# Patient Record
Sex: Male | Born: 1945
Health system: Southern US, Community
[De-identification: ages and names within clinical notes are randomized; demographics above are authoritative.]

## PROBLEM LIST (undated history)

## (undated) DIAGNOSIS — I10 Essential (primary) hypertension: Secondary | ICD-10-CM

## (undated) DIAGNOSIS — Z952 Presence of prosthetic heart valve: Secondary | ICD-10-CM

## (undated) DIAGNOSIS — Z9889 Other specified postprocedural states: Secondary | ICD-10-CM

## (undated) DIAGNOSIS — K219 Gastro-esophageal reflux disease without esophagitis: Secondary | ICD-10-CM

## (undated) DIAGNOSIS — E785 Hyperlipidemia, unspecified: Secondary | ICD-10-CM

## (undated) DIAGNOSIS — N400 Enlarged prostate without lower urinary tract symptoms: Secondary | ICD-10-CM

## (undated) DIAGNOSIS — N189 Chronic kidney disease, unspecified: Secondary | ICD-10-CM

## (undated) DIAGNOSIS — R270 Ataxia, unspecified: Secondary | ICD-10-CM

## (undated) DIAGNOSIS — H8102 Meniere's disease, left ear: Secondary | ICD-10-CM

## (undated) DIAGNOSIS — F5104 Psychophysiologic insomnia: Secondary | ICD-10-CM

## (undated) DIAGNOSIS — F32A Depression, unspecified: Secondary | ICD-10-CM

## (undated) DIAGNOSIS — G459 Transient cerebral ischemic attack, unspecified: Secondary | ICD-10-CM

## (undated) DIAGNOSIS — R413 Other amnesia: Secondary | ICD-10-CM

## (undated) DIAGNOSIS — I4719 Other supraventricular tachycardia: Secondary | ICD-10-CM

## (undated) DIAGNOSIS — R42 Dizziness and giddiness: Secondary | ICD-10-CM

## (undated) DIAGNOSIS — I471 Supraventricular tachycardia: Secondary | ICD-10-CM

## (undated) DIAGNOSIS — I499 Cardiac arrhythmia, unspecified: Secondary | ICD-10-CM

## (undated) HISTORY — DX: Supraventricular tachycardia: I47.1

## (undated) HISTORY — DX: Psychophysiologic insomnia: F51.04

## (undated) HISTORY — PX: CARDIAC VALVE REPLACEMENT: SHX585

## (undated) HISTORY — DX: Other amnesia: R41.3

## (undated) HISTORY — DX: Ataxia, unspecified: R27.0

## (undated) HISTORY — PX: CHOLECYSTECTOMY: SHX55

## (undated) HISTORY — DX: Other supraventricular tachycardia: I47.19

## (undated) HISTORY — PX: ABLATION OF DYSRHYTHMIC FOCUS: SHX254

## (undated) HISTORY — DX: Meniere's disease, left ear: H81.02

## (undated) HISTORY — PX: KIDNEY STONE SURGERY: SHX686

## (undated) HISTORY — DX: Transient cerebral ischemic attack, unspecified: G45.9

## (undated) HISTORY — DX: Chronic kidney disease, unspecified: N18.9

## (undated) HISTORY — PX: KNEE ARTHROSCOPY: SUR90

## (undated) HISTORY — DX: Depression, unspecified: F32.A

---

## 2008-09-13 DIAGNOSIS — Z952 Presence of prosthetic heart valve: Secondary | ICD-10-CM

## 2008-09-13 HISTORY — DX: Presence of prosthetic heart valve: Z95.2

## 2009-07-16 ENCOUNTER — Ambulatory Visit: Payer: Self-pay | Admitting: Surgery

## 2009-07-29 ENCOUNTER — Ambulatory Visit: Payer: Self-pay | Admitting: Surgery

## 2014-09-12 DIAGNOSIS — J343 Hypertrophy of nasal turbinates: Secondary | ICD-10-CM

## 2014-09-12 DIAGNOSIS — J342 Deviated nasal septum: Secondary | ICD-10-CM | POA: Insufficient documentation

## 2014-09-12 DIAGNOSIS — J321 Chronic frontal sinusitis: Secondary | ICD-10-CM

## 2014-09-12 DIAGNOSIS — J323 Chronic sphenoidal sinusitis: Secondary | ICD-10-CM | POA: Insufficient documentation

## 2014-09-12 DIAGNOSIS — J339 Nasal polyp, unspecified: Secondary | ICD-10-CM

## 2014-09-12 HISTORY — DX: Hypertrophy of nasal turbinates: J34.3

## 2014-09-12 HISTORY — DX: Deviated nasal septum: J34.2

## 2014-09-12 HISTORY — DX: Chronic sphenoidal sinusitis: J32.3

## 2014-09-12 HISTORY — DX: Nasal polyp, unspecified: J33.9

## 2014-09-12 HISTORY — DX: Chronic frontal sinusitis: J32.1

## 2015-03-18 DIAGNOSIS — Z9889 Other specified postprocedural states: Secondary | ICD-10-CM

## 2015-03-18 DIAGNOSIS — I10 Essential (primary) hypertension: Secondary | ICD-10-CM

## 2015-03-18 DIAGNOSIS — E785 Hyperlipidemia, unspecified: Secondary | ICD-10-CM | POA: Insufficient documentation

## 2015-03-18 DIAGNOSIS — Z8679 Personal history of other diseases of the circulatory system: Secondary | ICD-10-CM

## 2015-03-18 DIAGNOSIS — I480 Paroxysmal atrial fibrillation: Secondary | ICD-10-CM | POA: Insufficient documentation

## 2015-03-18 HISTORY — DX: Other specified postprocedural states: Z98.890

## 2015-03-18 HISTORY — DX: Essential (primary) hypertension: I10

## 2015-03-18 HISTORY — DX: Personal history of other diseases of the circulatory system: Z86.79

## 2015-03-18 HISTORY — DX: Hyperlipidemia, unspecified: E78.5

## 2015-09-30 DIAGNOSIS — I480 Paroxysmal atrial fibrillation: Secondary | ICD-10-CM | POA: Diagnosis not present

## 2015-09-30 DIAGNOSIS — I10 Essential (primary) hypertension: Secondary | ICD-10-CM | POA: Diagnosis not present

## 2015-09-30 DIAGNOSIS — E785 Hyperlipidemia, unspecified: Secondary | ICD-10-CM | POA: Diagnosis not present

## 2015-09-30 DIAGNOSIS — Z952 Presence of prosthetic heart valve: Secondary | ICD-10-CM | POA: Diagnosis not present

## 2016-01-05 DIAGNOSIS — F5104 Psychophysiologic insomnia: Secondary | ICD-10-CM | POA: Diagnosis not present

## 2016-01-05 DIAGNOSIS — I10 Essential (primary) hypertension: Secondary | ICD-10-CM | POA: Diagnosis not present

## 2016-01-05 DIAGNOSIS — G2581 Restless legs syndrome: Secondary | ICD-10-CM | POA: Diagnosis not present

## 2016-01-05 DIAGNOSIS — R972 Elevated prostate specific antigen [PSA]: Secondary | ICD-10-CM | POA: Diagnosis not present

## 2016-01-05 DIAGNOSIS — I480 Paroxysmal atrial fibrillation: Secondary | ICD-10-CM | POA: Diagnosis not present

## 2016-01-05 DIAGNOSIS — E785 Hyperlipidemia, unspecified: Secondary | ICD-10-CM | POA: Diagnosis not present

## 2016-01-05 DIAGNOSIS — E663 Overweight: Secondary | ICD-10-CM | POA: Diagnosis not present

## 2016-01-05 DIAGNOSIS — Z79899 Other long term (current) drug therapy: Secondary | ICD-10-CM | POA: Diagnosis not present

## 2016-01-05 DIAGNOSIS — M170 Bilateral primary osteoarthritis of knee: Secondary | ICD-10-CM | POA: Diagnosis not present

## 2016-01-05 DIAGNOSIS — Z6829 Body mass index (BMI) 29.0-29.9, adult: Secondary | ICD-10-CM | POA: Diagnosis not present

## 2016-03-12 DIAGNOSIS — Z6829 Body mass index (BMI) 29.0-29.9, adult: Secondary | ICD-10-CM | POA: Diagnosis not present

## 2016-03-12 DIAGNOSIS — J309 Allergic rhinitis, unspecified: Secondary | ICD-10-CM | POA: Diagnosis not present

## 2016-03-12 DIAGNOSIS — I10 Essential (primary) hypertension: Secondary | ICD-10-CM | POA: Diagnosis not present

## 2016-03-29 DIAGNOSIS — I480 Paroxysmal atrial fibrillation: Secondary | ICD-10-CM | POA: Diagnosis not present

## 2016-03-29 DIAGNOSIS — I10 Essential (primary) hypertension: Secondary | ICD-10-CM | POA: Diagnosis not present

## 2016-03-29 DIAGNOSIS — Z952 Presence of prosthetic heart valve: Secondary | ICD-10-CM | POA: Diagnosis not present

## 2016-03-29 DIAGNOSIS — E785 Hyperlipidemia, unspecified: Secondary | ICD-10-CM | POA: Diagnosis not present

## 2016-05-27 DIAGNOSIS — H2513 Age-related nuclear cataract, bilateral: Secondary | ICD-10-CM | POA: Diagnosis not present

## 2016-05-27 DIAGNOSIS — H524 Presbyopia: Secondary | ICD-10-CM | POA: Diagnosis not present

## 2016-07-07 DIAGNOSIS — R972 Elevated prostate specific antigen [PSA]: Secondary | ICD-10-CM | POA: Diagnosis not present

## 2016-07-07 DIAGNOSIS — E785 Hyperlipidemia, unspecified: Secondary | ICD-10-CM | POA: Diagnosis not present

## 2016-07-07 DIAGNOSIS — Z1389 Encounter for screening for other disorder: Secondary | ICD-10-CM | POA: Diagnosis not present

## 2016-07-07 DIAGNOSIS — Z139 Encounter for screening, unspecified: Secondary | ICD-10-CM | POA: Diagnosis not present

## 2016-07-07 DIAGNOSIS — Z79899 Other long term (current) drug therapy: Secondary | ICD-10-CM | POA: Diagnosis not present

## 2016-07-07 DIAGNOSIS — Z9181 History of falling: Secondary | ICD-10-CM | POA: Diagnosis not present

## 2016-07-07 DIAGNOSIS — F5104 Psychophysiologic insomnia: Secondary | ICD-10-CM | POA: Diagnosis not present

## 2016-07-07 DIAGNOSIS — I10 Essential (primary) hypertension: Secondary | ICD-10-CM | POA: Diagnosis not present

## 2016-07-07 DIAGNOSIS — Z23 Encounter for immunization: Secondary | ICD-10-CM | POA: Diagnosis not present

## 2016-07-07 DIAGNOSIS — I480 Paroxysmal atrial fibrillation: Secondary | ICD-10-CM | POA: Diagnosis not present

## 2016-07-07 DIAGNOSIS — Z6829 Body mass index (BMI) 29.0-29.9, adult: Secondary | ICD-10-CM | POA: Diagnosis not present

## 2016-07-23 DIAGNOSIS — E785 Hyperlipidemia, unspecified: Secondary | ICD-10-CM | POA: Diagnosis not present

## 2016-07-23 DIAGNOSIS — I480 Paroxysmal atrial fibrillation: Secondary | ICD-10-CM | POA: Diagnosis not present

## 2016-07-23 DIAGNOSIS — I10 Essential (primary) hypertension: Secondary | ICD-10-CM | POA: Diagnosis not present

## 2016-07-23 DIAGNOSIS — R072 Precordial pain: Secondary | ICD-10-CM | POA: Diagnosis not present

## 2016-07-23 DIAGNOSIS — K219 Gastro-esophageal reflux disease without esophagitis: Secondary | ICD-10-CM | POA: Diagnosis not present

## 2016-07-23 DIAGNOSIS — Z952 Presence of prosthetic heart valve: Secondary | ICD-10-CM | POA: Diagnosis not present

## 2016-07-23 DIAGNOSIS — Z7902 Long term (current) use of antithrombotics/antiplatelets: Secondary | ICD-10-CM | POA: Diagnosis not present

## 2016-07-23 DIAGNOSIS — I482 Chronic atrial fibrillation: Secondary | ICD-10-CM | POA: Diagnosis not present

## 2016-07-23 DIAGNOSIS — I252 Old myocardial infarction: Secondary | ICD-10-CM | POA: Diagnosis not present

## 2016-07-23 DIAGNOSIS — R0789 Other chest pain: Secondary | ICD-10-CM | POA: Diagnosis not present

## 2016-07-23 DIAGNOSIS — Z951 Presence of aortocoronary bypass graft: Secondary | ICD-10-CM | POA: Diagnosis not present

## 2016-07-23 DIAGNOSIS — Z79899 Other long term (current) drug therapy: Secondary | ICD-10-CM | POA: Diagnosis not present

## 2016-07-23 DIAGNOSIS — R079 Chest pain, unspecified: Secondary | ICD-10-CM | POA: Diagnosis not present

## 2016-07-24 DIAGNOSIS — I10 Essential (primary) hypertension: Secondary | ICD-10-CM

## 2016-07-24 DIAGNOSIS — I480 Paroxysmal atrial fibrillation: Secondary | ICD-10-CM | POA: Diagnosis not present

## 2016-07-24 DIAGNOSIS — I482 Chronic atrial fibrillation: Secondary | ICD-10-CM | POA: Diagnosis not present

## 2016-07-24 DIAGNOSIS — R0789 Other chest pain: Secondary | ICD-10-CM | POA: Diagnosis not present

## 2016-07-24 DIAGNOSIS — E785 Hyperlipidemia, unspecified: Secondary | ICD-10-CM

## 2016-07-24 DIAGNOSIS — Z952 Presence of prosthetic heart valve: Secondary | ICD-10-CM | POA: Diagnosis not present

## 2016-07-24 DIAGNOSIS — Z951 Presence of aortocoronary bypass graft: Secondary | ICD-10-CM | POA: Diagnosis not present

## 2016-07-24 DIAGNOSIS — E784 Other hyperlipidemia: Secondary | ICD-10-CM | POA: Diagnosis not present

## 2016-07-24 DIAGNOSIS — I119 Hypertensive heart disease without heart failure: Secondary | ICD-10-CM | POA: Diagnosis not present

## 2016-07-24 DIAGNOSIS — R079 Chest pain, unspecified: Secondary | ICD-10-CM | POA: Diagnosis not present

## 2016-07-24 DIAGNOSIS — Z8249 Family history of ischemic heart disease and other diseases of the circulatory system: Secondary | ICD-10-CM | POA: Diagnosis not present

## 2016-07-25 DIAGNOSIS — Z952 Presence of prosthetic heart valve: Secondary | ICD-10-CM | POA: Diagnosis not present

## 2016-07-25 DIAGNOSIS — Z951 Presence of aortocoronary bypass graft: Secondary | ICD-10-CM | POA: Diagnosis not present

## 2016-07-25 DIAGNOSIS — R0789 Other chest pain: Secondary | ICD-10-CM | POA: Diagnosis not present

## 2016-07-25 DIAGNOSIS — I482 Chronic atrial fibrillation: Secondary | ICD-10-CM | POA: Diagnosis not present

## 2016-07-26 DIAGNOSIS — E784 Other hyperlipidemia: Secondary | ICD-10-CM | POA: Diagnosis not present

## 2016-07-26 DIAGNOSIS — Z79899 Other long term (current) drug therapy: Secondary | ICD-10-CM | POA: Diagnosis not present

## 2016-07-26 DIAGNOSIS — Z9049 Acquired absence of other specified parts of digestive tract: Secondary | ICD-10-CM | POA: Diagnosis not present

## 2016-07-26 DIAGNOSIS — I480 Paroxysmal atrial fibrillation: Secondary | ICD-10-CM | POA: Diagnosis not present

## 2016-07-26 DIAGNOSIS — I4891 Unspecified atrial fibrillation: Secondary | ICD-10-CM | POA: Diagnosis not present

## 2016-07-26 DIAGNOSIS — I119 Hypertensive heart disease without heart failure: Secondary | ICD-10-CM | POA: Diagnosis not present

## 2016-07-26 DIAGNOSIS — I481 Persistent atrial fibrillation: Secondary | ICD-10-CM | POA: Diagnosis not present

## 2016-07-26 DIAGNOSIS — Z954 Presence of other heart-valve replacement: Secondary | ICD-10-CM | POA: Diagnosis not present

## 2016-07-26 DIAGNOSIS — Z743 Need for continuous supervision: Secondary | ICD-10-CM | POA: Diagnosis not present

## 2016-07-26 DIAGNOSIS — Z7902 Long term (current) use of antithrombotics/antiplatelets: Secondary | ICD-10-CM | POA: Diagnosis not present

## 2016-07-26 DIAGNOSIS — I10 Essential (primary) hypertension: Secondary | ICD-10-CM | POA: Diagnosis not present

## 2016-07-26 DIAGNOSIS — R279 Unspecified lack of coordination: Secondary | ICD-10-CM | POA: Diagnosis not present

## 2016-07-26 DIAGNOSIS — Z952 Presence of prosthetic heart valve: Secondary | ICD-10-CM | POA: Diagnosis not present

## 2016-07-26 DIAGNOSIS — R0789 Other chest pain: Secondary | ICD-10-CM | POA: Diagnosis not present

## 2016-07-26 DIAGNOSIS — I4902 Ventricular flutter: Secondary | ICD-10-CM | POA: Diagnosis not present

## 2016-07-26 DIAGNOSIS — I484 Atypical atrial flutter: Secondary | ICD-10-CM | POA: Diagnosis not present

## 2016-07-26 DIAGNOSIS — I482 Chronic atrial fibrillation: Secondary | ICD-10-CM | POA: Diagnosis not present

## 2016-07-26 DIAGNOSIS — I471 Supraventricular tachycardia: Secondary | ICD-10-CM | POA: Diagnosis not present

## 2016-07-26 DIAGNOSIS — E785 Hyperlipidemia, unspecified: Secondary | ICD-10-CM | POA: Diagnosis not present

## 2016-07-26 DIAGNOSIS — Z951 Presence of aortocoronary bypass graft: Secondary | ICD-10-CM | POA: Diagnosis not present

## 2016-07-26 DIAGNOSIS — R079 Chest pain, unspecified: Secondary | ICD-10-CM | POA: Diagnosis not present

## 2016-07-26 DIAGNOSIS — Z9889 Other specified postprocedural states: Secondary | ICD-10-CM | POA: Diagnosis not present

## 2016-07-27 DIAGNOSIS — I471 Supraventricular tachycardia: Secondary | ICD-10-CM | POA: Diagnosis not present

## 2016-07-27 DIAGNOSIS — Z7902 Long term (current) use of antithrombotics/antiplatelets: Secondary | ICD-10-CM | POA: Diagnosis not present

## 2016-07-27 DIAGNOSIS — I484 Atypical atrial flutter: Secondary | ICD-10-CM | POA: Diagnosis not present

## 2016-07-27 DIAGNOSIS — I10 Essential (primary) hypertension: Secondary | ICD-10-CM | POA: Diagnosis not present

## 2016-07-27 DIAGNOSIS — E785 Hyperlipidemia, unspecified: Secondary | ICD-10-CM | POA: Diagnosis not present

## 2016-07-27 DIAGNOSIS — Z9889 Other specified postprocedural states: Secondary | ICD-10-CM | POA: Diagnosis not present

## 2016-07-27 DIAGNOSIS — Z952 Presence of prosthetic heart valve: Secondary | ICD-10-CM | POA: Diagnosis not present

## 2016-07-27 DIAGNOSIS — Z9049 Acquired absence of other specified parts of digestive tract: Secondary | ICD-10-CM | POA: Diagnosis not present

## 2016-07-27 DIAGNOSIS — R079 Chest pain, unspecified: Secondary | ICD-10-CM | POA: Diagnosis not present

## 2016-07-27 DIAGNOSIS — I4902 Ventricular flutter: Secondary | ICD-10-CM | POA: Diagnosis not present

## 2016-07-27 DIAGNOSIS — Z79899 Other long term (current) drug therapy: Secondary | ICD-10-CM | POA: Diagnosis not present

## 2016-07-27 DIAGNOSIS — I4891 Unspecified atrial fibrillation: Secondary | ICD-10-CM | POA: Diagnosis not present

## 2016-07-28 DIAGNOSIS — I119 Hypertensive heart disease without heart failure: Secondary | ICD-10-CM | POA: Diagnosis not present

## 2016-07-28 DIAGNOSIS — I4891 Unspecified atrial fibrillation: Secondary | ICD-10-CM | POA: Diagnosis not present

## 2016-07-28 DIAGNOSIS — I471 Supraventricular tachycardia: Secondary | ICD-10-CM | POA: Diagnosis not present

## 2016-07-28 DIAGNOSIS — I358 Other nonrheumatic aortic valve disorders: Secondary | ICD-10-CM | POA: Diagnosis not present

## 2016-07-28 DIAGNOSIS — Z954 Presence of other heart-valve replacement: Secondary | ICD-10-CM | POA: Diagnosis not present

## 2016-07-28 DIAGNOSIS — E784 Other hyperlipidemia: Secondary | ICD-10-CM | POA: Diagnosis not present

## 2016-07-28 DIAGNOSIS — R000 Tachycardia, unspecified: Secondary | ICD-10-CM | POA: Diagnosis not present

## 2016-08-01 DIAGNOSIS — R5383 Other fatigue: Secondary | ICD-10-CM | POA: Diagnosis not present

## 2016-08-01 DIAGNOSIS — I480 Paroxysmal atrial fibrillation: Secondary | ICD-10-CM | POA: Diagnosis not present

## 2016-08-01 DIAGNOSIS — R0789 Other chest pain: Secondary | ICD-10-CM | POA: Diagnosis not present

## 2016-08-01 DIAGNOSIS — R001 Bradycardia, unspecified: Secondary | ICD-10-CM | POA: Diagnosis not present

## 2016-08-01 DIAGNOSIS — R002 Palpitations: Secondary | ICD-10-CM | POA: Diagnosis not present

## 2016-08-01 DIAGNOSIS — R079 Chest pain, unspecified: Secondary | ICD-10-CM | POA: Diagnosis not present

## 2016-08-01 DIAGNOSIS — Z952 Presence of prosthetic heart valve: Secondary | ICD-10-CM | POA: Diagnosis not present

## 2016-08-01 DIAGNOSIS — Z953 Presence of xenogenic heart valve: Secondary | ICD-10-CM | POA: Diagnosis not present

## 2016-08-01 DIAGNOSIS — E785 Hyperlipidemia, unspecified: Secondary | ICD-10-CM | POA: Diagnosis not present

## 2016-08-01 DIAGNOSIS — Z7901 Long term (current) use of anticoagulants: Secondary | ICD-10-CM | POA: Diagnosis not present

## 2016-08-01 DIAGNOSIS — I10 Essential (primary) hypertension: Secondary | ICD-10-CM | POA: Diagnosis not present

## 2016-08-01 DIAGNOSIS — Z79899 Other long term (current) drug therapy: Secondary | ICD-10-CM | POA: Diagnosis not present

## 2016-08-01 DIAGNOSIS — I4891 Unspecified atrial fibrillation: Secondary | ICD-10-CM | POA: Diagnosis not present

## 2016-08-01 DIAGNOSIS — R748 Abnormal levels of other serum enzymes: Secondary | ICD-10-CM | POA: Diagnosis not present

## 2016-08-01 DIAGNOSIS — R7989 Other specified abnormal findings of blood chemistry: Secondary | ICD-10-CM | POA: Diagnosis not present

## 2016-08-01 DIAGNOSIS — I471 Supraventricular tachycardia: Secondary | ICD-10-CM | POA: Diagnosis not present

## 2016-08-02 DIAGNOSIS — I480 Paroxysmal atrial fibrillation: Secondary | ICD-10-CM | POA: Diagnosis not present

## 2016-08-02 DIAGNOSIS — R079 Chest pain, unspecified: Secondary | ICD-10-CM | POA: Diagnosis not present

## 2016-08-02 DIAGNOSIS — I4891 Unspecified atrial fibrillation: Secondary | ICD-10-CM | POA: Diagnosis not present

## 2016-08-02 DIAGNOSIS — I10 Essential (primary) hypertension: Secondary | ICD-10-CM | POA: Diagnosis not present

## 2016-08-02 DIAGNOSIS — E785 Hyperlipidemia, unspecified: Secondary | ICD-10-CM | POA: Diagnosis not present

## 2016-08-02 DIAGNOSIS — Z952 Presence of prosthetic heart valve: Secondary | ICD-10-CM | POA: Diagnosis not present

## 2016-08-03 DIAGNOSIS — I480 Paroxysmal atrial fibrillation: Secondary | ICD-10-CM | POA: Diagnosis not present

## 2016-08-03 DIAGNOSIS — R079 Chest pain, unspecified: Secondary | ICD-10-CM | POA: Diagnosis not present

## 2016-08-03 DIAGNOSIS — Z952 Presence of prosthetic heart valve: Secondary | ICD-10-CM | POA: Diagnosis not present

## 2016-08-03 DIAGNOSIS — I4891 Unspecified atrial fibrillation: Secondary | ICD-10-CM | POA: Diagnosis not present

## 2016-08-03 DIAGNOSIS — E785 Hyperlipidemia, unspecified: Secondary | ICD-10-CM | POA: Diagnosis not present

## 2016-08-03 DIAGNOSIS — I10 Essential (primary) hypertension: Secondary | ICD-10-CM | POA: Diagnosis not present

## 2016-08-06 DIAGNOSIS — Z952 Presence of prosthetic heart valve: Secondary | ICD-10-CM | POA: Diagnosis not present

## 2016-08-06 DIAGNOSIS — I4891 Unspecified atrial fibrillation: Secondary | ICD-10-CM | POA: Diagnosis not present

## 2016-08-06 DIAGNOSIS — R079 Chest pain, unspecified: Secondary | ICD-10-CM | POA: Diagnosis not present

## 2016-08-06 DIAGNOSIS — R0789 Other chest pain: Secondary | ICD-10-CM | POA: Diagnosis not present

## 2016-08-06 DIAGNOSIS — E876 Hypokalemia: Secondary | ICD-10-CM | POA: Diagnosis not present

## 2016-08-06 DIAGNOSIS — R072 Precordial pain: Secondary | ICD-10-CM | POA: Diagnosis not present

## 2016-08-06 DIAGNOSIS — I10 Essential (primary) hypertension: Secondary | ICD-10-CM | POA: Diagnosis not present

## 2016-08-06 DIAGNOSIS — Z79899 Other long term (current) drug therapy: Secondary | ICD-10-CM | POA: Diagnosis not present

## 2016-08-06 DIAGNOSIS — E875 Hyperkalemia: Secondary | ICD-10-CM

## 2016-08-06 DIAGNOSIS — I480 Paroxysmal atrial fibrillation: Secondary | ICD-10-CM | POA: Diagnosis not present

## 2016-08-06 DIAGNOSIS — E785 Hyperlipidemia, unspecified: Secondary | ICD-10-CM | POA: Diagnosis not present

## 2016-08-06 DIAGNOSIS — I252 Old myocardial infarction: Secondary | ICD-10-CM | POA: Diagnosis not present

## 2016-08-06 DIAGNOSIS — I358 Other nonrheumatic aortic valve disorders: Secondary | ICD-10-CM | POA: Diagnosis not present

## 2016-08-06 DIAGNOSIS — M1990 Unspecified osteoarthritis, unspecified site: Secondary | ICD-10-CM | POA: Diagnosis not present

## 2016-08-07 DIAGNOSIS — R0789 Other chest pain: Secondary | ICD-10-CM | POA: Diagnosis not present

## 2016-08-07 DIAGNOSIS — I480 Paroxysmal atrial fibrillation: Secondary | ICD-10-CM | POA: Diagnosis not present

## 2016-08-07 DIAGNOSIS — Z952 Presence of prosthetic heart valve: Secondary | ICD-10-CM | POA: Diagnosis not present

## 2016-08-07 DIAGNOSIS — R079 Chest pain, unspecified: Secondary | ICD-10-CM | POA: Diagnosis not present

## 2016-08-13 DIAGNOSIS — Z9889 Other specified postprocedural states: Secondary | ICD-10-CM

## 2016-08-13 HISTORY — DX: Other specified postprocedural states: Z98.890

## 2016-08-16 DIAGNOSIS — I10 Essential (primary) hypertension: Secondary | ICD-10-CM | POA: Diagnosis not present

## 2016-08-16 DIAGNOSIS — R001 Bradycardia, unspecified: Secondary | ICD-10-CM | POA: Diagnosis not present

## 2016-08-16 DIAGNOSIS — Z952 Presence of prosthetic heart valve: Secondary | ICD-10-CM | POA: Diagnosis not present

## 2016-08-16 DIAGNOSIS — I480 Paroxysmal atrial fibrillation: Secondary | ICD-10-CM | POA: Diagnosis not present

## 2016-08-16 DIAGNOSIS — E785 Hyperlipidemia, unspecified: Secondary | ICD-10-CM | POA: Diagnosis not present

## 2016-08-16 DIAGNOSIS — I471 Supraventricular tachycardia: Secondary | ICD-10-CM | POA: Diagnosis not present

## 2016-08-30 DIAGNOSIS — Q211 Atrial septal defect: Secondary | ICD-10-CM | POA: Diagnosis not present

## 2016-08-30 DIAGNOSIS — I358 Other nonrheumatic aortic valve disorders: Secondary | ICD-10-CM | POA: Diagnosis not present

## 2016-08-30 DIAGNOSIS — I480 Paroxysmal atrial fibrillation: Secondary | ICD-10-CM | POA: Diagnosis not present

## 2016-08-30 DIAGNOSIS — I471 Supraventricular tachycardia: Secondary | ICD-10-CM | POA: Diagnosis not present

## 2016-08-30 DIAGNOSIS — Z01818 Encounter for other preprocedural examination: Secondary | ICD-10-CM | POA: Diagnosis not present

## 2016-08-31 DIAGNOSIS — I471 Supraventricular tachycardia: Secondary | ICD-10-CM | POA: Diagnosis not present

## 2016-08-31 DIAGNOSIS — Z79899 Other long term (current) drug therapy: Secondary | ICD-10-CM | POA: Diagnosis not present

## 2016-08-31 DIAGNOSIS — I480 Paroxysmal atrial fibrillation: Secondary | ICD-10-CM | POA: Diagnosis not present

## 2016-08-31 DIAGNOSIS — E785 Hyperlipidemia, unspecified: Secondary | ICD-10-CM | POA: Diagnosis not present

## 2016-08-31 DIAGNOSIS — Z7901 Long term (current) use of anticoagulants: Secondary | ICD-10-CM | POA: Diagnosis not present

## 2016-08-31 DIAGNOSIS — I10 Essential (primary) hypertension: Secondary | ICD-10-CM | POA: Diagnosis not present

## 2016-08-31 DIAGNOSIS — R001 Bradycardia, unspecified: Secondary | ICD-10-CM | POA: Diagnosis not present

## 2016-09-01 DIAGNOSIS — I471 Supraventricular tachycardia: Secondary | ICD-10-CM | POA: Diagnosis not present

## 2016-10-07 DIAGNOSIS — I10 Essential (primary) hypertension: Secondary | ICD-10-CM | POA: Diagnosis not present

## 2016-10-07 DIAGNOSIS — Z8679 Personal history of other diseases of the circulatory system: Secondary | ICD-10-CM | POA: Diagnosis not present

## 2016-10-07 DIAGNOSIS — Z952 Presence of prosthetic heart valve: Secondary | ICD-10-CM | POA: Diagnosis not present

## 2016-10-07 DIAGNOSIS — I471 Supraventricular tachycardia: Secondary | ICD-10-CM | POA: Diagnosis not present

## 2016-10-07 DIAGNOSIS — E785 Hyperlipidemia, unspecified: Secondary | ICD-10-CM | POA: Diagnosis not present

## 2016-10-07 DIAGNOSIS — Z9889 Other specified postprocedural states: Secondary | ICD-10-CM | POA: Diagnosis not present

## 2016-11-20 DIAGNOSIS — B359 Dermatophytosis, unspecified: Secondary | ICD-10-CM | POA: Diagnosis not present

## 2016-11-20 DIAGNOSIS — J019 Acute sinusitis, unspecified: Secondary | ICD-10-CM | POA: Diagnosis not present

## 2017-01-08 DIAGNOSIS — J019 Acute sinusitis, unspecified: Secondary | ICD-10-CM | POA: Diagnosis not present

## 2017-01-08 DIAGNOSIS — Z6829 Body mass index (BMI) 29.0-29.9, adult: Secondary | ICD-10-CM | POA: Diagnosis not present

## 2017-01-08 DIAGNOSIS — F5104 Psychophysiologic insomnia: Secondary | ICD-10-CM | POA: Diagnosis not present

## 2017-01-08 DIAGNOSIS — R972 Elevated prostate specific antigen [PSA]: Secondary | ICD-10-CM | POA: Diagnosis not present

## 2017-01-08 DIAGNOSIS — E785 Hyperlipidemia, unspecified: Secondary | ICD-10-CM | POA: Diagnosis not present

## 2017-01-08 DIAGNOSIS — I480 Paroxysmal atrial fibrillation: Secondary | ICD-10-CM | POA: Diagnosis not present

## 2017-01-08 DIAGNOSIS — Z79899 Other long term (current) drug therapy: Secondary | ICD-10-CM | POA: Diagnosis not present

## 2017-01-08 DIAGNOSIS — I10 Essential (primary) hypertension: Secondary | ICD-10-CM | POA: Diagnosis not present

## 2017-01-10 DIAGNOSIS — E785 Hyperlipidemia, unspecified: Secondary | ICD-10-CM | POA: Diagnosis not present

## 2017-01-10 DIAGNOSIS — Z79899 Other long term (current) drug therapy: Secondary | ICD-10-CM | POA: Diagnosis not present

## 2017-01-10 DIAGNOSIS — R972 Elevated prostate specific antigen [PSA]: Secondary | ICD-10-CM | POA: Diagnosis not present

## 2017-01-11 DIAGNOSIS — I480 Paroxysmal atrial fibrillation: Secondary | ICD-10-CM | POA: Diagnosis not present

## 2017-01-11 DIAGNOSIS — Z8679 Personal history of other diseases of the circulatory system: Secondary | ICD-10-CM | POA: Diagnosis not present

## 2017-01-11 DIAGNOSIS — I471 Supraventricular tachycardia: Secondary | ICD-10-CM | POA: Diagnosis not present

## 2017-01-11 DIAGNOSIS — Z9889 Other specified postprocedural states: Secondary | ICD-10-CM | POA: Diagnosis not present

## 2017-01-11 DIAGNOSIS — I10 Essential (primary) hypertension: Secondary | ICD-10-CM | POA: Diagnosis not present

## 2017-01-11 DIAGNOSIS — E785 Hyperlipidemia, unspecified: Secondary | ICD-10-CM | POA: Diagnosis not present

## 2017-03-22 DIAGNOSIS — Z0000 Encounter for general adult medical examination without abnormal findings: Secondary | ICD-10-CM | POA: Diagnosis not present

## 2017-03-22 DIAGNOSIS — Z136 Encounter for screening for cardiovascular disorders: Secondary | ICD-10-CM | POA: Diagnosis not present

## 2017-03-22 DIAGNOSIS — Z9181 History of falling: Secondary | ICD-10-CM | POA: Diagnosis not present

## 2017-03-22 DIAGNOSIS — Z1389 Encounter for screening for other disorder: Secondary | ICD-10-CM | POA: Diagnosis not present

## 2017-03-22 DIAGNOSIS — Z125 Encounter for screening for malignant neoplasm of prostate: Secondary | ICD-10-CM | POA: Diagnosis not present

## 2017-03-22 DIAGNOSIS — E785 Hyperlipidemia, unspecified: Secondary | ICD-10-CM | POA: Diagnosis not present

## 2017-05-05 DIAGNOSIS — Z6829 Body mass index (BMI) 29.0-29.9, adult: Secondary | ICD-10-CM | POA: Diagnosis not present

## 2017-05-05 DIAGNOSIS — R05 Cough: Secondary | ICD-10-CM | POA: Diagnosis not present

## 2017-05-05 DIAGNOSIS — R0602 Shortness of breath: Secondary | ICD-10-CM | POA: Diagnosis not present

## 2017-05-05 DIAGNOSIS — J208 Acute bronchitis due to other specified organisms: Secondary | ICD-10-CM | POA: Diagnosis not present

## 2017-05-05 DIAGNOSIS — M159 Polyosteoarthritis, unspecified: Secondary | ICD-10-CM | POA: Diagnosis not present

## 2017-05-23 DIAGNOSIS — I10 Essential (primary) hypertension: Secondary | ICD-10-CM | POA: Diagnosis not present

## 2017-05-23 DIAGNOSIS — R413 Other amnesia: Secondary | ICD-10-CM | POA: Diagnosis not present

## 2017-05-23 DIAGNOSIS — F40240 Claustrophobia: Secondary | ICD-10-CM | POA: Diagnosis not present

## 2017-05-23 DIAGNOSIS — G459 Transient cerebral ischemic attack, unspecified: Secondary | ICD-10-CM | POA: Diagnosis not present

## 2017-05-23 DIAGNOSIS — Z6828 Body mass index (BMI) 28.0-28.9, adult: Secondary | ICD-10-CM | POA: Diagnosis not present

## 2017-05-23 DIAGNOSIS — I480 Paroxysmal atrial fibrillation: Secondary | ICD-10-CM | POA: Diagnosis not present

## 2017-05-31 DIAGNOSIS — I639 Cerebral infarction, unspecified: Secondary | ICD-10-CM | POA: Diagnosis not present

## 2017-05-31 DIAGNOSIS — G459 Transient cerebral ischemic attack, unspecified: Secondary | ICD-10-CM | POA: Diagnosis not present

## 2017-06-02 DIAGNOSIS — G459 Transient cerebral ischemic attack, unspecified: Secondary | ICD-10-CM | POA: Diagnosis not present

## 2017-06-02 DIAGNOSIS — R413 Other amnesia: Secondary | ICD-10-CM | POA: Diagnosis not present

## 2017-06-03 DIAGNOSIS — Z952 Presence of prosthetic heart valve: Secondary | ICD-10-CM | POA: Diagnosis not present

## 2017-06-03 DIAGNOSIS — R9431 Abnormal electrocardiogram [ECG] [EKG]: Secondary | ICD-10-CM | POA: Diagnosis not present

## 2017-06-03 DIAGNOSIS — E7800 Pure hypercholesterolemia, unspecified: Secondary | ICD-10-CM | POA: Insufficient documentation

## 2017-06-03 DIAGNOSIS — I4891 Unspecified atrial fibrillation: Secondary | ICD-10-CM | POA: Diagnosis not present

## 2017-06-03 DIAGNOSIS — I10 Essential (primary) hypertension: Secondary | ICD-10-CM | POA: Diagnosis not present

## 2017-06-03 DIAGNOSIS — Z9889 Other specified postprocedural states: Secondary | ICD-10-CM | POA: Diagnosis not present

## 2017-06-06 DIAGNOSIS — R0981 Nasal congestion: Secondary | ICD-10-CM | POA: Diagnosis not present

## 2017-06-06 DIAGNOSIS — E538 Deficiency of other specified B group vitamins: Secondary | ICD-10-CM | POA: Diagnosis not present

## 2017-06-06 DIAGNOSIS — R413 Other amnesia: Secondary | ICD-10-CM | POA: Diagnosis not present

## 2017-06-06 DIAGNOSIS — R5381 Other malaise: Secondary | ICD-10-CM | POA: Diagnosis not present

## 2017-06-06 DIAGNOSIS — Z6828 Body mass index (BMI) 28.0-28.9, adult: Secondary | ICD-10-CM | POA: Diagnosis not present

## 2017-06-07 DIAGNOSIS — R0982 Postnasal drip: Secondary | ICD-10-CM | POA: Diagnosis not present

## 2017-06-07 DIAGNOSIS — R42 Dizziness and giddiness: Secondary | ICD-10-CM | POA: Diagnosis not present

## 2017-06-07 DIAGNOSIS — H903 Sensorineural hearing loss, bilateral: Secondary | ICD-10-CM | POA: Diagnosis not present

## 2017-06-24 DIAGNOSIS — I517 Cardiomegaly: Secondary | ICD-10-CM | POA: Diagnosis not present

## 2017-06-24 DIAGNOSIS — I351 Nonrheumatic aortic (valve) insufficiency: Secondary | ICD-10-CM | POA: Diagnosis not present

## 2017-07-11 DIAGNOSIS — R42 Dizziness and giddiness: Secondary | ICD-10-CM | POA: Diagnosis not present

## 2017-07-14 DIAGNOSIS — E785 Hyperlipidemia, unspecified: Secondary | ICD-10-CM | POA: Diagnosis not present

## 2017-07-14 DIAGNOSIS — I480 Paroxysmal atrial fibrillation: Secondary | ICD-10-CM | POA: Diagnosis not present

## 2017-07-14 DIAGNOSIS — Z139 Encounter for screening, unspecified: Secondary | ICD-10-CM | POA: Diagnosis not present

## 2017-07-14 DIAGNOSIS — Z6829 Body mass index (BMI) 29.0-29.9, adult: Secondary | ICD-10-CM | POA: Diagnosis not present

## 2017-07-14 DIAGNOSIS — I10 Essential (primary) hypertension: Secondary | ICD-10-CM | POA: Diagnosis not present

## 2017-07-14 DIAGNOSIS — R972 Elevated prostate specific antigen [PSA]: Secondary | ICD-10-CM | POA: Diagnosis not present

## 2017-07-14 DIAGNOSIS — F5104 Psychophysiologic insomnia: Secondary | ICD-10-CM | POA: Diagnosis not present

## 2017-07-14 DIAGNOSIS — Z79899 Other long term (current) drug therapy: Secondary | ICD-10-CM | POA: Diagnosis not present

## 2017-07-14 DIAGNOSIS — Z23 Encounter for immunization: Secondary | ICD-10-CM | POA: Diagnosis not present

## 2017-07-15 ENCOUNTER — Telehealth: Payer: Self-pay

## 2017-07-15 NOTE — Telephone Encounter (Signed)
Lab results came in from pcp. Attempted to get pt in for routine follow up appt pt hasn't seen Dr. Geraldo Pitter in new office.

## 2017-07-19 DIAGNOSIS — K219 Gastro-esophageal reflux disease without esophagitis: Secondary | ICD-10-CM | POA: Diagnosis not present

## 2017-07-19 DIAGNOSIS — H903 Sensorineural hearing loss, bilateral: Secondary | ICD-10-CM | POA: Diagnosis not present

## 2017-07-19 DIAGNOSIS — R0982 Postnasal drip: Secondary | ICD-10-CM | POA: Diagnosis not present

## 2017-07-19 DIAGNOSIS — R42 Dizziness and giddiness: Secondary | ICD-10-CM | POA: Diagnosis not present

## 2017-07-22 DIAGNOSIS — N401 Enlarged prostate with lower urinary tract symptoms: Secondary | ICD-10-CM | POA: Diagnosis not present

## 2017-07-22 DIAGNOSIS — R972 Elevated prostate specific antigen [PSA]: Secondary | ICD-10-CM | POA: Diagnosis not present

## 2017-07-22 DIAGNOSIS — R21 Rash and other nonspecific skin eruption: Secondary | ICD-10-CM | POA: Diagnosis not present

## 2017-07-28 DIAGNOSIS — D2361 Other benign neoplasm of skin of right upper limb, including shoulder: Secondary | ICD-10-CM | POA: Diagnosis not present

## 2017-07-28 DIAGNOSIS — L300 Nummular dermatitis: Secondary | ICD-10-CM | POA: Diagnosis not present

## 2017-07-28 DIAGNOSIS — L570 Actinic keratosis: Secondary | ICD-10-CM | POA: Diagnosis not present

## 2017-07-29 DIAGNOSIS — Z6829 Body mass index (BMI) 29.0-29.9, adult: Secondary | ICD-10-CM | POA: Diagnosis not present

## 2017-07-29 DIAGNOSIS — I10 Essential (primary) hypertension: Secondary | ICD-10-CM | POA: Diagnosis not present

## 2017-08-05 DIAGNOSIS — Z139 Encounter for screening, unspecified: Secondary | ICD-10-CM | POA: Diagnosis not present

## 2017-08-05 DIAGNOSIS — I10 Essential (primary) hypertension: Secondary | ICD-10-CM | POA: Diagnosis not present

## 2017-08-05 DIAGNOSIS — Z6829 Body mass index (BMI) 29.0-29.9, adult: Secondary | ICD-10-CM | POA: Diagnosis not present

## 2017-08-24 ENCOUNTER — Other Ambulatory Visit: Payer: Self-pay | Admitting: *Deleted

## 2017-08-24 ENCOUNTER — Other Ambulatory Visit: Payer: Self-pay

## 2017-08-26 ENCOUNTER — Encounter: Payer: Self-pay | Admitting: Cardiology

## 2017-08-26 ENCOUNTER — Ambulatory Visit: Payer: PPO | Admitting: Cardiology

## 2017-08-26 VITALS — BP 102/60 | HR 75 | Ht 68.5 in | Wt 190.0 lb

## 2017-08-26 DIAGNOSIS — Z9889 Other specified postprocedural states: Secondary | ICD-10-CM | POA: Diagnosis not present

## 2017-08-26 DIAGNOSIS — Z952 Presence of prosthetic heart valve: Secondary | ICD-10-CM | POA: Diagnosis not present

## 2017-08-26 DIAGNOSIS — I480 Paroxysmal atrial fibrillation: Secondary | ICD-10-CM

## 2017-08-26 DIAGNOSIS — E7800 Pure hypercholesterolemia, unspecified: Secondary | ICD-10-CM

## 2017-08-26 DIAGNOSIS — I10 Essential (primary) hypertension: Secondary | ICD-10-CM

## 2017-08-26 DIAGNOSIS — Z8679 Personal history of other diseases of the circulatory system: Secondary | ICD-10-CM

## 2017-08-26 DIAGNOSIS — E785 Hyperlipidemia, unspecified: Secondary | ICD-10-CM | POA: Diagnosis not present

## 2017-08-26 NOTE — Patient Instructions (Addendum)
Medication Instructions:  Your physician has recommended you make the following change in your medication: STOP Lisinopril  Labwork: None  Testing/Procedures: You had an EKG  Follow-Up: Your physician recommends that you schedule a follow-up appointment in: 6 months  Any Other Special Instructions Will Be Listed Below (If Applicable).     If you need a refill on your cardiac medications before your next appointment, please call your pharmacy.   Frederic, RN, BSN

## 2017-08-26 NOTE — Progress Notes (Signed)
Cardiology Office Note:    Date:  08/26/2017   ID:  Randall Edwards, DOB Nov 09, 1945, MRN [id number]  PCP:  Randall Dress, MD  Cardiologist:  Randall Lindau, MD   Referring MD: Randall Dress, MD    ASSESSMENT:    1. S/P AVR (aortic valve replacement)   2. Status post radiofrequency ablation for arrhythmia   3. Hypercholesteremia   4. Dyslipidemia   5. Paroxysmal atrial fibrillation (HCC)   6. Essential hypertension    PLAN:    In order of problems listed above:  1. Secondary prevention stressed with the patient.  Importance of compliance with diet and medications stressed and he vocalized understanding.  He tells me that his primary care physician has done his blood work including lipids and they are followed by primary care. 2. Blood pressure is very borderline and on another was 90/70.  He is asymptomatic but will view of the above I will stop his lisinopril and told him to monitor his blood pressure on a regular basis.  I would presume that his ambulatory and resting blood pressure would be fine at home.  I am little cautious about keeping his blood pressure so low for fear of adverse events.  Next 3. His aortic valve clinically appears to be fine I would like to obtain the echocardiogram report and carotid Doppler report from [facility]. 4. Patient will be seen in follow-up appointment in 6 months or earlier if the patient has any concerns. I discussed my findings at length with the patient's electrophysiologist Dr. Deatra Edwards.  He feels that the patient has only atrial tachycardia and not paroxysmal atrial fibrillation.  Patient has had one sporadic episode of palpitations which have resolved with an extra dose of sotalol.  Therefore patient will continue current medications and we will send a copy of this note to his electrophysiologist for review.  Total time for this evaluation was 60 minutes.   Medication Adjustments/Labs and Tests Ordered: Current medicines are  reviewed at length with the patient today.  Concerns regarding medicines are outlined above.  No orders of the defined types were placed in this encounter.  No orders of the defined types were placed in this encounter.    Chief Complaint  Patient presents with  . Follow-up     History of Present Illness:    Randall Edwards is a 71 y.o. male.  The patient has past medical history of essential hypertension, atrial tachycardia post ablation, aortic valve replacement and dyslipidemia.  He is seen here for appointment for these issues.  This patient has been under my care in my previous practice.  He is here now to transfer his care and be established with my current practice.  He denies any palpitations.  No chest pain orthopnea or PND.  No history of syncope.  At the time of my evaluation he is alert awake oriented and in no distress.  He walks on a regular basis about 30 minutes a day.  He denies any shortness of breath.  History reviewed. No pertinent past medical history.  History reviewed. No pertinent surgical history.  Current Medications: Current Meds  Medication Sig  . acetaminophen (TYLENOL) 650 MG CR tablet Take 650 mg by mouth.  Marland Kitchen aspirin EC 81 MG tablet Take 81 mg by mouth.  Marland Kitchen lisinopril (PRINIVIL,ZESTRIL) 20 MG tablet Take 20 mg by mouth at bedtime.  Marland Kitchen omeprazole (PRILOSEC) 20 MG capsule Take 20 mg by mouth.  . rosuvastatin (CRESTOR) 10  MG tablet Take 10 mg by mouth.  . sotalol (BETAPACE) 80 MG tablet TAKE ONE-HALF TABLET BY MOUTH EVERY MORNING and nightly  . tamsulosin (FLOMAX) 0.4 MG CAPS capsule TAKE ONE CAPSULE BY MOUTH EVERY DAY ONE-HALF hour after SUPPER  . temazepam (RESTORIL) 15 MG capsule TAKE ONE CAPSULE BY MOUTH AT BEDTIME AS NEEDED  . triamterene-hydrochlorothiazide (MAXZIDE-25) 37.5-25 MG tablet Take by mouth.     Allergies:   Patient has no known allergies.   Social History   Socioeconomic History  . Marital status: Married    Spouse name: None  .  Number of children: None  . Years of education: None  . Highest education level: None  Social Needs  . Financial resource strain: None  . Food insecurity - worry: None  . Food insecurity - inability: None  . Transportation needs - medical: None  . Transportation needs - non-medical: None  Occupational History  . None  Tobacco Use  . Smoking status: Never Smoker  . Smokeless tobacco: Never Used  Substance and Sexual Activity  . Alcohol use: No    Frequency: Never  . Drug use: No  . Sexual activity: None  Other Topics Concern  . None  Social History Narrative  . None     Family History: The patient's family history is not on file.  ROS:   Please see the history of present illness.    All other systems reviewed and are negative.  EKGs/Labs/Other Studies Reviewed:    The following studies were reviewed today: The patient had EKG today which reveals sinus rhythm and nonspecific ST-T changes I requested echocardiogram and carotid Doppler reports from [facility] where he had these tests done recently.   Recent Labs: No results found for requested labs within last 8760 hours.  Recent Lipid Panel No results found for: CHOL, TRIG, HDL, CHOLHDL, VLDL, LDLCALC, LDLDIRECT  Physical Exam:    VS:  BP 102/60 (BP Location: Right Arm, Patient Position: Sitting, Cuff Size: Normal)   Pulse 75   Ht 5' 8.5" (1.74 m)   Wt 190 lb (86.2 kg)   SpO2 96%   BMI 28.47 kg/m     Wt Readings from Last 3 Encounters:  08/26/17 190 lb (86.2 kg)     GEN: Patient is in no acute distress HEENT: Normal NECK: No JVD; No carotid bruits LYMPHATICS: No lymphadenopathy CARDIAC: Hear sounds regular, 2/6 systolic murmur at the apex. RESPIRATORY:  Clear to auscultation without rales, wheezing or rhonchi  ABDOMEN: Soft, non-tender, non-distended MUSCULOSKELETAL:  No edema; No deformity  SKIN: Warm and dry NEUROLOGIC:  Alert and oriented x 3 PSYCHIATRIC:  Normal affect   Signed, Randall Lindau, MD  08/26/2017 9:13 AM    [place]

## 2017-08-26 NOTE — Addendum Note (Signed)
Addended by: Mattie Marlin on: 08/26/2017 09:37 AM   Modules accepted: Orders

## 2017-09-02 DIAGNOSIS — I10 Essential (primary) hypertension: Secondary | ICD-10-CM | POA: Diagnosis not present

## 2017-09-02 DIAGNOSIS — R972 Elevated prostate specific antigen [PSA]: Secondary | ICD-10-CM | POA: Diagnosis not present

## 2017-09-02 DIAGNOSIS — Z139 Encounter for screening, unspecified: Secondary | ICD-10-CM | POA: Diagnosis not present

## 2017-09-12 ENCOUNTER — Other Ambulatory Visit: Payer: Self-pay

## 2017-09-16 DIAGNOSIS — R972 Elevated prostate specific antigen [PSA]: Secondary | ICD-10-CM | POA: Diagnosis not present

## 2017-09-16 DIAGNOSIS — N3020 Other chronic cystitis without hematuria: Secondary | ICD-10-CM | POA: Diagnosis not present

## 2017-09-19 ENCOUNTER — Telehealth: Payer: Self-pay | Admitting: Cardiology

## 2017-09-19 ENCOUNTER — Other Ambulatory Visit: Payer: Self-pay

## 2017-09-19 MED ORDER — SOTALOL HCL 80 MG PO TABS: 80.0000 mg | ORAL_TABLET | Freq: Every day | ORAL | 1 refills | Status: DC

## 2017-09-19 NOTE — Telephone Encounter (Signed)
°*  STAT* If patient is at the pharmacy, call can be transferred to refill team.   1. Which medications need to be refilled? (please list name of each medication and dose if known) Sotalol 80mg  takes 1/2 am and pm  2. Which pharmacy/location (including street and city if local pharmacy) is medication to be sent to? [facility] in [place]  3. Do they need a 30 day or 90 day supply? 46  Randall Edwards prescribed after ablation several year s ago... He needs refills and is almost out!

## 2017-09-19 NOTE — Telephone Encounter (Signed)
Med refill sent

## 2017-10-11 ENCOUNTER — Telehealth: Payer: Self-pay | Admitting: Cardiology

## 2017-10-11 NOTE — Telephone Encounter (Signed)
Patient is having a tooth pulled and wants to know if he can take Ibuprofen with the medicines  that he is taking. Please call.Marland Kitchen

## 2017-10-11 NOTE — Telephone Encounter (Signed)
Informed patient that he should be able to take the medication in the short term; requested that he double check with his dentist.

## 2017-10-12 DIAGNOSIS — H524 Presbyopia: Secondary | ICD-10-CM | POA: Diagnosis not present

## 2017-10-12 DIAGNOSIS — H2513 Age-related nuclear cataract, bilateral: Secondary | ICD-10-CM | POA: Diagnosis not present

## 2017-12-07 DIAGNOSIS — M6740 Ganglion, unspecified site: Secondary | ICD-10-CM | POA: Diagnosis not present

## 2017-12-07 DIAGNOSIS — M79641 Pain in right hand: Secondary | ICD-10-CM | POA: Diagnosis not present

## 2017-12-07 DIAGNOSIS — M19041 Primary osteoarthritis, right hand: Secondary | ICD-10-CM | POA: Diagnosis not present

## 2017-12-08 ENCOUNTER — Other Ambulatory Visit: Payer: Self-pay | Admitting: Orthopedic Surgery

## 2017-12-09 DIAGNOSIS — J208 Acute bronchitis due to other specified organisms: Secondary | ICD-10-CM | POA: Diagnosis not present

## 2017-12-09 DIAGNOSIS — Z6829 Body mass index (BMI) 29.0-29.9, adult: Secondary | ICD-10-CM | POA: Diagnosis not present

## 2017-12-15 ENCOUNTER — Other Ambulatory Visit: Payer: Self-pay | Admitting: *Deleted

## 2017-12-15 MED ORDER — ROSUVASTATIN CALCIUM 10 MG PO TABS: 10.0000 mg | ORAL_TABLET | Freq: Every evening | ORAL | 3 refills | Status: DC

## 2017-12-16 ENCOUNTER — Encounter (HOSPITAL_BASED_OUTPATIENT_CLINIC_OR_DEPARTMENT_OTHER): Payer: Self-pay | Admitting: *Deleted

## 2017-12-16 ENCOUNTER — Other Ambulatory Visit: Payer: Self-pay

## 2017-12-26 ENCOUNTER — Other Ambulatory Visit: Payer: Self-pay

## 2017-12-26 ENCOUNTER — Ambulatory Visit (HOSPITAL_BASED_OUTPATIENT_CLINIC_OR_DEPARTMENT_OTHER)
Admission: RE | Admit: 2017-12-26 | Discharge: 2017-12-26 | Disposition: A | Discharge status: Home / Self Care | Payer: PPO | Source: Ambulatory Visit | Attending: Orthopedic Surgery | Admitting: Orthopedic Surgery

## 2017-12-26 ENCOUNTER — Ambulatory Visit (HOSPITAL_BASED_OUTPATIENT_CLINIC_OR_DEPARTMENT_OTHER): Payer: PPO | Admitting: Certified Registered"

## 2017-12-26 ENCOUNTER — Encounter (HOSPITAL_BASED_OUTPATIENT_CLINIC_OR_DEPARTMENT_OTHER)
Admission: RE | Disposition: A | Discharge status: Home / Self Care | Payer: Self-pay | Source: Ambulatory Visit | Attending: Orthopedic Surgery

## 2017-12-26 ENCOUNTER — Encounter (HOSPITAL_BASED_OUTPATIENT_CLINIC_OR_DEPARTMENT_OTHER): Payer: Self-pay | Admitting: Emergency Medicine

## 2017-12-26 DIAGNOSIS — E785 Hyperlipidemia, unspecified: Secondary | ICD-10-CM | POA: Diagnosis not present

## 2017-12-26 DIAGNOSIS — M19041 Primary osteoarthritis, right hand: Secondary | ICD-10-CM | POA: Diagnosis not present

## 2017-12-26 DIAGNOSIS — M67441 Ganglion, right hand: Secondary | ICD-10-CM | POA: Diagnosis not present

## 2017-12-26 DIAGNOSIS — M85641 Other cyst of bone, right hand: Secondary | ICD-10-CM | POA: Diagnosis not present

## 2017-12-26 DIAGNOSIS — I480 Paroxysmal atrial fibrillation: Secondary | ICD-10-CM | POA: Diagnosis not present

## 2017-12-26 DIAGNOSIS — I10 Essential (primary) hypertension: Secondary | ICD-10-CM | POA: Diagnosis not present

## 2017-12-26 DIAGNOSIS — Z7982 Long term (current) use of aspirin: Secondary | ICD-10-CM | POA: Insufficient documentation

## 2017-12-26 DIAGNOSIS — M25741 Osteophyte, right hand: Secondary | ICD-10-CM | POA: Diagnosis not present

## 2017-12-26 DIAGNOSIS — Z79899 Other long term (current) drug therapy: Secondary | ICD-10-CM | POA: Insufficient documentation

## 2017-12-26 DIAGNOSIS — M71349 Other bursal cyst, unspecified hand: Secondary | ICD-10-CM | POA: Diagnosis present

## 2017-12-26 DIAGNOSIS — K219 Gastro-esophageal reflux disease without esophagitis: Secondary | ICD-10-CM | POA: Diagnosis not present

## 2017-12-26 HISTORY — DX: Presence of prosthetic heart valve: Z95.2

## 2017-12-26 HISTORY — PX: MASS EXCISION: SHX2000

## 2017-12-26 HISTORY — DX: Gastro-esophageal reflux disease without esophagitis: K21.9

## 2017-12-26 HISTORY — DX: Essential (primary) hypertension: I10

## 2017-12-26 HISTORY — DX: Cardiac arrhythmia, unspecified: I49.9

## 2017-12-26 HISTORY — DX: Hyperlipidemia, unspecified: E78.5

## 2017-12-26 HISTORY — DX: Other specified postprocedural states: Z98.890

## 2017-12-26 LAB — POCT I-STAT, CHEM 8
BUN: 20 mg/dL (ref 6–20)
CALCIUM ION: 0.97 mmol/L — AB (ref 1.15–1.40)
CHLORIDE: 110 mmol/L (ref 101–111)
CREATININE: 0.8 mg/dL (ref 0.61–1.24)
GLUCOSE: 89 mg/dL (ref 65–99)
HCT: 49 % (ref 39.0–52.0)
Hemoglobin: 16.7 g/dL (ref 13.0–17.0)
POTASSIUM: 3.9 mmol/L (ref 3.5–5.1)
Sodium: 140 mmol/L (ref 135–145)
TCO2: 26 mmol/L (ref 22–32)

## 2017-12-26 SURGERY — EXCISION MASS
Anesthesia: Regional | Site: Finger | Laterality: Right

## 2017-12-26 MED ORDER — ONDANSETRON HCL 4 MG/2ML IJ SOLN
INTRAMUSCULAR | Status: DC | PRN
  Administered 2017-12-26: 4 mg via INTRAVENOUS

## 2017-12-26 MED ORDER — BUPIVACAINE HCL (PF) 0.25 % IJ SOLN
INTRAMUSCULAR | Status: DC | PRN
  Administered 2017-12-26: 9 mL

## 2017-12-26 MED ORDER — DEXAMETHASONE SODIUM PHOSPHATE 10 MG/ML IJ SOLN
INTRAMUSCULAR | Status: AC
  Filled 2017-12-26: qty 1

## 2017-12-26 MED ORDER — MIDAZOLAM HCL 2 MG/2ML IJ SOLN
INTRAMUSCULAR | Status: AC
  Filled 2017-12-26: qty 2

## 2017-12-26 MED ORDER — LIDOCAINE HCL (PF) 0.5 % IJ SOLN
INTRAMUSCULAR | Status: DC | PRN
  Administered 2017-12-26: 30 mL via INTRAVENOUS

## 2017-12-26 MED ORDER — CHLORHEXIDINE GLUCONATE 4 % EX LIQD: 60.0000 mL | Freq: Once | CUTANEOUS | Status: DC

## 2017-12-26 MED ORDER — PROMETHAZINE HCL 25 MG/ML IJ SOLN: 6.2500 mg | INTRAMUSCULAR | Status: DC | PRN

## 2017-12-26 MED ORDER — FENTANYL CITRATE (PF) 100 MCG/2ML IJ SOLN
50.0000 ug | INTRAMUSCULAR | Status: DC | PRN
  Administered 2017-12-26 (×2): 50 ug via INTRAVENOUS

## 2017-12-26 MED ORDER — CEFAZOLIN SODIUM-DEXTROSE 2-4 GM/100ML-% IV SOLN: 2.0000 g | INTRAVENOUS | Status: DC

## 2017-12-26 MED ORDER — PROPOFOL 500 MG/50ML IV EMUL
INTRAVENOUS | Status: DC | PRN
  Administered 2017-12-26: 50 ug/kg/min via INTRAVENOUS

## 2017-12-26 MED ORDER — SCOPOLAMINE 1 MG/3DAYS TD PT72: 1.0000 | MEDICATED_PATCH | Freq: Once | TRANSDERMAL | Status: DC | PRN

## 2017-12-26 MED ORDER — HYDROMORPHONE HCL 1 MG/ML IJ SOLN: 0.2500 mg | INTRAMUSCULAR | Status: DC | PRN

## 2017-12-26 MED ORDER — LACTATED RINGERS IV SOLN
INTRAVENOUS | Status: DC
  Administered 2017-12-26: 14:00:00 via INTRAVENOUS

## 2017-12-26 MED ORDER — OXYCODONE HCL 5 MG PO TABS: 5.0000 mg | ORAL_TABLET | Freq: Once | ORAL | Status: DC | PRN

## 2017-12-26 MED ORDER — ONDANSETRON HCL 4 MG/2ML IJ SOLN
INTRAMUSCULAR | Status: AC
  Filled 2017-12-26: qty 2

## 2017-12-26 MED ORDER — FENTANYL CITRATE (PF) 100 MCG/2ML IJ SOLN
INTRAMUSCULAR | Status: AC
  Filled 2017-12-26: qty 2

## 2017-12-26 MED ORDER — OXYCODONE HCL 5 MG/5ML PO SOLN: 5.0000 mg | Freq: Once | ORAL | Status: DC | PRN

## 2017-12-26 MED ORDER — CEFAZOLIN SODIUM-DEXTROSE 2-4 GM/100ML-% IV SOLN
INTRAVENOUS | Status: AC
  Filled 2017-12-26: qty 100

## 2017-12-26 MED ORDER — HYDROCODONE-ACETAMINOPHEN 5-325 MG PO TABS: ORAL_TABLET | ORAL | 0 refills | Status: DC

## 2017-12-26 MED ORDER — MIDAZOLAM HCL 2 MG/2ML IJ SOLN
1.0000 mg | INTRAMUSCULAR | Status: DC | PRN
  Administered 2017-12-26 (×2): 1 mg via INTRAVENOUS

## 2017-12-26 SURGICAL SUPPLY — 53 items
BANDAGE ACE 3X5.8 VEL STRL LF (GAUZE/BANDAGES/DRESSINGS) IMPLANT
BANDAGE COBAN STERILE 2 (GAUZE/BANDAGES/DRESSINGS) IMPLANT
BENZOIN TINCTURE PRP APPL 2/3 (GAUZE/BANDAGES/DRESSINGS) IMPLANT
BLADE MINI RND TIP GREEN BEAV (BLADE) IMPLANT
BLADE SURG 15 STRL LF DISP TIS (BLADE) ×2 IMPLANT
BLADE SURG 15 STRL SS (BLADE) ×4
BNDG COHESIVE 1X5 TAN STRL LF (GAUZE/BANDAGES/DRESSINGS) ×3 IMPLANT
BNDG CONFORM 2 STRL LF (GAUZE/BANDAGES/DRESSINGS) IMPLANT
BNDG ELASTIC 2X5.8 VLCR STR LF (GAUZE/BANDAGES/DRESSINGS) IMPLANT
BNDG ESMARK 4X9 LF (GAUZE/BANDAGES/DRESSINGS) ×3 IMPLANT
BNDG GAUZE 1X2.1 STRL (MISCELLANEOUS) IMPLANT
BNDG GAUZE ELAST 4 BULKY (GAUZE/BANDAGES/DRESSINGS) IMPLANT
BNDG PLASTER X FAST 3X3 WHT LF (CAST SUPPLIES) IMPLANT
CHLORAPREP W/TINT 26ML (MISCELLANEOUS) ×3 IMPLANT
CLOSURE WOUND 1/2 X4 (GAUZE/BANDAGES/DRESSINGS)
CORD BIPOLAR FORCEPS 12FT (ELECTRODE) ×3 IMPLANT
COVER BACK TABLE 60X90IN (DRAPES) ×3 IMPLANT
COVER MAYO STAND STRL (DRAPES) ×3 IMPLANT
CUFF TOURNIQUET SINGLE 18IN (TOURNIQUET CUFF) ×3 IMPLANT
DRAPE EXTREMITY T 121X128X90 (DRAPE) ×3 IMPLANT
DRAPE SURG 17X23 STRL (DRAPES) ×3 IMPLANT
GAUZE SPONGE 4X4 12PLY STRL (GAUZE/BANDAGES/DRESSINGS) ×3 IMPLANT
GAUZE XEROFORM 1X8 LF (GAUZE/BANDAGES/DRESSINGS) ×3 IMPLANT
GLOVE BIO SURGEON STRL SZ7.5 (GLOVE) ×3 IMPLANT
GLOVE BIOGEL PI IND STRL 7.0 (GLOVE) ×1 IMPLANT
GLOVE BIOGEL PI IND STRL 8 (GLOVE) ×1 IMPLANT
GLOVE BIOGEL PI INDICATOR 7.0 (GLOVE) ×2
GLOVE BIOGEL PI INDICATOR 8 (GLOVE) ×2
GLOVE ECLIPSE 6.5 STRL STRAW (GLOVE) ×3 IMPLANT
GOWN STRL REUS W/ TWL LRG LVL3 (GOWN DISPOSABLE) ×1 IMPLANT
GOWN STRL REUS W/TWL LRG LVL3 (GOWN DISPOSABLE) ×2
GOWN STRL REUS W/TWL XL LVL3 (GOWN DISPOSABLE) ×3 IMPLANT
NEEDLE HYPO 25X1 1.5 SAFETY (NEEDLE) ×3 IMPLANT
NS IRRIG 1000ML POUR BTL (IV SOLUTION) ×3 IMPLANT
PACK BASIN DAY SURGERY FS (CUSTOM PROCEDURE TRAY) ×3 IMPLANT
PAD CAST 3X4 CTTN HI CHSV (CAST SUPPLIES) IMPLANT
PAD CAST 4YDX4 CTTN HI CHSV (CAST SUPPLIES) IMPLANT
PADDING CAST ABS 4INX4YD NS (CAST SUPPLIES)
PADDING CAST ABS COTTON 4X4 ST (CAST SUPPLIES) IMPLANT
PADDING CAST COTTON 3X4 STRL (CAST SUPPLIES)
PADDING CAST COTTON 4X4 STRL (CAST SUPPLIES)
SPLINT FINGER 3.25 911903 (SOFTGOODS) ×3 IMPLANT
STOCKINETTE 4X48 STRL (DRAPES) ×3 IMPLANT
STRIP CLOSURE SKIN 1/2X4 (GAUZE/BANDAGES/DRESSINGS) IMPLANT
SUT ETHILON 3 0 PS 1 (SUTURE) IMPLANT
SUT ETHILON 4 0 PS 2 18 (SUTURE) ×3 IMPLANT
SUT ETHILON 5 0 P 3 18 (SUTURE)
SUT NYLON ETHILON 5-0 P-3 1X18 (SUTURE) IMPLANT
SUT VIC AB 4-0 P2 18 (SUTURE) IMPLANT
SYR BULB 3OZ (MISCELLANEOUS) ×3 IMPLANT
SYR CONTROL 10ML LL (SYRINGE) ×3 IMPLANT
TOWEL OR 17X24 6PK STRL BLUE (TOWEL DISPOSABLE) ×6 IMPLANT
UNDERPAD 30X30 (UNDERPADS AND DIAPERS) ×3 IMPLANT

## 2017-12-26 NOTE — Op Note (Signed)
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  PROCEDURES:  Right index finger excision of mucoid cyst and debridement of distal interphalangeal joint including osteophytes from dorsal aspect of middle phalanx.  SURGEON:  Randall Cover, Randall Edwards.  ASSISTANT:  None.  ANESTHESIA:  Bier block with sedation.  IV FLUIDS:  Per anesthesia flow sheet.  ESTIMATED BLOOD LOSS:  Minimal.  COMPLICATIONS:  None.  SPECIMENS:  Cyst to Pathology.  TOURNIQUET TIME:  23 minutes.  DISPOSITION:  Stable to PACU.  INDICATIONS:  Randall Edwards is a 72 year old male who has noted a mass on the right index finger.  This is bothersome to him.  He wished to have it removed.  Risks, benefits, and alternatives of surgery were discussed including the risks of blood loss, infection, damage to nerves, vessels, tendons, ligaments, and bone, failure of surgery, need for additional surgery, complications with wound healing, continued pain, and recurrence of cyst.  He voiced understanding these risks and elected to proceed.  OPERATIVE COURSE:  After being identified preoperatively by myself, the patient and I agreed upon the procedure and site of procedure.  Surgical site was marked.  The risks, benefits, and alternatives of surgery were reviewed and he wished to proceed.  Surgical consent had been signed. He was given IV Ancef as preoperative antibiotic prophylaxis.  He was transferred to the operating room and placed on the operating room table in supine  position with the right upper extremity on an arm board.  Bier block anesthesia was induced by anesthesiologist.  Right upper extremity was prepped and draped in normal sterile orthopedic fashion.  Surgical pause was performed between surgeons, Anesthesia, and operating room staff; and all were in agreement as to the patient, procedure, and site of procedure.  Tourniquet at the proximal aspect of the forearm had been inflated for the Bier block.  Incision was made in a hockey-stick shape at the index finger DIP joint.  This was carried into subcutaneous tissues by spreading technique.  The cyst was identified.  It was carefully freed up of other soft tissue attachments.  It was removed with the synovectomy rongeurs.  It was sent to Pathology for examination.  The DIP joint was entered underneath the ulnar side of the extensor tendon.  There was prominent osteophyte at the dorsal aspect of the middle phalanx.  This was taken down with the rongeurs and excised. The wound and joint were copiously irrigated with sterile saline.  The wound was then closed with 4-0 nylon in a horizontal mattress fashion. A digital block was performed with 0.25% plain Marcaine to aid in postoperative analgesia.  The wound was dressed with sterile  Xeroform, 4 x 4 and wrapped with a Coban dressing lightly.  An Alumafoam splint was placed and wrapped lightly with Coban dressing.  Tourniquet was deflated at 23 minutes.  Fingertips were pink with brisk capillary refill after deflation of tourniquet.  The operative drapes were broken down and the patient was awoken from anesthesia safely.  He was transferred back to stretcher and taken to PACU in stable condition.  I will see him back in the office in 1 week for postoperative followup.  I will give him Norco 5/325 one to two p.o. q.6 hours p.r.n. pain, dispensed #20.     Randall Cover, Randall Edwards     KK/MEDQ  D:  12/26/2017  T:  12/26/2017  Job:  [id number]

## 2017-12-26 NOTE — Discharge Instructions (Signed)

## 2017-12-26 NOTE — Brief Op Note (Signed)
12/26/2017  2:46 PM  PATIENT:  Randall Edwards  72 y.o. male  PRE-OPERATIVE DIAGNOSIS:  RIGHT INDEX MUCOID CYST AND DISTAL INTERPHALANGEAL JOINT DEBRIDEMENT  POST-OPERATIVE DIAGNOSIS:  RIGHT INDEX MUCOID CYST AND DISTAL INTERPHALANGEAL JOINT DEBRIDEMENT  PROCEDURE:  Procedure(s) with comments: RIGHT INDEX EXCISION CYST AND DEBRIDEMENT OF DISTAL INTERPHALANGEAL JOINT (Right) - Bier block  SURGEON:  Surgeon(s) and Role:    * Leanora Cover, MD - Primary  PHYSICIAN ASSISTANT:   ASSISTANTS: none   ANESTHESIA:   Bier block with sedation  EBL:  Minimal   BLOOD ADMINISTERED:none  DRAINS: none   LOCAL MEDICATIONS USED:  MARCAINE     SPECIMEN:  Source of Specimen:  right index finger  DISPOSITION OF SPECIMEN:  PATHOLOGY  COUNTS:  YES  TOURNIQUET:   Total Tourniquet Time Documented: Forearm (Right) - 23 minutes Total: Forearm (Right) - 23 minutes   DICTATION: .Other Dictation: Dictation Number [id number]  PLAN OF CARE: Discharge to home after PACU  PATIENT DISPOSITION:  PACU - hemodynamically stable.

## 2017-12-26 NOTE — Op Note (Signed)
[id number] 

## 2017-12-26 NOTE — Anesthesia Procedure Notes (Signed)
Anesthesia Regional Block: Bier block (IV Regional)   Pre-Anesthetic Checklist: ,, timeout performed, Correct Patient, Correct Site, Correct Laterality, Correct Procedure,, site marked, surgical consent,, at surgeon's request Needles:  Injection technique: Single-shot  Needle Type: Other      Needle Gauge: 22     Additional Needles:   Procedures:,,,,, intact distal pulses, Esmarch exsanguination, single tourniquet utilized,  Narrative:  Start time: 12/26/2017 2:20 PM End time: 12/26/2017 2:20 PM  Performed by: Personally

## 2017-12-26 NOTE — Transfer of Care (Signed)
Immediate Anesthesia Transfer of Care Note  Patient: Randall Edwards  Procedure(s) Performed: RIGHT INDEX EXCISION CYST AND DEBRIDEMENT OF DISTAL INTERPHALANGEAL JOINT (Right Finger)  Patient Location: PACU  Anesthesia Type:MAC and Bier block  Level of Consciousness: awake, alert  and oriented  Airway & Oxygen Therapy: Patient Spontanous Breathing and Patient connected to face mask oxygen  Post-op Assessment: Report given to RN and Post -op Vital signs reviewed and stable  Post vital signs: Reviewed and stable  Last Vitals:  Vitals Value Taken Time  BP    Temp    Pulse 56 12/26/2017  2:51 PM  Resp 13 12/26/2017  2:51 PM  SpO2 99 % 12/26/2017  2:51 PM  Vitals shown include unvalidated device data.  Last Pain:  Vitals:   12/26/17 1311  TempSrc: Oral         Complications: No apparent anesthesia complications

## 2017-12-26 NOTE — H&P (Signed)
  Randall Edwards is an 72 y.o. male.   Chief Complaint: right index finger cyst HPI: 72 yo male with right index finger mass.  It is bothersome to him and he wishes to have it removed.  Allergies: No Known Allergies  Past Medical History:  Diagnosis Date  . Dysrhythmia    atrial tach, a-fib  . GERD (gastroesophageal reflux disease)   . H/O cardiac radiofrequency ablation 08/2016  . Hyperlipidemia   . Hypertension   . S/P AVR (aortic valve replacement) 2010    Past Surgical History:  Procedure Laterality Date  . ABLATION OF DYSRHYTHMIC FOCUS    . CARDIAC VALVE REPLACEMENT    . CHOLECYSTECTOMY    . KIDNEY STONE SURGERY    . KNEE ARTHROSCOPY Bilateral     Family History: History reviewed. No pertinent family history.  Social History:   reports that he has never smoked. He has never used smokeless tobacco. He reports that he does not drink alcohol or use drugs.  Medications: Medications Prior to Admission  Medication Sig Dispense Refill  . acetaminophen (TYLENOL) 650 MG CR tablet Take 650 mg by mouth.    Marland Kitchen aspirin EC 81 MG tablet Take 81 mg by mouth.    Marland Kitchen omeprazole (PRILOSEC) 20 MG capsule Take 20 mg by mouth.    . rosuvastatin (CRESTOR) 10 MG tablet Take 1 tablet (10 mg total) by mouth every evening. 90 tablet 3  . sotalol (BETAPACE) 80 MG tablet Take 1 tablet (80 mg total) by mouth daily. 90 tablet 1  . tamsulosin (FLOMAX) 0.4 MG CAPS capsule Take 0.4 mg by mouth.    . temazepam (RESTORIL) 15 MG capsule TAKE ONE CAPSULE BY MOUTH AT BEDTIME AS NEEDED    . triamterene-hydrochlorothiazide (MAXZIDE-25) 37.5-25 MG tablet Take by mouth.       No results found for this or any previous visit (from the past 48 hour(s)).  No results found.   A comprehensive review of systems was negative.  Height 5' 8.5" (1.74 m), weight 89.4 kg (197 lb).  General appearance: alert, cooperative and appears stated age Head: Normocephalic, without obvious abnormality, atraumatic Neck:  supple, symmetrical, trachea midline Cardio: regular rate and rhythm Resp: clear to auscultation bilaterally Extremities: Intact sensation and capillary refill all digits.  +epl/fpl/io.  No wounds.  Pulses: 2+ and symmetric Skin: Skin color, texture, turgor normal. No rashes or lesions Neurologic: Grossly normal Incision/Wound: none  Assessment/Plan Right index finger mucoid cyst and dip joint arthritis.  Non operative and operative treatment options were discussed with the patient and patient wishes to proceed with operative treatment. Risks, benefits, and alternatives of surgery were discussed and the patient agrees with the plan of care.   Randall Edwards R 12/26/2017, 1:04 PM

## 2017-12-26 NOTE — Anesthesia Preprocedure Evaluation (Signed)
Anesthesia Evaluation  Patient identified by MRN, date of birth, ID band Patient awake    Reviewed: Allergy & Precautions, NPO status , Patient's Chart, lab work & pertinent test results  Airway Mallampati: II  TM Distance: >3 FB Neck ROM: Full    Dental no notable dental hx.    Pulmonary neg pulmonary ROS,    Pulmonary exam normal breath sounds clear to auscultation       Cardiovascular hypertension, negative cardio ROS Normal cardiovascular exam Rhythm:Regular Rate:Normal     Neuro/Psych negative neurological ROS  negative psych ROS   GI/Hepatic negative GI ROS, Neg liver ROS, GERD  ,  Endo/Other  negative endocrine ROS  Renal/GU negative Renal ROS  negative genitourinary   Musculoskeletal negative musculoskeletal ROS (+)   Abdominal   Peds negative pediatric ROS (+)  Hematology negative hematology ROS (+)   Anesthesia Other Findings   Reproductive/Obstetrics negative OB ROS                             Anesthesia Physical Anesthesia Plan  ASA: II  Anesthesia Plan: Bier Block and Bier Block-LIDOCAINE ONLY   Post-op Pain Management:    Induction: Intravenous  PONV Risk Score and Plan: 1 and Ondansetron  Airway Management Planned: Simple Face Mask  Additional Equipment:   Intra-op Plan:   Post-operative Plan:   Informed Consent: I have reviewed the patients History and Physical, chart, labs and discussed the procedure including the risks, benefits and alternatives for the proposed anesthesia with the patient or authorized representative who has indicated his/her understanding and acceptance.   Dental advisory given  Plan Discussed with: CRNA  Anesthesia Plan Comments:         Anesthesia Quick Evaluation

## 2017-12-26 NOTE — Anesthesia Postprocedure Evaluation (Signed)
Anesthesia Post Note  Patient: Randall Edwards  Procedure(s) Performed: RIGHT INDEX EXCISION CYST AND DEBRIDEMENT OF DISTAL INTERPHALANGEAL JOINT (Right Finger)     Patient location during evaluation: PACU Anesthesia Type: Bier Block Level of consciousness: awake and alert Pain management: pain level controlled Vital Signs Assessment: post-procedure vital signs reviewed and stable Respiratory status: spontaneous breathing, nonlabored ventilation and respiratory function stable Cardiovascular status: blood pressure returned to baseline and stable Postop Assessment: no apparent nausea or vomiting Anesthetic complications: no    Last Vitals:  Vitals:   12/26/17 1509 12/26/17 1515  BP:  (!) 144/82  Pulse: (!) 50 (!) 58  Resp: 19 16  Temp:  36.6 C  SpO2: 98% 97%    Last Pain:  Vitals:   12/26/17 1515  TempSrc:   PainSc: 0-No pain                 Lynda Rainwater

## 2017-12-27 ENCOUNTER — Encounter (HOSPITAL_BASED_OUTPATIENT_CLINIC_OR_DEPARTMENT_OTHER): Payer: Self-pay | Admitting: Orthopedic Surgery

## 2018-01-09 DIAGNOSIS — M19041 Primary osteoarthritis, right hand: Secondary | ICD-10-CM | POA: Insufficient documentation

## 2018-01-17 DIAGNOSIS — F5104 Psychophysiologic insomnia: Secondary | ICD-10-CM | POA: Diagnosis not present

## 2018-01-17 DIAGNOSIS — R972 Elevated prostate specific antigen [PSA]: Secondary | ICD-10-CM | POA: Diagnosis not present

## 2018-01-17 DIAGNOSIS — I10 Essential (primary) hypertension: Secondary | ICD-10-CM | POA: Diagnosis not present

## 2018-01-17 DIAGNOSIS — Z79899 Other long term (current) drug therapy: Secondary | ICD-10-CM | POA: Diagnosis not present

## 2018-01-17 DIAGNOSIS — Z6828 Body mass index (BMI) 28.0-28.9, adult: Secondary | ICD-10-CM | POA: Diagnosis not present

## 2018-01-17 DIAGNOSIS — I480 Paroxysmal atrial fibrillation: Secondary | ICD-10-CM | POA: Diagnosis not present

## 2018-01-17 DIAGNOSIS — E785 Hyperlipidemia, unspecified: Secondary | ICD-10-CM | POA: Diagnosis not present

## 2018-03-02 ENCOUNTER — Other Ambulatory Visit: Payer: Self-pay | Admitting: Cardiology

## 2018-03-13 ENCOUNTER — Other Ambulatory Visit: Payer: Self-pay

## 2018-03-13 DIAGNOSIS — K648 Other hemorrhoids: Secondary | ICD-10-CM | POA: Diagnosis not present

## 2018-03-13 DIAGNOSIS — K5900 Constipation, unspecified: Secondary | ICD-10-CM | POA: Diagnosis not present

## 2018-03-13 DIAGNOSIS — K644 Residual hemorrhoidal skin tags: Secondary | ICD-10-CM | POA: Diagnosis not present

## 2018-03-13 DIAGNOSIS — J208 Acute bronchitis due to other specified organisms: Secondary | ICD-10-CM | POA: Diagnosis not present

## 2018-03-13 DIAGNOSIS — Z6828 Body mass index (BMI) 28.0-28.9, adult: Secondary | ICD-10-CM | POA: Diagnosis not present

## 2018-03-13 MED ORDER — SOTALOL HCL 80 MG PO TABS: 80.0000 mg | ORAL_TABLET | Freq: Every day | ORAL | 1 refills | Status: DC

## 2018-03-21 DIAGNOSIS — N3020 Other chronic cystitis without hematuria: Secondary | ICD-10-CM | POA: Diagnosis not present

## 2018-03-21 DIAGNOSIS — R972 Elevated prostate specific antigen [PSA]: Secondary | ICD-10-CM | POA: Diagnosis not present

## 2018-03-21 DIAGNOSIS — N401 Enlarged prostate with lower urinary tract symptoms: Secondary | ICD-10-CM | POA: Diagnosis not present

## 2018-03-21 DIAGNOSIS — N538 Other male sexual dysfunction: Secondary | ICD-10-CM | POA: Diagnosis not present

## 2018-03-28 DIAGNOSIS — Z125 Encounter for screening for malignant neoplasm of prostate: Secondary | ICD-10-CM | POA: Diagnosis not present

## 2018-03-28 DIAGNOSIS — Z1331 Encounter for screening for depression: Secondary | ICD-10-CM | POA: Diagnosis not present

## 2018-03-28 DIAGNOSIS — Z1339 Encounter for screening examination for other mental health and behavioral disorders: Secondary | ICD-10-CM | POA: Diagnosis not present

## 2018-03-28 DIAGNOSIS — E785 Hyperlipidemia, unspecified: Secondary | ICD-10-CM | POA: Diagnosis not present

## 2018-03-28 DIAGNOSIS — Z136 Encounter for screening for cardiovascular disorders: Secondary | ICD-10-CM | POA: Diagnosis not present

## 2018-03-28 DIAGNOSIS — Z0000 Encounter for general adult medical examination without abnormal findings: Secondary | ICD-10-CM | POA: Diagnosis not present

## 2018-03-28 DIAGNOSIS — Z9181 History of falling: Secondary | ICD-10-CM | POA: Diagnosis not present

## 2018-05-05 DIAGNOSIS — B079 Viral wart, unspecified: Secondary | ICD-10-CM | POA: Diagnosis not present

## 2018-05-05 DIAGNOSIS — L570 Actinic keratosis: Secondary | ICD-10-CM | POA: Diagnosis not present

## 2018-05-05 DIAGNOSIS — L821 Other seborrheic keratosis: Secondary | ICD-10-CM | POA: Diagnosis not present

## 2018-05-05 DIAGNOSIS — L578 Other skin changes due to chronic exposure to nonionizing radiation: Secondary | ICD-10-CM | POA: Diagnosis not present

## 2018-05-05 DIAGNOSIS — R233 Spontaneous ecchymoses: Secondary | ICD-10-CM | POA: Diagnosis not present

## 2018-06-02 DIAGNOSIS — J019 Acute sinusitis, unspecified: Secondary | ICD-10-CM | POA: Diagnosis not present

## 2018-06-05 ENCOUNTER — Encounter: Payer: Self-pay | Admitting: Cardiology

## 2018-06-05 ENCOUNTER — Ambulatory Visit: Payer: PPO | Admitting: Cardiology

## 2018-06-05 VITALS — BP 142/76 | HR 53 | Ht 68.5 in | Wt 193.0 lb

## 2018-06-05 DIAGNOSIS — E785 Hyperlipidemia, unspecified: Secondary | ICD-10-CM

## 2018-06-05 DIAGNOSIS — Z8679 Personal history of other diseases of the circulatory system: Secondary | ICD-10-CM | POA: Diagnosis not present

## 2018-06-05 DIAGNOSIS — Z952 Presence of prosthetic heart valve: Secondary | ICD-10-CM | POA: Diagnosis not present

## 2018-06-05 DIAGNOSIS — I10 Essential (primary) hypertension: Secondary | ICD-10-CM

## 2018-06-05 DIAGNOSIS — I480 Paroxysmal atrial fibrillation: Secondary | ICD-10-CM

## 2018-06-05 DIAGNOSIS — Z9889 Other specified postprocedural states: Secondary | ICD-10-CM

## 2018-06-05 NOTE — Addendum Note (Signed)
Addended by: Tarri Glenn on: 06/05/2018 11:31 AM   Modules accepted: Orders

## 2018-06-05 NOTE — Patient Instructions (Signed)
Medication Instructions:  Your physician recommends that you continue on your current medications as directed. Please refer to the Current Medication list given to you today.  Labwork: None  Testing/Procedures: Your physician has requested that you have an echocardiogram. Echocardiography is a painless test that uses sound waves to create images of your heart. It provides your doctor with information about the size and shape of your heart and how well your heart's chambers and valves are working. This procedure takes approximately one hour. There are no restrictions for this procedure.  Follow-Up: Your physician recommends that you schedule a follow-up appointment in: 6 months  Any Other Special Instructions Will Be Listed Below (If Applicable).     If you need a refill on your cardiac medications before your next appointment, please call your pharmacy.   [facility]  Ashley A, RN, BSN  

## 2018-06-05 NOTE — Progress Notes (Signed)
Cardiology Office Note:    Date:  06/05/2018   ID:  Randall Edwards, DOB 06-Aug-1946, MRN [id number]  PCP:  Nicoletta Dress, MD  Cardiologist:  Jenean Lindau, MD   Referring MD: Nicoletta Dress, MD    ASSESSMENT:    1. Essential hypertension   2. Paroxysmal atrial fibrillation (HCC)   3. Dyslipidemia   4. S/P AVR (aortic valve replacement)   5. Status post radiofrequency ablation for arrhythmia    PLAN:    In order of problems listed above:  1. I discussed my findings with the patient at extensive length.  Primary prevention stressed.  Importance of compliance with diet and medication stressed and he vocalized understanding.  He has not had any palpitations or any such issues. 2. Patient will be seen in follow-up appointment in 6 months or earlier if the patient has any concerns 3. He will have an echocardiogram in the next few days to assess his prosthetic aortic valve.   Medication Adjustments/Labs and Tests Ordered: Current medicines are reviewed at length with the patient today.  Concerns regarding medicines are outlined above.  No orders of the defined types were placed in this encounter.  No orders of the defined types were placed in this encounter.    No chief complaint on file.    History of Present Illness:    Randall Edwards is a 72 y.o. male.  The patient has history of aortic valve replacement.  He denies any problems at this time and takes care of activities of daily living.  No chest pain orthopnea or PND.  He walks on a regular basis and he is asymptomatic.  Past Medical History:  Diagnosis Date  . Dysrhythmia    atrial tach, a-fib  . GERD (gastroesophageal reflux disease)   . H/O cardiac radiofrequency ablation 08/2016  . Hyperlipidemia   . Hypertension   . S/P AVR (aortic valve replacement) 2010    Past Surgical History:  Procedure Laterality Date  . ABLATION OF DYSRHYTHMIC FOCUS    . CARDIAC VALVE REPLACEMENT    . CHOLECYSTECTOMY    .  KIDNEY STONE SURGERY    . KNEE ARTHROSCOPY Bilateral   . MASS EXCISION Right 12/26/2017   Procedure: RIGHT INDEX EXCISION CYST AND DEBRIDEMENT OF DISTAL INTERPHALANGEAL JOINT;  Surgeon: Leanora Cover, MD;  Location: [facility];  Service: Orthopedics;  Laterality: Right;  Bier block    Current Medications: Current Meds  Medication Sig  . aspirin EC 81 MG tablet Take 81 mg by mouth.  . cefUROXime (CEFTIN) 250 MG tablet Take 250 mg by mouth 2 (two) times daily with a meal.  . LINZESS 145 MCG CAPS capsule Take 145 mcg by mouth daily.  . meloxicam (MOBIC) 15 MG tablet Take by mouth.  Marland Kitchen omeprazole (PRILOSEC) 20 MG capsule Take 20 mg by mouth.  . predniSONE (DELTASONE) 10 MG tablet TAKE 4 TABLETS BY MOUTH FOR DAYS, TAKE 3 TABLETS BY MOUTH FOR 2 DAYS, TAKE 2 TABLETS BY MOUTH FOR 2 DAYS, TAKE ONE TABLET BY MOUTH FOR 2 DAY  . rosuvastatin (CRESTOR) 10 MG tablet Take 1 tablet (10 mg total) by mouth every evening.  . sotalol (BETAPACE) 80 MG tablet Take 1 tablet (80 mg total) by mouth daily.  . tamsulosin (FLOMAX) 0.4 MG CAPS capsule Take 0.4 mg by mouth.  . temazepam (RESTORIL) 15 MG capsule TAKE ONE CAPSULE BY MOUTH AT BEDTIME AS NEEDED     Allergies:   Patient has  no known allergies.   Social History   Socioeconomic History  . Marital status: Married    Spouse name: Not on file  . Number of children: Not on file  . Years of education: Not on file  . Highest education level: Not on file  Occupational History  . Not on file  Social Needs  . Financial resource strain: Not on file  . Food insecurity:    Worry: Not on file    Inability: Not on file  . Transportation needs:    Medical: Not on file    Non-medical: Not on file  Tobacco Use  . Smoking status: Never Smoker  . Smokeless tobacco: Never Used  Substance and Sexual Activity  . Alcohol use: No    Frequency: Never  . Drug use: No  . Sexual activity: Not on file  Lifestyle  . Physical activity:    Days per week:  Not on file    Minutes per session: Not on file  . Stress: Not on file  Relationships  . Social connections:    Talks on phone: Not on file    Gets together: Not on file    Attends religious service: Not on file    Active member of club or organization: Not on file    Attends meetings of clubs or organizations: Not on file    Relationship status: Not on file  Other Topics Concern  . Not on file  Social History Narrative  . Not on file     Family History: The patient's family history is not on file.  ROS:   Please see the history of present illness.    All other systems reviewed and are negative.  EKGs/Labs/Other Studies Reviewed:    The following studies were reviewed today: EKG reveals sinus rhythm and nonspecific ST-T changes.   Recent Labs: 12/26/2017: BUN 20; Creatinine, Ser 0.80; Hemoglobin 16.7; Potassium 3.9; Sodium 140  Recent Lipid Panel No results found for: CHOL, TRIG, HDL, CHOLHDL, VLDL, LDLCALC, LDLDIRECT  Physical Exam:    VS:  BP (!) 142/76 (BP Location: Right Arm, Patient Position: Sitting, Cuff Size: Normal)   Pulse (!) 53   Ht 5' 8.5" (1.74 m)   Wt 193 lb (87.5 kg)   SpO2 98%   BMI 28.92 kg/m     Wt Readings from Last 3 Encounters:  06/05/18 193 lb (87.5 kg)  12/26/17 188 lb (85.3 kg)  08/26/17 190 lb (86.2 kg)     GEN: Patient is in no acute distress HEENT: Normal NECK: No JVD; No carotid bruits LYMPHATICS: No lymphadenopathy CARDIAC: Hear sounds regular, 2/6 systolic murmur at the apex. RESPIRATORY:  Clear to auscultation without rales, wheezing or rhonchi  ABDOMEN: Soft, non-tender, non-distended MUSCULOSKELETAL:  No edema; No deformity  SKIN: Warm and dry NEUROLOGIC:  Alert and oriented x 3 PSYCHIATRIC:  Normal affect   Signed, Jenean Lindau, MD  06/05/2018 10:12 AM    [place]

## 2018-06-05 NOTE — Addendum Note (Signed)
Addended by: Mattie Marlin on: 06/05/2018 10:34 AM   Modules accepted: Orders

## 2018-07-17 DIAGNOSIS — M1711 Unilateral primary osteoarthritis, right knee: Secondary | ICD-10-CM | POA: Diagnosis not present

## 2018-07-18 ENCOUNTER — Ambulatory Visit (INDEPENDENT_AMBULATORY_CARE_PROVIDER_SITE_OTHER): Payer: PPO

## 2018-07-18 DIAGNOSIS — Z952 Presence of prosthetic heart valve: Secondary | ICD-10-CM | POA: Diagnosis not present

## 2018-07-18 NOTE — Progress Notes (Signed)
Complete echocardiogram has been performed.  Jimmy Faduma Cho, RDCS, RVT 

## 2018-07-19 ENCOUNTER — Other Ambulatory Visit: Payer: Self-pay

## 2018-07-19 ENCOUNTER — Telehealth: Payer: Self-pay | Admitting: Cardiology

## 2018-07-19 ENCOUNTER — Telehealth: Payer: Self-pay

## 2018-07-19 DIAGNOSIS — R931 Abnormal findings on diagnostic imaging of heart and coronary circulation: Secondary | ICD-10-CM

## 2018-07-19 DIAGNOSIS — I10 Essential (primary) hypertension: Secondary | ICD-10-CM

## 2018-07-19 NOTE — Telephone Encounter (Signed)
Informed patient of results; informed to call the office with any further questions or concerns.  

## 2018-07-19 NOTE — Telephone Encounter (Signed)
Left voicemail for the patient to call the office to discuss echo. 

## 2018-07-19 NOTE — Telephone Encounter (Signed)
Returning your call. °

## 2018-07-20 DIAGNOSIS — F5104 Psychophysiologic insomnia: Secondary | ICD-10-CM | POA: Diagnosis not present

## 2018-07-20 DIAGNOSIS — I10 Essential (primary) hypertension: Secondary | ICD-10-CM | POA: Diagnosis not present

## 2018-07-20 DIAGNOSIS — Z23 Encounter for immunization: Secondary | ICD-10-CM | POA: Diagnosis not present

## 2018-07-20 DIAGNOSIS — I480 Paroxysmal atrial fibrillation: Secondary | ICD-10-CM | POA: Diagnosis not present

## 2018-07-20 DIAGNOSIS — Z79899 Other long term (current) drug therapy: Secondary | ICD-10-CM | POA: Diagnosis not present

## 2018-07-20 DIAGNOSIS — M7552 Bursitis of left shoulder: Secondary | ICD-10-CM | POA: Diagnosis not present

## 2018-07-20 DIAGNOSIS — Z6829 Body mass index (BMI) 29.0-29.9, adult: Secondary | ICD-10-CM | POA: Diagnosis not present

## 2018-07-20 DIAGNOSIS — E785 Hyperlipidemia, unspecified: Secondary | ICD-10-CM | POA: Diagnosis not present

## 2018-07-20 DIAGNOSIS — R972 Elevated prostate specific antigen [PSA]: Secondary | ICD-10-CM | POA: Diagnosis not present

## 2018-07-24 ENCOUNTER — Other Ambulatory Visit: Payer: Self-pay | Admitting: Cardiology

## 2018-07-24 DIAGNOSIS — R931 Abnormal findings on diagnostic imaging of heart and coronary circulation: Secondary | ICD-10-CM | POA: Diagnosis not present

## 2018-07-24 DIAGNOSIS — Z951 Presence of aortocoronary bypass graft: Secondary | ICD-10-CM | POA: Diagnosis not present

## 2018-07-24 DIAGNOSIS — I700 Atherosclerosis of aorta: Secondary | ICD-10-CM | POA: Diagnosis not present

## 2018-07-24 DIAGNOSIS — Z952 Presence of prosthetic heart valve: Secondary | ICD-10-CM | POA: Diagnosis not present

## 2018-07-24 DIAGNOSIS — I712 Thoracic aortic aneurysm, without rupture: Secondary | ICD-10-CM | POA: Diagnosis not present

## 2018-07-25 ENCOUNTER — Telehealth: Payer: Self-pay

## 2018-07-25 ENCOUNTER — Other Ambulatory Visit: Payer: Self-pay

## 2018-07-25 DIAGNOSIS — I10 Essential (primary) hypertension: Secondary | ICD-10-CM

## 2018-07-25 DIAGNOSIS — R931 Abnormal findings on diagnostic imaging of heart and coronary circulation: Secondary | ICD-10-CM

## 2018-07-25 NOTE — Telephone Encounter (Signed)
Informed of lab results via voicemail; instructed to call the office with any questions or concerns.

## 2018-07-26 ENCOUNTER — Other Ambulatory Visit: Payer: Self-pay

## 2018-07-26 ENCOUNTER — Telehealth: Payer: Self-pay

## 2018-07-26 DIAGNOSIS — R931 Abnormal findings on diagnostic imaging of heart and coronary circulation: Secondary | ICD-10-CM

## 2018-07-26 NOTE — Telephone Encounter (Signed)
Informed patient of CTA chest results; informed to call the office with any further questions or concerns.

## 2018-08-14 DIAGNOSIS — K219 Gastro-esophageal reflux disease without esophagitis: Secondary | ICD-10-CM | POA: Diagnosis not present

## 2018-08-14 DIAGNOSIS — J208 Acute bronchitis due to other specified organisms: Secondary | ICD-10-CM | POA: Diagnosis not present

## 2018-08-28 ENCOUNTER — Other Ambulatory Visit: Payer: Self-pay | Admitting: Cardiology

## 2018-09-26 ENCOUNTER — Other Ambulatory Visit: Payer: Self-pay | Admitting: Cardiology

## 2018-11-27 DIAGNOSIS — J019 Acute sinusitis, unspecified: Secondary | ICD-10-CM | POA: Diagnosis not present

## 2018-12-01 ENCOUNTER — Ambulatory Visit: Payer: PPO | Admitting: Cardiology

## 2018-12-05 ENCOUNTER — Ambulatory Visit: Payer: PPO | Admitting: Cardiology

## 2018-12-21 ENCOUNTER — Other Ambulatory Visit: Payer: Self-pay

## 2018-12-21 NOTE — Patient Outreach (Signed)
[facility]) Care Management  12/21/2018  MAVERYK RENSTROM 1945/11/28 [id number]   Telephone Screen  Referral Date:12/21/2018 Referral Source: Nurse Call Center Referral Reason: ' 12/20/2018-9;41pm-caller states has pimple like areas all over back of head,fells like hair follicles clogged, been going on since 1st of March, took amoxicillin for dental problem and went away but came back, painful,stingy pain, a little itchy" Insurance: [employer]   Outreach attempt #1 to patient. Spoke with patient who voices he is doing fine. He denies any acute issues or concerns at this time. He voices that area to his head is not currently hurting or bothering him. He states that it only bothers him when he combs his hair. He did not follow up with MD as advised by nurse at call center. Patient voices tht he has PCP appt in about two week and since the issue is not that severe he feels like he is okay.to wait until then. He is aware to seek medical attention sooner if symptoms worsen. Patient advised to feel free to call 24hr Nurse Line for any future needs or concerns. He voiced understanding and was appreciative of follow up call     Plan: RN CM will close case as no further interventions needed at this time.    Enzo Montgomery, RN,BSN,CCM [place] [facility] Telephonic Care Management Coordinator Direct Phone: [phone] Toll Free: [phone] Fax: [phone]

## 2019-01-19 DIAGNOSIS — M25552 Pain in left hip: Secondary | ICD-10-CM | POA: Diagnosis not present

## 2019-01-19 DIAGNOSIS — I10 Essential (primary) hypertension: Secondary | ICD-10-CM | POA: Diagnosis not present

## 2019-01-19 DIAGNOSIS — I480 Paroxysmal atrial fibrillation: Secondary | ICD-10-CM | POA: Diagnosis not present

## 2019-01-19 DIAGNOSIS — E663 Overweight: Secondary | ICD-10-CM | POA: Diagnosis not present

## 2019-01-19 DIAGNOSIS — Z139 Encounter for screening, unspecified: Secondary | ICD-10-CM | POA: Diagnosis not present

## 2019-01-19 DIAGNOSIS — Z79899 Other long term (current) drug therapy: Secondary | ICD-10-CM | POA: Diagnosis not present

## 2019-01-19 DIAGNOSIS — L729 Follicular cyst of the skin and subcutaneous tissue, unspecified: Secondary | ICD-10-CM | POA: Diagnosis not present

## 2019-01-19 DIAGNOSIS — R972 Elevated prostate specific antigen [PSA]: Secondary | ICD-10-CM | POA: Diagnosis not present

## 2019-01-19 DIAGNOSIS — E785 Hyperlipidemia, unspecified: Secondary | ICD-10-CM | POA: Diagnosis not present

## 2019-01-19 DIAGNOSIS — F5104 Psychophysiologic insomnia: Secondary | ICD-10-CM | POA: Diagnosis not present

## 2019-01-19 DIAGNOSIS — M25551 Pain in right hip: Secondary | ICD-10-CM | POA: Diagnosis not present

## 2019-01-29 ENCOUNTER — Telehealth: Payer: Self-pay | Admitting: Cardiology

## 2019-01-29 NOTE — Telephone Encounter (Signed)
Left message to call office to advise if willing to do Televisit or Video and obtain Consent/LBW

## 2019-02-02 ENCOUNTER — Ambulatory Visit: Payer: PPO | Admitting: Cardiology

## 2019-02-09 DIAGNOSIS — B9689 Other specified bacterial agents as the cause of diseases classified elsewhere: Secondary | ICD-10-CM | POA: Diagnosis not present

## 2019-02-09 DIAGNOSIS — J029 Acute pharyngitis, unspecified: Secondary | ICD-10-CM | POA: Diagnosis not present

## 2019-02-22 ENCOUNTER — Other Ambulatory Visit: Payer: Self-pay | Admitting: Cardiology

## 2019-03-05 DIAGNOSIS — S81812A Laceration without foreign body, left lower leg, initial encounter: Secondary | ICD-10-CM | POA: Diagnosis not present

## 2019-03-08 DIAGNOSIS — Z23 Encounter for immunization: Secondary | ICD-10-CM | POA: Diagnosis not present

## 2019-03-20 DIAGNOSIS — L729 Follicular cyst of the skin and subcutaneous tissue, unspecified: Secondary | ICD-10-CM | POA: Diagnosis not present

## 2019-03-20 DIAGNOSIS — S81802A Unspecified open wound, left lower leg, initial encounter: Secondary | ICD-10-CM | POA: Diagnosis not present

## 2019-03-26 ENCOUNTER — Other Ambulatory Visit: Payer: Self-pay | Admitting: Cardiology

## 2019-03-28 ENCOUNTER — Ambulatory Visit: Payer: PPO | Admitting: Cardiology

## 2019-03-28 ENCOUNTER — Other Ambulatory Visit: Payer: Self-pay

## 2019-03-28 MED ORDER — SOTALOL HCL 80 MG PO TABS: 80.0000 mg | ORAL_TABLET | Freq: Every day | ORAL | 0 refills | Status: DC

## 2019-03-28 NOTE — Telephone Encounter (Signed)
Sotalol refill sent to [facility]. Overdue for f/u appt

## 2019-03-30 DIAGNOSIS — N401 Enlarged prostate with lower urinary tract symptoms: Secondary | ICD-10-CM | POA: Diagnosis not present

## 2019-03-30 DIAGNOSIS — R972 Elevated prostate specific antigen [PSA]: Secondary | ICD-10-CM | POA: Diagnosis not present

## 2019-03-30 DIAGNOSIS — R361 Hematospermia: Secondary | ICD-10-CM | POA: Diagnosis not present

## 2019-03-30 DIAGNOSIS — N3000 Acute cystitis without hematuria: Secondary | ICD-10-CM | POA: Diagnosis not present

## 2019-03-30 DIAGNOSIS — N538 Other male sexual dysfunction: Secondary | ICD-10-CM | POA: Diagnosis not present

## 2019-04-02 ENCOUNTER — Other Ambulatory Visit: Payer: Self-pay

## 2019-04-02 ENCOUNTER — Ambulatory Visit (INDEPENDENT_AMBULATORY_CARE_PROVIDER_SITE_OTHER): Payer: PPO | Admitting: Cardiology

## 2019-04-02 ENCOUNTER — Encounter: Payer: Self-pay | Admitting: Cardiology

## 2019-04-02 VITALS — BP 116/62 | HR 72 | Ht 68.5 in | Wt 189.0 lb

## 2019-04-02 DIAGNOSIS — Z9181 History of falling: Secondary | ICD-10-CM | POA: Diagnosis not present

## 2019-04-02 DIAGNOSIS — Z1331 Encounter for screening for depression: Secondary | ICD-10-CM | POA: Diagnosis not present

## 2019-04-02 DIAGNOSIS — Z952 Presence of prosthetic heart valve: Secondary | ICD-10-CM

## 2019-04-02 DIAGNOSIS — Z125 Encounter for screening for malignant neoplasm of prostate: Secondary | ICD-10-CM | POA: Diagnosis not present

## 2019-04-02 DIAGNOSIS — Z0000 Encounter for general adult medical examination without abnormal findings: Secondary | ICD-10-CM | POA: Diagnosis not present

## 2019-04-02 DIAGNOSIS — E785 Hyperlipidemia, unspecified: Secondary | ICD-10-CM | POA: Diagnosis not present

## 2019-04-02 NOTE — Patient Instructions (Addendum)
Medication Instructions:  Your physician recommends that you continue on your current medications as directed. Please refer to the Current Medication list given to you today.  If you need a refill on your cardiac medications before your next appointment, please call your pharmacy.   Lab work: None  If you have labs (blood work) drawn today and your tests are completely normal, you will receive your results only by: Marland Kitchen MyChart Message (if you have MyChart) OR . A paper copy in the mail If you have any lab test that is abnormal or we need to change your treatment, we will call you to review the results.  Testing/Procedures: Your physician has requested that you have an echocardiogram. Echocardiography is a painless test that uses sound waves to create images of your heart. It provides your doctor with information about the size and shape of your heart and how well your heart's chambers and valves are working. This procedure takes approximately one hour. There are no restrictions for this procedure.     Follow-Up: At [facility], you and your health needs are our priority.  As part of our continuing mission to provide you with exceptional heart care, we have created designated Provider Care Teams.  These Care Teams include your primary Cardiologist (physician) and Advanced Practice Providers (APPs -  Physician Assistants and Nurse Practitioners) who all work together to provide you with the care you need, when you need it. You will need a follow up appointment in 6 months.  Please call our office 2 months in advance to schedule this appointment.  You may see No primary care provider on file. or another member of our [employer] Provider Team in [place]: Jenne Campus, MD . Shirlee More, MD

## 2019-04-02 NOTE — Addendum Note (Signed)
Addended by: Tarri Glenn on: 04/02/2019 02:07 PM   Modules accepted: Orders

## 2019-04-02 NOTE — Progress Notes (Signed)
Cardiology Office Note:    Date:  04/02/2019   ID:  [name], DOB 21-Dec-1945, MRN [id number]  PCP:  Nicoletta Dress, MD  Cardiologist:  Jenean Lindau, MD   Referring MD: Nicoletta Dress, MD    ASSESSMENT:    1. S/P AVR (aortic valve replacement)   2. Dyslipidemia    PLAN:    In order of problems listed above:  1. Status post aortic valve replacement: Appears to be stable from a clinical standpoint.  He will have an echocardiogram to assess this. 2. Mixed dyslipidemia: Diet was discussed.  Lipids followed by primary care physician.  Importance of compliance with exercise stressed and he vocalized understanding. 3. Patient will be seen in follow-up appointment in 6 months or earlier if the patient has any concerns    Medication Adjustments/Labs and Tests Ordered: Current medicines are reviewed at length with the patient today.  Concerns regarding medicines are outlined above.  No orders of the defined types were placed in this encounter.  No orders of the defined types were placed in this encounter.    No chief complaint on file.    History of Present Illness:    Randall Edwards is a 73 y.o. male.  Patient has past medical history of aortic valve replacement and dyslipidemia.  He has history of atrial tachycardia and it was evaluated by electrophysiology in the past.  He denies any problems at this time and takes care of activities of daily living.  No chest pain orthopnea or PND.  He is an active gentleman.  At the time of my evaluation, the patient is alert awake oriented and in no distress.  Past Medical History:  Diagnosis Date  . Dysrhythmia    atrial tach, a-fib  . GERD (gastroesophageal reflux disease)   . H/O cardiac radiofrequency ablation 08/2016  . Hyperlipidemia   . Hypertension   . S/P AVR (aortic valve replacement) 2010    Past Surgical History:  Procedure Laterality Date  . ABLATION OF DYSRHYTHMIC FOCUS    . CARDIAC VALVE REPLACEMENT     . CHOLECYSTECTOMY    . KIDNEY STONE SURGERY    . KNEE ARTHROSCOPY Bilateral   . MASS EXCISION Right 12/26/2017   Procedure: RIGHT INDEX EXCISION CYST AND DEBRIDEMENT OF DISTAL INTERPHALANGEAL JOINT;  Surgeon: Leanora Cover, MD;  Location: [facility];  Service: Orthopedics;  Laterality: Right;  Bier block    Current Medications: Current Meds  Medication Sig  . aspirin EC 81 MG tablet Take 81 mg by mouth.  . doxycycline (VIBRAMYCIN) 100 MG capsule Take 1 capsule by mouth 2 (two) times a day.  Marland Kitchen omeprazole (PRILOSEC) 20 MG capsule Take 20 mg by mouth.  . rosuvastatin (CRESTOR) 10 MG tablet TAKE ONE TABLET BY MOUTH EVERY DAY IN THE EVENING  . sotalol (BETAPACE) 80 MG tablet Take 1 tablet (80 mg total) by mouth daily. "FOLLOW UP VISIT REQUIRED FOR FURTHER REFILLS*  . tamsulosin (FLOMAX) 0.4 MG CAPS capsule Take 0.4 mg by mouth.  . temazepam (RESTORIL) 15 MG capsule TAKE ONE CAPSULE BY MOUTH AT BEDTIME AS NEEDED  . triamterene-hydrochlorothiazide (MAXZIDE-25) 37.5-25 MG tablet TAKE ONE TABLET BY MOUTH EVERY DAY     Allergies:   Patient has no known allergies.   Social History   Socioeconomic History  . Marital status: Married    Spouse name: Not on file  . Number of children: Not on file  . Years of education: Not on file  .  Highest education level: Not on file  Occupational History  . Not on file  Social Needs  . Financial resource strain: Not on file  . Food insecurity    Worry: Not on file    Inability: Not on file  . Transportation needs    Medical: Not on file    Non-medical: Not on file  Tobacco Use  . Smoking status: Never Smoker  . Smokeless tobacco: Never Used  Substance and Sexual Activity  . Alcohol use: No    Frequency: Never  . Drug use: No  . Sexual activity: Not on file  Lifestyle  . Physical activity    Days per week: Not on file    Minutes per session: Not on file  . Stress: Not on file  Relationships  . Social Herbalist on  phone: Not on file    Gets together: Not on file    Attends religious service: Not on file    Active member of club or organization: Not on file    Attends meetings of clubs or organizations: Not on file    Relationship status: Not on file  Other Topics Concern  . Not on file  Social History Narrative  . Not on file     Family History: The patient's family history is not on file.  ROS:   Please see the history of present illness.    All other systems reviewed and are negative.  EKGs/Labs/Other Studies Reviewed:    The following studies were reviewed today: EKG reveals sinus rhythm and nonspecific ST-T changes.   Recent Labs: No results found for requested labs within last 8760 hours.  Recent Lipid Panel No results found for: CHOL, TRIG, HDL, CHOLHDL, VLDL, LDLCALC, LDLDIRECT  Physical Exam:    VS:  BP 116/62 (BP Location: Left Arm, Patient Position: Sitting, Cuff Size: Normal)   Pulse 72   Ht 5' 8.5" (1.74 m)   Wt 189 lb (85.7 kg)   SpO2 98%   BMI 28.32 kg/m     Wt Readings from Last 3 Encounters:  04/02/19 189 lb (85.7 kg)  06/05/18 193 lb (87.5 kg)  12/26/17 188 lb (85.3 kg)     GEN: Patient is in no acute distress HEENT: Normal NECK: No JVD; No carotid bruits LYMPHATICS: No lymphadenopathy CARDIAC: Hear sounds regular, 2/6 systolic murmur at the apex. RESPIRATORY:  Clear to auscultation without rales, wheezing or rhonchi  ABDOMEN: Soft, non-tender, non-distended MUSCULOSKELETAL:  No edema; No deformity  SKIN: Warm and dry NEUROLOGIC:  Alert and oriented x 3 PSYCHIATRIC:  Normal affect   Signed, Jenean Lindau, MD  04/02/2019 1:52 PM    [facility]

## 2019-04-11 DIAGNOSIS — H2513 Age-related nuclear cataract, bilateral: Secondary | ICD-10-CM | POA: Diagnosis not present

## 2019-04-11 DIAGNOSIS — H31091 Other chorioretinal scars, right eye: Secondary | ICD-10-CM | POA: Diagnosis not present

## 2019-04-11 DIAGNOSIS — H5203 Hypermetropia, bilateral: Secondary | ICD-10-CM | POA: Diagnosis not present

## 2019-04-23 ENCOUNTER — Other Ambulatory Visit: Payer: Self-pay | Admitting: Cardiology

## 2019-04-30 DIAGNOSIS — L821 Other seborrheic keratosis: Secondary | ICD-10-CM | POA: Diagnosis not present

## 2019-04-30 DIAGNOSIS — D1801 Hemangioma of skin and subcutaneous tissue: Secondary | ICD-10-CM | POA: Diagnosis not present

## 2019-04-30 DIAGNOSIS — L820 Inflamed seborrheic keratosis: Secondary | ICD-10-CM | POA: Diagnosis not present

## 2019-04-30 DIAGNOSIS — L304 Erythema intertrigo: Secondary | ICD-10-CM | POA: Diagnosis not present

## 2019-05-14 ENCOUNTER — Other Ambulatory Visit: Payer: Self-pay

## 2019-05-14 ENCOUNTER — Ambulatory Visit (INDEPENDENT_AMBULATORY_CARE_PROVIDER_SITE_OTHER): Payer: PPO

## 2019-05-14 DIAGNOSIS — Z952 Presence of prosthetic heart valve: Secondary | ICD-10-CM

## 2019-05-14 NOTE — Progress Notes (Signed)
Complete echocardiogram has been performed.  Jimmy Halford Goetzke RDCS, RVT 

## 2019-05-16 ENCOUNTER — Encounter: Payer: Self-pay | Admitting: Cardiology

## 2019-05-16 ENCOUNTER — Other Ambulatory Visit: Payer: Self-pay

## 2019-05-16 ENCOUNTER — Ambulatory Visit (INDEPENDENT_AMBULATORY_CARE_PROVIDER_SITE_OTHER): Payer: PPO | Admitting: Cardiology

## 2019-05-16 VITALS — BP 110/90 | HR 83 | Ht 68.5 in | Wt 190.0 lb

## 2019-05-16 DIAGNOSIS — I10 Essential (primary) hypertension: Secondary | ICD-10-CM | POA: Diagnosis not present

## 2019-05-16 DIAGNOSIS — I480 Paroxysmal atrial fibrillation: Secondary | ICD-10-CM

## 2019-05-16 DIAGNOSIS — I350 Nonrheumatic aortic (valve) stenosis: Secondary | ICD-10-CM | POA: Insufficient documentation

## 2019-05-16 DIAGNOSIS — I77810 Thoracic aortic ectasia: Secondary | ICD-10-CM

## 2019-05-16 DIAGNOSIS — Z952 Presence of prosthetic heart valve: Secondary | ICD-10-CM

## 2019-05-16 HISTORY — DX: Thoracic aortic ectasia: I77.810

## 2019-05-16 HISTORY — DX: Nonrheumatic aortic (valve) stenosis: I35.0

## 2019-05-16 NOTE — Progress Notes (Signed)
Cardiology Office Note:    Date:  05/16/2019   ID:  Randall Edwards, DOB 10-26-1945, MRN [id number]  PCP:  Nicoletta Dress, MD  Cardiologist:  Jenean Lindau, MD   Referring MD: Nicoletta Dress, MD    ASSESSMENT:    1. Paroxysmal atrial fibrillation (HCC)   2. Essential hypertension   3. S/P AVR (aortic valve replacement)    PLAN:    In order of problems listed above:  1. Aortic valve stenosis.  He has a prosthetic valve post replacement and aortic root and ascending aorta repair approximately 10 years ago.  I discussed my findings with the patient at extensive length.  Echocardiogram report is mentioned below.  I think she would be best served by referring him to our specialist at the valve clinic to assess the further plan of action.  He might need cardiac catheterization and also the possibility of a CT scan to assess his cardiac anatomy especially the aorta and root.  I discussed this with him and told him that the final decisions will be made by experts in this field and I am going to refer him to the valve clinic doctors 2. Essential hypertension: Blood pressure is stable 3. Patient will be seen in follow-up appointment after the above evaluation and treatment.   Medication Adjustments/Labs and Tests Ordered: Current medicines are reviewed at length with the patient today.  Concerns regarding medicines are outlined above.  No orders of the defined types were placed in this encounter.  No orders of the defined types were placed in this encounter.    Chief Complaint  Patient presents with  . Follow-up    echo     History of Present Illness:    Randall Edwards is a 73 y.o. male.  Patient has history of aortic valve replacement and aortic root repair.  Subsequently has had no significant problem and was doing fine.  No chest pain orthopnea or PND.  At the time of my evaluation, the patient is alert awake oriented and in no distress.  His echocardiogram revealed that  his aortic stenosis is not significant and his ascending aorta is dilated at 46 mm.  He leads a sedentary lifestyle because of arthritis issues.  He denies any chest pain orthopnea PND dizziness or syncope.  He has some shortness of breath on exertion but this is very vague in terms of his history.  Past Medical History:  Diagnosis Date  . Dysrhythmia    atrial tach, a-fib  . GERD (gastroesophageal reflux disease)   . H/O cardiac radiofrequency ablation 08/2016  . Hyperlipidemia   . Hypertension   . S/P AVR (aortic valve replacement) 2010    Past Surgical History:  Procedure Laterality Date  . ABLATION OF DYSRHYTHMIC FOCUS    . CARDIAC VALVE REPLACEMENT    . CHOLECYSTECTOMY    . KIDNEY STONE SURGERY    . KNEE ARTHROSCOPY Bilateral   . MASS EXCISION Right 12/26/2017   Procedure: RIGHT INDEX EXCISION CYST AND DEBRIDEMENT OF DISTAL INTERPHALANGEAL JOINT;  Surgeon: Leanora Cover, MD;  Location: [facility];  Service: Orthopedics;  Laterality: Right;  Bier block    Current Medications: Current Meds  Medication Sig  . aspirin EC 81 MG tablet Take 81 mg by mouth.  Marland Kitchen omeprazole (PRILOSEC) 20 MG capsule Take 20 mg by mouth.  . rosuvastatin (CRESTOR) 10 MG tablet TAKE ONE TABLET BY MOUTH EVERY DAY IN THE EVENING  . sotalol (BETAPACE) 80 MG  tablet Take 1 tablet (80 mg total) by mouth daily. "FOLLOW UP VISIT REQUIRED FOR FURTHER REFILLS*  . tamsulosin (FLOMAX) 0.4 MG CAPS capsule Take 0.4 mg by mouth.  . temazepam (RESTORIL) 15 MG capsule TAKE ONE CAPSULE BY MOUTH AT BEDTIME AS NEEDED  . triamterene-hydrochlorothiazide (MAXZIDE-25) 37.5-25 MG tablet TAKE ONE TABLET BY MOUTH EVERY DAY  . [DISCONTINUED] doxycycline (VIBRAMYCIN) 100 MG capsule Take 1 capsule by mouth 2 (two) times a day.     Allergies:   Patient has no known allergies.   Social History   Socioeconomic History  . Marital status: Married    Spouse name: Not on file  . Number of children: Not on file  . Years  of education: Not on file  . Highest education level: Not on file  Occupational History  . Not on file  Social Needs  . Financial resource strain: Not on file  . Food insecurity    Worry: Not on file    Inability: Not on file  . Transportation needs    Medical: Not on file    Non-medical: Not on file  Tobacco Use  . Smoking status: Never Smoker  . Smokeless tobacco: Never Used  Substance and Sexual Activity  . Alcohol use: No    Frequency: Never  . Drug use: No  . Sexual activity: Not on file  Lifestyle  . Physical activity    Days per week: Not on file    Minutes per session: Not on file  . Stress: Not on file  Relationships  . Social Herbalist on phone: Not on file    Gets together: Not on file    Attends religious service: Not on file    Active member of club or organization: Not on file    Attends meetings of clubs or organizations: Not on file    Relationship status: Not on file  Other Topics Concern  . Not on file  Social History Narrative  . Not on file     Family History: The patient's family history is not on file.  ROS:   Please see the history of present illness.    All other systems reviewed and are negative.  EKGs/Labs/Other Studies Reviewed:    The following studies were reviewed today: IMPRESSIONS    1. The left ventricle has normal systolic function with an ejection fraction of 60-65%. The cavity size was normal. There is moderately increased left ventricular wall thickness. Left ventricular diastolic Doppler parameters are consistent with impaired  relaxation.  2. The right ventricle has normal systolic function. The cavity was normal. There is no increase in right ventricular wall thickness.  3. Left atrial size was mild-moderately dilated.  4. The mitral valve is grossly normal.  5. The tricuspid valve is grossly normal.  6. Aortic valve regurgitation was not assessed by color flow Doppler. Severe stenosis of the aortic valve.   7. The patient has a bioprosthetic valve.  8. Aneurysm of the ascending aorta, measuring 46 mm.  9. The aorta is abnormal unless otherwise noted.    Recent Labs: No results found for requested labs within last 8760 hours.  Recent Lipid Panel No results found for: CHOL, TRIG, HDL, CHOLHDL, VLDL, LDLCALC, LDLDIRECT  Physical Exam:    VS:  BP 110/90 (BP Location: Left Arm, Patient Position: Sitting, Cuff Size: Normal)   Pulse 83   Ht 5' 8.5" (1.74 m)   Wt 190 lb (86.2 kg)   SpO2 97%  BMI 28.47 kg/m     Wt Readings from Last 3 Encounters:  05/16/19 190 lb (86.2 kg)  04/02/19 189 lb (85.7 kg)  06/05/18 193 lb (87.5 kg)     GEN: Patient is in no acute distress HEENT: Normal NECK: No JVD; No carotid bruits LYMPHATICS: No lymphadenopathy CARDIAC: Hear sounds regular, 2/6 systolic murmur at the apex. RESPIRATORY:  Clear to auscultation without rales, wheezing or rhonchi  ABDOMEN: Soft, non-tender, non-distended MUSCULOSKELETAL:  No edema; No deformity  SKIN: Warm and dry NEUROLOGIC:  Alert and oriented x 3 PSYCHIATRIC:  Normal affect   Signed, Jenean Lindau, MD  05/16/2019 3:23 PM    [facility]

## 2019-05-16 NOTE — Patient Instructions (Addendum)
Medication Instructions:  Your physician recommends that you continue on your current medications as directed. Please refer to the Current Medication list given to you today.  If you need a refill on your cardiac medications before your next appointment, please call your pharmacy.   Lab work: NONE If you have labs (blood work) drawn today and your tests are completely normal, you will receive your results only by: Marland Kitchen MyChart Message (if you have MyChart) OR . A paper copy in the mail If you have any lab test that is abnormal or we need to change your treatment, we will call you to review the results.  Testing/Procedures: You have been referred to the valve clinic. You will be contacted to schedule this consultation.   Follow-Up: At [facility] you and your health needs are our priority.  As part of our continuing mission to provide you with exceptional heart care, we have created designated Provider Care Teams.  These Care Teams include your primary Cardiologist (physician) and Advanced Practice Providers (APPs -  Physician Assistants and Nurse Practitioners) who all work together to provide you with the care you need, when you need it. You will need a follow up a when your treatment is complete with the valve clinic.

## 2019-05-17 ENCOUNTER — Telehealth: Payer: Self-pay

## 2019-05-17 NOTE — Telephone Encounter (Signed)
Patient was seen on 05/16/19 by Dr. Docia Furl and echo results reviewed. RN verified and reconciled medications.

## 2019-05-17 NOTE — Telephone Encounter (Signed)
-----   Message from Jenean Lindau, MD sent at 05/14/2019  3:44 PM EDT ----- Needs appointment in the next couple of weeks to discuss these findings Jenean Lindau, MD 05/14/2019 3:44 PM

## 2019-05-24 ENCOUNTER — Other Ambulatory Visit: Payer: Self-pay | Admitting: Cardiology

## 2019-05-25 ENCOUNTER — Institutional Professional Consult (permissible substitution): Payer: PPO | Admitting: Cardiovascular Disease

## 2019-05-25 ENCOUNTER — Other Ambulatory Visit: Payer: Self-pay

## 2019-05-25 MED ORDER — SOTALOL HCL 80 MG PO TABS: 80.0000 mg | ORAL_TABLET | Freq: Every day | ORAL | 0 refills | Status: DC

## 2019-05-28 ENCOUNTER — Telehealth: Payer: Self-pay | Admitting: *Deleted

## 2019-05-28 ENCOUNTER — Encounter: Payer: Self-pay | Admitting: Surgery

## 2019-05-28 ENCOUNTER — Telehealth: Payer: Self-pay | Admitting: Cardiovascular Disease

## 2019-05-28 DIAGNOSIS — R0982 Postnasal drip: Secondary | ICD-10-CM | POA: Diagnosis not present

## 2019-05-28 DIAGNOSIS — J309 Allergic rhinitis, unspecified: Secondary | ICD-10-CM | POA: Diagnosis not present

## 2019-05-28 DIAGNOSIS — Z6828 Body mass index (BMI) 28.0-28.9, adult: Secondary | ICD-10-CM | POA: Diagnosis not present

## 2019-05-28 NOTE — Telephone Encounter (Signed)
The patient has a consult with Dr. Burt Knack 9/17.  Note placed on appointment for check-in staff to allow visitor.

## 2019-05-28 NOTE — Telephone Encounter (Signed)
Information relayed to patient's wife Randall Edwards, no further questions.

## 2019-05-28 NOTE — Telephone Encounter (Signed)
Ok for [employer]

## 2019-05-28 NOTE — Telephone Encounter (Signed)
Pt can't sleep at night due to inflammation in lungs and pt wants to go PCP for a kenolog shot, Is that okay before this study, will it interfere with study, please advise.

## 2019-05-28 NOTE — Telephone Encounter (Signed)
New Message:    Pt said he received a message, that if anybody wanted to come in with him for his office visit to let the office know. Pt says his wife will be coming with him

## 2019-05-29 ENCOUNTER — Telehealth: Payer: Self-pay

## 2019-05-31 ENCOUNTER — Ambulatory Visit: Payer: PPO | Admitting: Cardiovascular Disease

## 2019-05-31 ENCOUNTER — Encounter: Payer: Self-pay | Admitting: Cardiovascular Disease

## 2019-05-31 ENCOUNTER — Other Ambulatory Visit: Payer: Self-pay

## 2019-05-31 VITALS — BP 142/84 | HR 65 | Ht 69.0 in | Wt 189.1 lb

## 2019-05-31 DIAGNOSIS — Z952 Presence of prosthetic heart valve: Secondary | ICD-10-CM | POA: Diagnosis not present

## 2019-05-31 DIAGNOSIS — I350 Nonrheumatic aortic (valve) stenosis: Secondary | ICD-10-CM

## 2019-05-31 NOTE — H&P (View-Only) (Signed)
[facility]   MULTIDISCIPLINARY HEART VALVE TEAM  Date:  05/31/2019   ID:  Randall Edwards, DOB 07/31/1946, MRN [id number]  PCP:  Nicoletta Dress, MD   Chief Complaint  Patient presents with  . Aortic Stenosis     HISTORY OF PRESENT ILLNESS: Randall Edwards is a 73 y.o. male who presents for evaluation of severe bioprosthetic aortic stenosis, referred by Dr Geraldo Pitter.  The patient is here with his wife today. He initially underwent surgical aortic valve replacement in 2010 by Dr Cyndia Bent, at [facility]. I do not have records from his original surgery, but he reports that he had a 'leaky aortic valve.' He also underwent repair of the ascending aorta at the time of his surgery. He reports no problems at the time of surgery and he had an uneventful recovery. He has a card that shows he was treated with a 25 mm Edwards pericardial tissue valve, model [id number].  The patient has had regular dental care and reports no problems with his teeth.  About 6 weeks ago he developed profound fatigue, but thought it was secondary to a sinus problem. He reports a kenalog shot recently and feels much better since he had this. He complains of fatigue with moderate activity, such as yard work. He denies dyspnea or chest pain. No edema. He has no history of stroke or TIA. He reports rare dizziness. He has a history of atrial fibrillation and apparently underwent afib ablation in 2017. He is not anticoagulated and he takes sotalol for rhythm control. He has arthritis in his hips, back, shoulder, and hands.   The patient is a retired Engineer, structural. He has one grown son and 2 grandsons, ages 23 and 17.   Past Medical History:  Diagnosis Date  . Dysrhythmia    atrial tach, a-fib  . GERD (gastroesophageal reflux disease)   . H/O cardiac radiofrequency ablation 08/2016  . Hyperlipidemia   . Hypertension   . S/P AVR (aortic valve replacement) 2010    Current Outpatient Medications   Medication Sig Dispense Refill  . aspirin EC 81 MG tablet Take 81 mg by mouth.    . fexofenadine (ALLEGRA) 180 MG tablet Take 180 mg by mouth daily.    Marland Kitchen omeprazole (PRILOSEC) 20 MG capsule Take 20 mg by mouth.    . rosuvastatin (CRESTOR) 10 MG tablet TAKE ONE TABLET BY MOUTH EVERY DAY IN THE EVENING 90 tablet 3  . sotalol (BETAPACE) 80 MG tablet Take 40 mg by mouth 2 (two) times daily.    . tamsulosin (FLOMAX) 0.4 MG CAPS capsule Take 0.4 mg by mouth.    . temazepam (RESTORIL) 15 MG capsule TAKE ONE CAPSULE BY MOUTH AT BEDTIME AS NEEDED    . triamterene-hydrochlorothiazide (MAXZIDE-25) 37.5-25 MG tablet TAKE ONE TABLET BY MOUTH EVERY DAY 90 tablet 1   No current facility-administered medications for this visit.     ALLERGIES:   Patient has no known allergies.   SOCIAL HISTORY:  The patient  reports that he has never smoked. He has never used smokeless tobacco. He reports that he does not drink alcohol or use drugs.   FAMILY HISTORY:  The patient's family history includes CAD in his brother.   REVIEW OF SYSTEMS:  See HPI.  All other systems are reviewed and negative.   PHYSICAL EXAM: VS:  BP (!) 142/84   Pulse 65   Ht 5\' 9"  (1.753 m)   Wt 189 lb 1.9 oz (85.8  kg)   SpO2 95%   BMI 27.93 kg/m  , BMI Body mass index is 27.93 kg/m. GEN: Well nourished, well developed, in no acute distress HEENT: normal Neck: No JVD. carotids 2+ without bruits or masses Cardiac: The heart is RRR with 3/6 harsh systolic murmur at the RUSB.  No edema. Pedal pulses 2+ = bilaterally  Respiratory:  clear to auscultation bilaterally GI: soft, nontender, nondistended, + BS MS: no deformity or atrophy Skin: warm and dry, no rash Neuro:  Strength and sensation are intact Psych: euthymic mood, full affect  EKG:  EKG from 04-02-2019 reviewed and demonstrates NSR, LVH with repolarization abnormality  RECENT LABS: No results found for requested labs within last 8760 hours.  No results found for requested  labs within last 8760 hours.   CrCl cannot be calculated (Patient's most recent lab result is older than the maximum 21 days allowed.).   Wt Readings from Last 3 Encounters:  05/31/19 189 lb 1.9 oz (85.8 kg)  05/16/19 190 lb (86.2 kg)  04/02/19 189 lb (85.7 kg)     CARDIAC STUDIES: Echo: IMPRESSIONS    1. The left ventricle has normal systolic function with an ejection fraction of 60-65%. The cavity size was normal. There is moderately increased left ventricular wall thickness. Left ventricular diastolic Doppler parameters are consistent with impaired  relaxation.  2. The right ventricle has normal systolic function. The cavity was normal. There is no increase in right ventricular wall thickness.  3. Left atrial size was mild-moderately dilated.  4. The mitral valve is grossly normal.  5. The tricuspid valve is grossly normal.  6. Aortic valve regurgitation was not assessed by color flow Doppler. Severe stenosis of the aortic valve.  7. The patient has a bioprosthetic valve.  8. Aneurysm of the ascending aorta, measuring 46 mm.   9. The aorta is abnormal unless otherwise noted.  LEFT VENTRICLE         +----------------+---------++ +--------------+--------++ Diastology                PLAX 2D                +----------------+---------++ +--------------+--------++ LV e' lateral:  6.09 cm/s LVIDd:        4.20 cm  +----------------+---------++ +--------------+--------++ LV E/e' lateral:12.3      LVIDs:        2.90 cm  +----------------+---------++ +--------------+--------++ LV e' medial:   4.57 cm/s LV PW:        1.40 cm  +----------------+---------++ +--------------+--------++ LV E/e' medial: 16.5      LV IVS:       1.50 cm  +----------------+---------++ +--------------+--------++ LVOT diam:    2.20 cm  +--------------+--------++ LV SV:        46 ml    +--------------+--------++ LV SV Index:  22.56     +--------------+--------++ LVOT Area:    3.80 cm +--------------+--------++                        +--------------+--------++  +---------------+---------++ RIGHT VENTRICLE          +---------------+---------++ RV S prime:    8.05 cm/s +---------------+---------++ TAPSE (M-mode):1.6 cm    +---------------+---------++  +---------------+-------++-----------++ LEFT ATRIUM           Index       +---------------+-------++-----------++ LA diam:       4.50 cm2.24 cm/m  +---------------+-------++-----------++ LA Vol (A2C):  61.5 ml30.66 ml/m +---------------+-------++-----------++ LA Vol (A4C):  90.6 ml45.16 ml/m +---------------+-------++-----------++  LA Biplane Vol:76.3 ml38.03 ml/m +---------------+-------++-----------++ +------------+---------++-----------++ RIGHT ATRIUM         Index       +------------+---------++-----------++ RA Area:    17.50 cm            +------------+---------++-----------++ RA Volume:  38.50 ml 19.19 ml/m +------------+---------++-----------++  +------------------+------------++ AORTIC VALVE                   +------------------+------------++ AV Area (Vmax):   0.89 cm     +------------------+------------++ AV Area (Vmean):  0.93 cm     +------------------+------------++ AV Area (VTI):    1.00 cm     +------------------+------------++ AV Vmax:          386.50 cm/s  +------------------+------------++ AV Vmean:         284.500 cm/s +------------------+------------++ AV VTI:           0.866 m      +------------------+------------++ AV Peak Grad:     59.8 mmHg    +------------------+------------++ AV Mean Grad:     36.5 mmHg    +------------------+------------++ LVOT Vmax:        90.90 cm/s   +------------------+------------++ LVOT Vmean:       69.600 cm/s  +------------------+------------++ LVOT VTI:         0.228 m       +------------------+------------++ LVOT/AV VTI ratio:0.26         +------------------+------------++   +-------------+-------++ AORTA                +-------------+-------++ Ao Root diam:4.20 cm +-------------+-------++ Ao Asc diam: 4.70 cm +-------------+-------++  +--------------+----------++  +---------------+-----------++ MITRAL VALVE              TRICUSPID VALVE            +--------------+----------++  +---------------+-----------++ MV Area (PHT):2.26 cm    TR Peak grad:  9.4 mmHg    +--------------+----------++  +---------------+-----------++ MV PHT:       97.15 msec  TR Vmax:       153.00 cm/s +--------------+----------++  +---------------+-----------++ MV Decel Time:335 msec   +--------------+----------++  +--------------+-------+ +--------------+-----------++ SHUNTS                MV E velocity:75.20 cm/s  +--------------+-------+ +--------------+-----------++ Systemic VTI: 0.23 m  MV A velocity:108.00 cm/s +--------------+-------+ +--------------+-----------++ Systemic Diam:2.20 cm MV E/A ratio: 0.70        +--------------+-------+ +--------------+-----------++   Cardiac Cath: pending  STS RISK CALCULATOR: Redo aortic valve replacement: Risk of Mortality: 1.129% Renal Failure: 1.326% Permanent Stroke: 1.556% Prolonged Ventilation: 4.238% DSW Infection: 0.106% Reoperation: 3.328% Morbidity or Mortality: 7.595% Short Length of Stay: 49.567% Long Length of Stay: 3.426%  ASSESSMENT AND PLAN: 1.  Severe, Stage C1, bioprosthetic valve aortic stenosis.   I have reviewed the natural history of aortic stenosis with the patient and their family members who are present today. We have discussed the limitations of medical therapy and the poor prognosis associated with symptomatic aortic stenosis. We have reviewed potential treatment options, including palliative medical therapy, conventional  surgical aortic valve replacement, and transcatheter aortic valve replacement. We discussed treatment options in the context of the patient's specific comorbid medical conditions.    This patient has progressive bioprosthetic aortic valve dysfunction. Peak and mean gradients have increased from 43/22 mmHg in November 2019 to 60/37 mmHg on his recent echo study. I have reviewed the echo images and the aortic valve leaflets are visibly thickened and restricted. He appears to be asymptomatic at this point, but with  prosthetic dysfunction I would expect his valve dysfunction to continue to progress over the next 6-12 months. We discussed options of moving forward with evaluation now versus a period of watchful waiting and serial echo studies every 6 months. We decided to proceed with CTA studies of the heart and the chest/abdomen/pelvis to better define his treatment options as he will clearly need either redo AVR or valve-in-valve TAVR in the relatively near future. I demonstrated procedural videos of TAVR surgery to the patient and his wife today. We discussed procedural risks and expectation as well as typical recovery.  I will discuss his case with Dr Geraldo Pitter and Dr Cyndia Bent as part of a multidisciplinary approach to his care. Our team will be in touch with him after his CTA studies are completed.   Deatra James 05/31/2019 5:46 PM     [place] [address] [place] [address]  [phone] (office) [phone] (fax)

## 2019-05-31 NOTE — Patient Instructions (Addendum)
Medication Instructions:  Your provider recommends that you continue on your current medications as directed. Please refer to the Current Medication list given to you today.    Labwork: TODAY!  Testing/Procedures: Dr. Burt Knack recommends you have CT scans to look at your valve.  Follow-Up: We will call you for further follow-up.   Any Other Special Instructions Will Be Listed Below (If Applicable). Your cardiac CT will be scheduled at one of the below locations:   [facility] [address] [place], [place] [address] [phone]  Please arrive at the [facility] main entrance of [facility] 30-45 minutes prior to test start time. Proceed to the [facility] Radiology Department (first floor) to check-in and test prep.   Hold all erectile dysfunction medications at least 3 days (72 hrs) prior to test.  On the Night Before the Test: . Be sure to Drink plenty of water. . Do not consume any caffeinated/decaffeinated beverages or chocolate 12 hours prior to your test. . Do not take any antihistamines 12 hours prior to your test.  On the Day of the Test: . Drink plenty of water. Do not drink any water within one hour of the test. . Do not eat any food 4 hours prior to the test. . You may take your regular medications prior to the test.  . Take metoprolol (Lopressor) two hours prior to test. . HOLD Maxzide (Triamterene Hydrochlorothiazide) the morning of the test.       After the Test: . Drink plenty of water. . After receiving IV contrast, you may experience a mild flushed feeling. This is normal. . On occasion, you may experience a mild rash up to 24 hours after the test. This is not dangerous. If this occurs, you can take Benadryl 25 mg and increase your fluid intake. . If you experience trouble breathing, this can be serious. If it is severe call [phone] IMMEDIATELY. If it is mild, please call our office.   Please contact the cardiac imaging nurse navigator  should you have any questions/concerns Marchia Bond, RN Navigator [facility] [phone] Office  [phone] Cell

## 2019-05-31 NOTE — Progress Notes (Signed)
[facility]   MULTIDISCIPLINARY HEART VALVE TEAM  Date:  05/31/2019   ID:  LYNELL MANSOOR, DOB Nov 24, 1945, MRN [id number]  PCP:  Nicoletta Dress, MD   Chief Complaint  Patient presents with  . Aortic Stenosis     HISTORY OF PRESENT ILLNESS: Randall Edwards is a 73 y.o. male who presents for evaluation of severe bioprosthetic aortic stenosis, referred by Dr Geraldo Pitter.  The patient is here with his wife today. He initially underwent surgical aortic valve replacement in 2010 by Dr Cyndia Bent, at [facility]. I do not have records from his original surgery, but he reports that he had a 'leaky aortic valve.' He also underwent repair of the ascending aorta at the time of his surgery. He reports no problems at the time of surgery and he had an uneventful recovery. He has a card that shows he was treated with a 25 mm Edwards pericardial tissue valve, model [id number].  The patient has had regular dental care and reports no problems with his teeth.  About 6 weeks ago he developed profound fatigue, but thought it was secondary to a sinus problem. He reports a kenalog shot recently and feels much better since he had this. He complains of fatigue with moderate activity, such as yard work. He denies dyspnea or chest pain. No edema. He has no history of stroke or TIA. He reports rare dizziness. He has a history of atrial fibrillation and apparently underwent afib ablation in 2017. He is not anticoagulated and he takes sotalol for rhythm control. He has arthritis in his hips, back, shoulder, and hands.   The patient is a retired Engineer, structural. He has one grown son and 2 grandsons, ages 3 and 81.   Past Medical History:  Diagnosis Date  . Dysrhythmia    atrial tach, a-fib  . GERD (gastroesophageal reflux disease)   . H/O cardiac radiofrequency ablation 08/2016  . Hyperlipidemia   . Hypertension   . S/P AVR (aortic valve replacement) 2010    Current Outpatient Medications   Medication Sig Dispense Refill  . aspirin EC 81 MG tablet Take 81 mg by mouth.    . fexofenadine (ALLEGRA) 180 MG tablet Take 180 mg by mouth daily.    Marland Kitchen omeprazole (PRILOSEC) 20 MG capsule Take 20 mg by mouth.    . rosuvastatin (CRESTOR) 10 MG tablet TAKE ONE TABLET BY MOUTH EVERY DAY IN THE EVENING 90 tablet 3  . sotalol (BETAPACE) 80 MG tablet Take 40 mg by mouth 2 (two) times daily.    . tamsulosin (FLOMAX) 0.4 MG CAPS capsule Take 0.4 mg by mouth.    . temazepam (RESTORIL) 15 MG capsule TAKE ONE CAPSULE BY MOUTH AT BEDTIME AS NEEDED    . triamterene-hydrochlorothiazide (MAXZIDE-25) 37.5-25 MG tablet TAKE ONE TABLET BY MOUTH EVERY DAY 90 tablet 1   No current facility-administered medications for this visit.     ALLERGIES:   Patient has no known allergies.   SOCIAL HISTORY:  The patient  reports that he has never smoked. He has never used smokeless tobacco. He reports that he does not drink alcohol or use drugs.   FAMILY HISTORY:  The patient's family history includes CAD in his brother.   REVIEW OF SYSTEMS:  See HPI.  All other systems are reviewed and negative.   PHYSICAL EXAM: VS:  BP (!) 142/84   Pulse 65   Ht 5\' 9"  (1.753 m)   Wt 189 lb 1.9 oz (85.8  kg)   SpO2 95%   BMI 27.93 kg/m  , BMI Body mass index is 27.93 kg/m. GEN: Well nourished, well developed, in no acute distress HEENT: normal Neck: No JVD. carotids 2+ without bruits or masses Cardiac: The heart is RRR with 3/6 harsh systolic murmur at the RUSB.  No edema. Pedal pulses 2+ = bilaterally  Respiratory:  clear to auscultation bilaterally GI: soft, nontender, nondistended, + BS MS: no deformity or atrophy Skin: warm and dry, no rash Neuro:  Strength and sensation are intact Psych: euthymic mood, full affect  EKG:  EKG from 04-02-2019 reviewed and demonstrates NSR, LVH with repolarization abnormality  RECENT LABS: No results found for requested labs within last 8760 hours.  No results found for requested  labs within last 8760 hours.   CrCl cannot be calculated (Patient's most recent lab result is older than the maximum 21 days allowed.).   Wt Readings from Last 3 Encounters:  05/31/19 189 lb 1.9 oz (85.8 kg)  05/16/19 190 lb (86.2 kg)  04/02/19 189 lb (85.7 kg)     CARDIAC STUDIES: Echo: IMPRESSIONS    1. The left ventricle has normal systolic function with an ejection fraction of 60-65%. The cavity size was normal. There is moderately increased left ventricular wall thickness. Left ventricular diastolic Doppler parameters are consistent with impaired  relaxation.  2. The right ventricle has normal systolic function. The cavity was normal. There is no increase in right ventricular wall thickness.  3. Left atrial size was mild-moderately dilated.  4. The mitral valve is grossly normal.  5. The tricuspid valve is grossly normal.  6. Aortic valve regurgitation was not assessed by color flow Doppler. Severe stenosis of the aortic valve.  7. The patient has a bioprosthetic valve.  8. Aneurysm of the ascending aorta, measuring 46 mm.   9. The aorta is abnormal unless otherwise noted.  LEFT VENTRICLE         +----------------+---------++ +--------------+--------++ Diastology                PLAX 2D                +----------------+---------++ +--------------+--------++ LV e' lateral:  6.09 cm/s LVIDd:        4.20 cm  +----------------+---------++ +--------------+--------++ LV E/e' lateral:12.3      LVIDs:        2.90 cm  +----------------+---------++ +--------------+--------++ LV e' medial:   4.57 cm/s LV PW:        1.40 cm  +----------------+---------++ +--------------+--------++ LV E/e' medial: 16.5      LV IVS:       1.50 cm  +----------------+---------++ +--------------+--------++ LVOT diam:    2.20 cm  +--------------+--------++ LV SV:        46 ml    +--------------+--------++ LV SV Index:  22.56     +--------------+--------++ LVOT Area:    3.80 cm +--------------+--------++                        +--------------+--------++  +---------------+---------++ RIGHT VENTRICLE          +---------------+---------++ RV S prime:    8.05 cm/s +---------------+---------++ TAPSE (M-mode):1.6 cm    +---------------+---------++  +---------------+-------++-----------++ LEFT ATRIUM           Index       +---------------+-------++-----------++ LA diam:       4.50 cm2.24 cm/m  +---------------+-------++-----------++ LA Vol (A2C):  61.5 ml30.66 ml/m +---------------+-------++-----------++ LA Vol (A4C):  90.6 ml45.16 ml/m +---------------+-------++-----------++  LA Biplane Vol:76.3 ml38.03 ml/m +---------------+-------++-----------++ +------------+---------++-----------++ RIGHT ATRIUM         Index       +------------+---------++-----------++ RA Area:    17.50 cm            +------------+---------++-----------++ RA Volume:  38.50 ml 19.19 ml/m +------------+---------++-----------++  +------------------+------------++ AORTIC VALVE                   +------------------+------------++ AV Area (Vmax):   0.89 cm     +------------------+------------++ AV Area (Vmean):  0.93 cm     +------------------+------------++ AV Area (VTI):    1.00 cm     +------------------+------------++ AV Vmax:          386.50 cm/s  +------------------+------------++ AV Vmean:         284.500 cm/s +------------------+------------++ AV VTI:           0.866 m      +------------------+------------++ AV Peak Grad:     59.8 mmHg    +------------------+------------++ AV Mean Grad:     36.5 mmHg    +------------------+------------++ LVOT Vmax:        90.90 cm/s   +------------------+------------++ LVOT Vmean:       69.600 cm/s  +------------------+------------++ LVOT VTI:         0.228 m       +------------------+------------++ LVOT/AV VTI ratio:0.26         +------------------+------------++   +-------------+-------++ AORTA                +-------------+-------++ Ao Root diam:4.20 cm +-------------+-------++ Ao Asc diam: 4.70 cm +-------------+-------++  +--------------+----------++  +---------------+-----------++ MITRAL VALVE              TRICUSPID VALVE            +--------------+----------++  +---------------+-----------++ MV Area (PHT):2.26 cm    TR Peak grad:  9.4 mmHg    +--------------+----------++  +---------------+-----------++ MV PHT:       97.15 msec  TR Vmax:       153.00 cm/s +--------------+----------++  +---------------+-----------++ MV Decel Time:335 msec   +--------------+----------++  +--------------+-------+ +--------------+-----------++ SHUNTS                MV E velocity:75.20 cm/s  +--------------+-------+ +--------------+-----------++ Systemic VTI: 0.23 m  MV A velocity:108.00 cm/s +--------------+-------+ +--------------+-----------++ Systemic Diam:2.20 cm MV E/A ratio: 0.70        +--------------+-------+ +--------------+-----------++   Cardiac Cath: pending  STS RISK CALCULATOR: Redo aortic valve replacement: Risk of Mortality: 1.129% Renal Failure: 1.326% Permanent Stroke: 1.556% Prolonged Ventilation: 4.238% DSW Infection: 0.106% Reoperation: 3.328% Morbidity or Mortality: 7.595% Short Length of Stay: 49.567% Long Length of Stay: 3.426%  ASSESSMENT AND PLAN: 1.  Severe, Stage C1, bioprosthetic valve aortic stenosis.   I have reviewed the natural history of aortic stenosis with the patient and their family members who are present today. We have discussed the limitations of medical therapy and the poor prognosis associated with symptomatic aortic stenosis. We have reviewed potential treatment options, including palliative medical therapy, conventional  surgical aortic valve replacement, and transcatheter aortic valve replacement. We discussed treatment options in the context of the patient's specific comorbid medical conditions.    This patient has progressive bioprosthetic aortic valve dysfunction. Peak and mean gradients have increased from 43/22 mmHg in November 2019 to 60/37 mmHg on his recent echo study. I have reviewed the echo images and the aortic valve leaflets are visibly thickened and restricted. He appears to be asymptomatic at this point, but with  prosthetic dysfunction I would expect his valve dysfunction to continue to progress over the next 6-12 months. We discussed options of moving forward with evaluation now versus a period of watchful waiting and serial echo studies every 6 months. We decided to proceed with CTA studies of the heart and the chest/abdomen/pelvis to better define his treatment options as he will clearly need either redo AVR or valve-in-valve TAVR in the relatively near future. I demonstrated procedural videos of TAVR surgery to the patient and his wife today. We discussed procedural risks and expectation as well as typical recovery.  I will discuss his case with Dr Geraldo Pitter and Dr Cyndia Bent as part of a multidisciplinary approach to his care. Our team will be in touch with him after his CTA studies are completed.   Deatra James 05/31/2019 5:46 PM     [place] [address] [place] [address]  [phone] (office) [phone] (fax)

## 2019-06-01 ENCOUNTER — Telehealth: Payer: Self-pay

## 2019-06-01 LAB — BASIC METABOLIC PANEL
BUN/Creatinine Ratio: 19 (ref 10–24)
BUN: 17 mg/dL (ref 8–27)
CO2: 23 mmol/L (ref 20–29)
Calcium: 10.1 mg/dL (ref 8.6–10.2)
Chloride: 103 mmol/L (ref 96–106)
Creatinine, Ser: 0.88 mg/dL (ref 0.76–1.27)
GFR calc Af Amer: 99 mL/min/{1.73_m2} (ref >59)
GFR calc non Af Amer: 86 mL/min/{1.73_m2} (ref >59)
Glucose: 71 mg/dL (ref 65–99)
Potassium: 3.9 mmol/L (ref 3.5–5.2)
Sodium: 142 mmol/L (ref 134–144)

## 2019-06-01 MED ORDER — DIAZEPAM 5 MG PO TABS: ORAL_TABLET | ORAL | 0 refills | Status: DC

## 2019-06-01 NOTE — Telephone Encounter (Signed)
TAVR scans scheduled 9/29 at 1030 for Dr. Johnsie Cancel to read.  Instructions have been given. Per Dr. Burt Knack, Valium 5 mg (2 tablets) sent it to take 1 hour prior to CT scan. The patient will take one tablet and the second if needed. Ms. Scipio understands she will need to drive him. She was grateful for call and agrees with treatment plan.

## 2019-06-05 ENCOUNTER — Telehealth: Payer: Self-pay | Admitting: Cardiovascular Disease

## 2019-06-05 NOTE — Telephone Encounter (Signed)
° ° °  Patient calling to discuss TAVR

## 2019-06-05 NOTE — Telephone Encounter (Signed)
I spoke to the patient and his wife.  They called because the patient is ready to schedule/discuss TAVR procedure with Dr Antionette Char nurse.  I informed them that she will reach out to them on 9/23.  They verbalized understanding and thanked me for the call.

## 2019-06-06 NOTE — Telephone Encounter (Signed)
The patient called to see if his wife can come in with him for his CT scans since she will have to drive him anyway (he was given Valium to take prior to scans). Confirmed with CT navigator that the patient may have one visitor in the Radiology waiting room. Patient notified.  The patient also wanted to relay to the Structural Team that he would like TAVR expedited if possible after scans 9/29 because his wife needs knee surgery soon afterward.  He understands the team will be notified of his request.

## 2019-06-08 ENCOUNTER — Encounter: Payer: Self-pay | Admitting: Physician Assistant

## 2019-06-08 ENCOUNTER — Other Ambulatory Visit: Payer: Self-pay | Admitting: Physician Assistant

## 2019-06-08 DIAGNOSIS — T8209XD Other mechanical complication of heart valve prosthesis, subsequent encounter: Secondary | ICD-10-CM

## 2019-06-08 NOTE — Telephone Encounter (Signed)
Discussed with K. Grandville Silos, [place]. Scheduled patient for [facility] with Dr. Burt Knack 10/7.  Joellen Jersey will review instructions with patient when he has his carotids and CT scans 9/29. Instruction letter is available in "letters" tab.

## 2019-06-12 ENCOUNTER — Ambulatory Visit (HOSPITAL_COMMUNITY)
Admission: RE | Admit: 2019-06-12 | Discharge: 2019-06-12 | Disposition: A | Discharge status: Home / Self Care | Payer: PPO | Source: Ambulatory Visit | Attending: Cardiovascular Disease | Admitting: Cardiovascular Disease

## 2019-06-12 ENCOUNTER — Other Ambulatory Visit: Payer: Self-pay

## 2019-06-12 ENCOUNTER — Ambulatory Visit (HOSPITAL_BASED_OUTPATIENT_CLINIC_OR_DEPARTMENT_OTHER)
Admission: RE | Admit: 2019-06-12 | Discharge: 2019-06-12 | Disposition: A | Discharge status: Home / Self Care | Payer: PPO | Source: Ambulatory Visit

## 2019-06-12 ENCOUNTER — Encounter (HOSPITAL_COMMUNITY): Payer: Self-pay

## 2019-06-12 DIAGNOSIS — I350 Nonrheumatic aortic (valve) stenosis: Secondary | ICD-10-CM

## 2019-06-12 DIAGNOSIS — K5730 Diverticulosis of large intestine without perforation or abscess without bleeding: Secondary | ICD-10-CM | POA: Diagnosis not present

## 2019-06-12 DIAGNOSIS — T8209XD Other mechanical complication of heart valve prosthesis, subsequent encounter: Secondary | ICD-10-CM

## 2019-06-12 DIAGNOSIS — Z952 Presence of prosthetic heart valve: Secondary | ICD-10-CM | POA: Diagnosis not present

## 2019-06-12 DIAGNOSIS — Z01818 Encounter for other preprocedural examination: Secondary | ICD-10-CM | POA: Diagnosis not present

## 2019-06-12 MED ORDER — IOHEXOL 350 MG/ML SOLN
100.0000 mL | Freq: Once | INTRAVENOUS | Status: AC | PRN
  Administered 2019-06-12: 10:00:00 100 mL via INTRAVENOUS

## 2019-06-12 NOTE — Progress Notes (Signed)
Carotid duplex has been completed.   Preliminary results in CV Proc.   Abram Sander 06/12/2019 9:15 AM

## 2019-06-14 DIAGNOSIS — Z23 Encounter for immunization: Secondary | ICD-10-CM | POA: Diagnosis not present

## 2019-06-16 ENCOUNTER — Other Ambulatory Visit (HOSPITAL_COMMUNITY)
Admission: RE | Admit: 2019-06-16 | Discharge: 2019-06-16 | Disposition: A | Discharge status: Home / Self Care | Payer: PPO | Source: Ambulatory Visit | Attending: Cardiovascular Disease | Admitting: Cardiovascular Disease

## 2019-06-16 DIAGNOSIS — Z20828 Contact with and (suspected) exposure to other viral communicable diseases: Secondary | ICD-10-CM | POA: Diagnosis not present

## 2019-06-16 DIAGNOSIS — Z01812 Encounter for preprocedural laboratory examination: Secondary | ICD-10-CM | POA: Insufficient documentation

## 2019-06-18 LAB — NOVEL CORONAVIRUS, NAA (HOSP ORDER, SEND-OUT TO REF LAB; TAT 18-24 HRS): SARS-CoV-2, NAA: NOT DETECTED

## 2019-06-19 ENCOUNTER — Telehealth: Payer: Self-pay | Admitting: *Deleted

## 2019-06-19 NOTE — Telephone Encounter (Signed)
Pt contacted pre-catheterization scheduled at [facility] for:  Wednesday June 20, 2019 12:30 PM Verified arrival time and place: [facility]) at: 10 AM-needs lab.   No solid food after midnight prior to cath, clear liquids until 5 AM day of procedure. Contrast allergy: no   Hold: Triamterene/HCT-AM of procedure.  Except hold medications AM meds can be  taken pre-cath with sip of water including: ASA 81 mg   Confirmed patient has responsible adult to drive home post procedure and observe 24 hours after arriving home: yes  Currently, due to Covid-19 pandemic, only one support person will be allowed with patient. Must be the same support person for that patient's entire stay, will be screened and required to wear a mask. They will be asked to wait in the waiting room for the duration of the patient's stay.  Patients are required to wear a mask when they enter the hospital.      COVID-19 Pre-Screening Questions:  . In the past 7 to 10 days have you had a cough,  shortness of breath, headache, congestion, fever (100 or greater) body aches, chills, sore throat, or sudden loss of taste or sense of smell? no . Have you been around anyone with known Covid 19? no . Have you been around anyone who is awaiting Covid 19 test results in the past 7 to 10 days? no . Have you been around anyone who has been exposed to Covid 19, or has mentioned symptoms of Covid 19 within the past 7 to 10 days? no  I reviewed procedure/mask/visitor instructions, Covid-19 screening questions with patient, he verbalized understanding, thanked me for call.

## 2019-06-20 ENCOUNTER — Encounter (HOSPITAL_COMMUNITY)
Admission: RE | Disposition: A | Discharge status: Home / Self Care | Payer: Self-pay | Source: Home / Self Care | Attending: Cardiovascular Disease

## 2019-06-20 ENCOUNTER — Ambulatory Visit (HOSPITAL_COMMUNITY)
Admission: RE | Admit: 2019-06-20 | Discharge: 2019-06-20 | Disposition: A | Discharge status: Home / Self Care | Payer: PPO | Attending: Cardiovascular Disease | Admitting: Cardiovascular Disease

## 2019-06-20 ENCOUNTER — Other Ambulatory Visit: Payer: Self-pay

## 2019-06-20 DIAGNOSIS — Z8249 Family history of ischemic heart disease and other diseases of the circulatory system: Secondary | ICD-10-CM | POA: Diagnosis not present

## 2019-06-20 DIAGNOSIS — Z952 Presence of prosthetic heart valve: Secondary | ICD-10-CM | POA: Diagnosis not present

## 2019-06-20 DIAGNOSIS — I352 Nonrheumatic aortic (valve) stenosis with insufficiency: Secondary | ICD-10-CM | POA: Insufficient documentation

## 2019-06-20 DIAGNOSIS — I350 Nonrheumatic aortic (valve) stenosis: Secondary | ICD-10-CM

## 2019-06-20 DIAGNOSIS — I4891 Unspecified atrial fibrillation: Secondary | ICD-10-CM | POA: Insufficient documentation

## 2019-06-20 DIAGNOSIS — I712 Thoracic aortic aneurysm, without rupture: Secondary | ICD-10-CM | POA: Insufficient documentation

## 2019-06-20 DIAGNOSIS — K219 Gastro-esophageal reflux disease without esophagitis: Secondary | ICD-10-CM | POA: Diagnosis not present

## 2019-06-20 DIAGNOSIS — Z79899 Other long term (current) drug therapy: Secondary | ICD-10-CM | POA: Insufficient documentation

## 2019-06-20 DIAGNOSIS — I10 Essential (primary) hypertension: Secondary | ICD-10-CM | POA: Diagnosis not present

## 2019-06-20 DIAGNOSIS — Z7982 Long term (current) use of aspirin: Secondary | ICD-10-CM | POA: Diagnosis not present

## 2019-06-20 DIAGNOSIS — E785 Hyperlipidemia, unspecified: Secondary | ICD-10-CM | POA: Diagnosis not present

## 2019-06-20 HISTORY — PX: RIGHT HEART CATH AND CORONARY ANGIOGRAPHY: CATH118264

## 2019-06-20 LAB — BASIC METABOLIC PANEL
Anion gap: 8 (ref 5–15)
BUN: 13 mg/dL (ref 8–23)
CO2: 26 mmol/L (ref 22–32)
Calcium: 9.2 mg/dL (ref 8.9–10.3)
Chloride: 106 mmol/L (ref 98–111)
Creatinine, Ser: 0.89 mg/dL (ref 0.61–1.24)
GFR calc Af Amer: >60 mL/min (ref >60)
GFR calc non Af Amer: >60 mL/min (ref >60)
Glucose, Bld: 101 mg/dL — ABNORMAL HIGH (ref 70–99)
Potassium: 3.4 mmol/L — ABNORMAL LOW (ref 3.5–5.1)
Sodium: 140 mmol/L (ref 135–145)

## 2019-06-20 LAB — POCT I-STAT 7, (LYTES, BLD GAS, ICA,H+H)
Acid-Base Excess: 1 mmol/L (ref 0.0–2.0)
Bicarbonate: 24.5 mmol/L (ref 20.0–28.0)
Calcium, Ion: 1.23 mmol/L (ref 1.15–1.40)
HCT: 44 % (ref 39.0–52.0)
Hemoglobin: 15 g/dL (ref 13.0–17.0)
O2 Saturation: 99 %
Potassium: 3.6 mmol/L (ref 3.5–5.1)
Sodium: 141 mmol/L (ref 135–145)
TCO2: 25 mmol/L (ref 22–32)
pCO2 arterial: 34.8 mmHg (ref 32.0–48.0)
pH, Arterial: 7.455 — ABNORMAL HIGH (ref 7.350–7.450)
pO2, Arterial: 131 mmHg — ABNORMAL HIGH (ref 83.0–108.0)

## 2019-06-20 LAB — POCT I-STAT EG7
Bicarbonate: 25 mmol/L (ref 20.0–28.0)
Calcium, Ion: 1.16 mmol/L (ref 1.15–1.40)
HCT: 42 % (ref 39.0–52.0)
Hemoglobin: 14.3 g/dL (ref 13.0–17.0)
O2 Saturation: 76 %
Potassium: 3.3 mmol/L — ABNORMAL LOW (ref 3.5–5.1)
Sodium: 142 mmol/L (ref 135–145)
TCO2: 26 mmol/L (ref 22–32)
pCO2, Ven: 41.2 mmHg — ABNORMAL LOW (ref 44.0–60.0)
pH, Ven: 7.391 (ref 7.250–7.430)
pO2, Ven: 41 mmHg (ref 32.0–45.0)

## 2019-06-20 LAB — CBC
HCT: 49.1 % (ref 39.0–52.0)
Hemoglobin: 16.8 g/dL (ref 13.0–17.0)
MCH: 32.4 pg (ref 26.0–34.0)
MCHC: 34.2 g/dL (ref 30.0–36.0)
MCV: 94.8 fL (ref 80.0–100.0)
Platelets: 140 10*3/uL — ABNORMAL LOW (ref 150–400)
RBC: 5.18 MIL/uL (ref 4.22–5.81)
RDW: 12.6 % (ref 11.5–15.5)
WBC: 8.3 10*3/uL (ref 4.0–10.5)
nRBC: 0 % (ref 0.0–0.2)

## 2019-06-20 SURGERY — RIGHT HEART CATH AND CORONARY ANGIOGRAPHY
Anesthesia: LOCAL

## 2019-06-20 MED ORDER — FENTANYL CITRATE (PF) 100 MCG/2ML IJ SOLN
INTRAMUSCULAR | Status: DC | PRN
  Administered 2019-06-20: 25 ug via INTRAVENOUS

## 2019-06-20 MED ORDER — SODIUM CHLORIDE 0.9 % WEIGHT BASED INFUSION: 1.0000 mL/kg/h | INTRAVENOUS | Status: DC

## 2019-06-20 MED ORDER — LIDOCAINE HCL (PF) 1 % IJ SOLN
INTRAMUSCULAR | Status: DC | PRN
  Administered 2019-06-20 (×2): 2 mL

## 2019-06-20 MED ORDER — VERAPAMIL HCL 2.5 MG/ML IV SOLN
INTRAVENOUS | Status: AC
  Filled 2019-06-20: qty 2

## 2019-06-20 MED ORDER — SODIUM CHLORIDE 0.9 % IV SOLN: 250.0000 mL | INTRAVENOUS | Status: DC | PRN

## 2019-06-20 MED ORDER — HYDRALAZINE HCL 20 MG/ML IJ SOLN: 10.0000 mg | INTRAMUSCULAR | Status: DC | PRN

## 2019-06-20 MED ORDER — HEPARIN SODIUM (PORCINE) 1000 UNIT/ML IJ SOLN
INTRAMUSCULAR | Status: AC
  Filled 2019-06-20: qty 1

## 2019-06-20 MED ORDER — ASPIRIN 81 MG PO CHEW: 81.0000 mg | CHEWABLE_TABLET | ORAL | Status: DC

## 2019-06-20 MED ORDER — HEPARIN (PORCINE) IN NACL 1000-0.9 UT/500ML-% IV SOLN
INTRAVENOUS | Status: DC | PRN
  Administered 2019-06-20 (×2): 500 mL

## 2019-06-20 MED ORDER — ONDANSETRON HCL 4 MG/2ML IJ SOLN: 4.0000 mg | Freq: Four times a day (QID) | INTRAMUSCULAR | Status: DC | PRN

## 2019-06-20 MED ORDER — MIDAZOLAM HCL 2 MG/2ML IJ SOLN
INTRAMUSCULAR | Status: AC
  Filled 2019-06-20: qty 2

## 2019-06-20 MED ORDER — ACETAMINOPHEN 325 MG PO TABS: 650.0000 mg | ORAL_TABLET | ORAL | Status: DC | PRN

## 2019-06-20 MED ORDER — LABETALOL HCL 5 MG/ML IV SOLN: 10.0000 mg | INTRAVENOUS | Status: DC | PRN

## 2019-06-20 MED ORDER — SODIUM CHLORIDE 0.9 % WEIGHT BASED INFUSION
3.0000 mL/kg/h | INTRAVENOUS | Status: AC
  Administered 2019-06-20: 12:00:00 3 mL/kg/h via INTRAVENOUS

## 2019-06-20 MED ORDER — HEPARIN SODIUM (PORCINE) 1000 UNIT/ML IJ SOLN
INTRAMUSCULAR | Status: DC | PRN
  Administered 2019-06-20: 4000 [IU] via INTRAVENOUS

## 2019-06-20 MED ORDER — MIDAZOLAM HCL 2 MG/2ML IJ SOLN
INTRAMUSCULAR | Status: DC | PRN
  Administered 2019-06-20: 1 mg via INTRAVENOUS

## 2019-06-20 MED ORDER — FENTANYL CITRATE (PF) 100 MCG/2ML IJ SOLN
INTRAMUSCULAR | Status: AC
  Filled 2019-06-20: qty 2

## 2019-06-20 MED ORDER — POTASSIUM CHLORIDE CRYS ER 20 MEQ PO TBCR
40.0000 meq | EXTENDED_RELEASE_TABLET | Freq: Once | ORAL | Status: AC
  Administered 2019-06-20: 40 meq via ORAL
  Filled 2019-06-20 (×2): qty 2

## 2019-06-20 MED ORDER — IOHEXOL 350 MG/ML SOLN
INTRAVENOUS | Status: DC | PRN
  Administered 2019-06-20: 60 mL via INTRA_ARTERIAL

## 2019-06-20 MED ORDER — SODIUM CHLORIDE 0.9% FLUSH: 3.0000 mL | INTRAVENOUS | Status: DC | PRN

## 2019-06-20 MED ORDER — VERAPAMIL HCL 2.5 MG/ML IV SOLN
INTRAVENOUS | Status: DC | PRN
  Administered 2019-06-20: 14:00:00 10 mL via INTRA_ARTERIAL

## 2019-06-20 MED ORDER — SODIUM CHLORIDE 0.9% FLUSH: 3.0000 mL | Freq: Two times a day (BID) | INTRAVENOUS | Status: DC

## 2019-06-20 MED ORDER — HEPARIN (PORCINE) IN NACL 1000-0.9 UT/500ML-% IV SOLN
INTRAVENOUS | Status: AC
  Filled 2019-06-20: qty 1000

## 2019-06-20 MED ORDER — LIDOCAINE HCL (PF) 1 % IJ SOLN
INTRAMUSCULAR | Status: AC
  Filled 2019-06-20: qty 30

## 2019-06-20 SURGICAL SUPPLY — 15 items
CATH 5FR JL3.5 JR4 ANG PIG MP (CATHETERS) ×2 IMPLANT
CATH BALLN WEDGE 5F 110CM (CATHETERS) ×2 IMPLANT
CATH EXPO 5F MPA-1 (CATHETERS) ×2 IMPLANT
CATH INFINITI 5FR AL1 (CATHETERS) ×2 IMPLANT
DEVICE RAD COMP TR BAND LRG (VASCULAR PRODUCTS) ×2 IMPLANT
GLIDESHEATH SLEND SS 6F .021 (SHEATH) ×2 IMPLANT
GUIDEWIRE .025 260CM (WIRE) ×2 IMPLANT
GUIDEWIRE INQWIRE 1.5J.035X260 (WIRE) ×1 IMPLANT
INQWIRE 1.5J .035X260CM (WIRE) ×2
KIT HEART LEFT (KITS) ×2 IMPLANT
PACK CARDIAC CATHETERIZATION (CUSTOM PROCEDURE TRAY) ×2 IMPLANT
SHEATH GLIDE SLENDER 4/5FR (SHEATH) ×2 IMPLANT
TRANSDUCER W/STOPCOCK (MISCELLANEOUS) ×2 IMPLANT
TUBING CIL FLEX 10 FLL-RA (TUBING) ×2 IMPLANT
WIRE EMERALD ST .035X150CM (WIRE) ×2 IMPLANT

## 2019-06-20 NOTE — Interval H&P Note (Signed)
History and Physical Interval Note:  06/20/2019 1:00 PM  Randall Edwards  has presented today for surgery, with the diagnosis of Aortic stenosis.  The various methods of treatment have been discussed with the patient and family. After consideration of risks, benefits and other options for treatment, the patient has consented to  Procedure(s): RIGHT/LEFT HEART CATH AND CORONARY ANGIOGRAPHY (N/A) as a surgical intervention.  The patient's history has been reviewed, patient examined, no change in status, stable for surgery.  I have reviewed the patient's chart and labs.  Questions were answered to the patient's satisfaction.     Sherren Mocha

## 2019-06-20 NOTE — Discharge Instructions (Signed)
Radial Site Care  This sheet gives you information about how to care for yourself after your procedure. Your health care provider may also give you more specific instructions. If you have problems or questions, contact your health care provider. What can I expect after the procedure? After the procedure, it is common to have:  Bruising and tenderness at the catheter insertion area. Follow these instructions at home: Medicines  Take over-the-counter and prescription medicines only as told by your health care provider. Insertion site care  Follow instructions from your health care provider about how to take care of your insertion site. Make sure you: ? Wash your hands with soap and water before you change your bandage (dressing). If soap and water are not available, use hand sanitizer. ? Change your dressing as told by your health care provider. ? Leave stitches (sutures), skin glue, or adhesive strips in place. These skin closures may need to stay in place for 2 weeks or longer. If adhesive strip edges start to loosen and curl up, you may trim the loose edges. Do not remove adhesive strips completely unless your health care provider tells you to do that.  Check your insertion site every day for signs of infection. Check for: ? Redness, swelling, or pain. ? Fluid or blood. ? Pus or a bad smell. ? Warmth.  Do not take baths, swim, or use a hot tub until your health care provider approves.  You may shower 24-48 hours after the procedure, or as directed by your health care provider. ? Remove the dressing and gently wash the site with plain soap and water. ? Pat the area dry with a clean towel. ? Do not rub the site. That could cause bleeding.  Do not apply powder or lotion to the site. Activity   For 24 hours after the procedure, or as directed by your health care provider: ? Do not flex or bend the affected arm. ? Do not push or pull heavy objects with the affected arm. ? Do not  drive yourself home from the hospital or clinic. You may drive 24 hours after the procedure unless your health care provider tells you not to. ? Do not operate machinery or power tools.  Do not lift anything that is heavier than 10 lb (4.5 kg), or the limit that you are told, until your health care provider says that it is safe.  Ask your health care provider when it is okay to: ? Return to work or school. ? Resume usual physical activities or sports. ? Resume sexual activity. General instructions  If the catheter site starts to bleed, raise your arm and put firm pressure on the site. If the bleeding does not stop, get help right away. This is a medical emergency.  If you went home on the same day as your procedure, a responsible adult should be with you for the first 24 hours after you arrive home.  Keep all follow-up visits as told by your health care provider. This is important. Contact a health care provider if:  You have a fever.  You have redness, swelling, or yellow drainage around your insertion site. Get help right away if:  You have unusual pain at the radial site.  The catheter insertion area swells very fast.  The insertion area is bleeding, and the bleeding does not stop when you hold steady pressure on the area.  Your arm or hand becomes pale, cool, tingly, or numb. These symptoms may represent a serious problem   that is an emergency. Do not wait to see if the symptoms will go away. Get medical help right away. Call your local emergency services (911 in the [place] Do not drive yourself to the hospital. Summary  After the procedure, it is common to have bruising and tenderness at the site.  Follow instructions from your health care provider about how to take care of your radial site wound. Check the wound every day for signs of infection.  Do not lift anything that is heavier than 10 lb (4.5 kg), or the limit that you are told, until your health care provider says  that it is safe. This information is not intended to replace advice given to you by your health care provider. Make sure you discuss any questions you have with your health care provider. Document Released: 10/02/2010 Document Revised: 10/05/2017 Document Reviewed: 10/05/2017 Elsevier Patient Education  2020 [employer]  Moderate Conscious Sedation, Adult, Care After These instructions provide you with information about caring for yourself after your procedure. Your health care provider may also give you more specific instructions. Your treatment has been planned according to current medical practices, but problems sometimes occur. Call your health care provider if you have any problems or questions after your procedure. What can I expect after the procedure? After your procedure, it is common:  To feel sleepy for several hours.  To feel clumsy and have poor balance for several hours.  To have poor judgment for several hours.  To vomit if you eat too soon. Follow these instructions at home: For at least 24 hours after the procedure:   Do not: ? Participate in activities where you could fall or become injured. ? Drive. ? Use heavy machinery. ? Drink alcohol. ? Take sleeping pills or medicines that cause drowsiness. ? Make important decisions or sign legal documents. ? Take care of children on your own.  Rest. Eating and drinking  Follow the diet recommended by your health care provider.  If you vomit: ? Drink water, juice, or soup when you can drink without vomiting. ? Make sure you have little or no nausea before eating solid foods. General instructions  Have a responsible adult stay with you until you are awake and alert.  Take over-the-counter and prescription medicines only as told by your health care provider.  If you smoke, do not smoke without supervision.  Keep all follow-up visits as told by your health care provider. This is important. Contact a health care  provider if:  You keep feeling nauseous or you keep vomiting.  You feel light-headed.  You develop a rash.  You have a fever. Get help right away if:  You have trouble breathing. This information is not intended to replace advice given to you by your health care provider. Make sure you discuss any questions you have with your health care provider. Document Released: 06/20/2013 Document Revised: 08/12/2017 Document Reviewed: 12/20/2015 Elsevier Patient Education  2020 [employer].  

## 2019-06-21 ENCOUNTER — Encounter (HOSPITAL_COMMUNITY): Payer: Self-pay | Admitting: Cardiovascular Disease

## 2019-06-25 ENCOUNTER — Encounter: Payer: Self-pay | Admitting: Physician Assistant

## 2019-06-26 ENCOUNTER — Other Ambulatory Visit: Payer: Self-pay

## 2019-06-26 DIAGNOSIS — I350 Nonrheumatic aortic (valve) stenosis: Secondary | ICD-10-CM

## 2019-06-27 ENCOUNTER — Other Ambulatory Visit: Payer: Self-pay

## 2019-06-27 ENCOUNTER — Institutional Professional Consult (permissible substitution): Payer: PPO | Admitting: Surgery

## 2019-06-27 ENCOUNTER — Encounter: Payer: Self-pay | Admitting: Surgery

## 2019-06-27 ENCOUNTER — Ambulatory Visit: Payer: PPO | Attending: Physician Assistant | Admitting: Physical Therapy

## 2019-06-27 ENCOUNTER — Encounter: Payer: Self-pay | Admitting: Physical Therapy

## 2019-06-27 VITALS — BP 133/91 | HR 86 | Temp 97.5°F | Resp 20 | Wt 186.4 lb

## 2019-06-27 DIAGNOSIS — I350 Nonrheumatic aortic (valve) stenosis: Secondary | ICD-10-CM | POA: Diagnosis not present

## 2019-06-27 DIAGNOSIS — R293 Abnormal posture: Secondary | ICD-10-CM | POA: Diagnosis not present

## 2019-06-27 NOTE — Therapy (Signed)
[place], [place], [address] Phone: [phone]   Fax:  [phone]  Physical Therapy Evaluation  Patient Details  Name: Randall Edwards MRN: [id number] Date of Birth: Apr 09, 1946 Referring Provider (PT): Angelena Form PA-C,    Encounter Date: 06/27/2019  PT End of Session - 06/27/19 1300    Visit Number  1    Number of Visits  1    Date for PT Re-Evaluation  06/27/19    PT Start Time  [id number]    PT Stop Time  1300    PT Time Calculation (min)  29 min    Activity Tolerance  Patient tolerated treatment well    Behavior During Therapy  [facility] for tasks assessed/performed       Past Medical History:  Diagnosis Date  . Atrial tachycardia ([employer])    s/p ablation and on sotolol  . GERD (gastroesophageal reflux disease)   . H/O cardiac radiofrequency ablation 08/2016  . Hyperlipidemia   . Hypertension   . S/P AVR (aortic valve replacement) 2010    Past Surgical History:  Procedure Laterality Date  . ABLATION OF DYSRHYTHMIC FOCUS    . CARDIAC VALVE REPLACEMENT    . CHOLECYSTECTOMY    . KIDNEY STONE SURGERY    . KNEE ARTHROSCOPY Bilateral   . MASS EXCISION Right 12/26/2017   Procedure: RIGHT INDEX EXCISION CYST AND DEBRIDEMENT OF DISTAL INTERPHALANGEAL JOINT;  Surgeon: Leanora Cover, MD;  Location: [facility];  Service: Orthopedics;  Laterality: Right;  Bier block  . RIGHT HEART CATH AND CORONARY ANGIOGRAPHY N/A 06/20/2019   Procedure: RIGHT HEART CATH AND CORONARY ANGIOGRAPHY;  Surgeon: Sherren Mocha, MD;  Location: [facility];  Service: Cardiovascular;  Laterality: N/A;    There were no vitals filed for this visit.   Subjective Assessment - 06/27/19 1237    Subjective  pt is a 73 y.o reporting CC as intermittent fatigue with specific onset aroudn 6-7 months ago. patient reports his biggest issue is arthritis in multiple joints which can get up to a 6/10>    Patient Stated Goals   to get heart better    Currently in Pain?  Yes    Pain Score  0-No pain   general overall arthtirist 6/10 in multiple joints   Aggravating Factors   standing/ walking         [facility] PT Assessment - 06/27/19 1233      Assessment   Medical Diagnosis  severe Aortic Stenosis    Referring Provider (PT)  Angelena Form PA-C,     Onset Date/Surgical Date  --   6 months ago   Hand Dominance  Right      Precautions   Precautions  None      Balance Screen   Has the patient fallen in the past 6 months  No    Has the patient had a decrease in activity level because of a fear of falling?   No    Is the patient reluctant to leave their home because of a fear of falling?   No      Home Social worker  Private residence    Living Arrangements  Spouse/significant other    Available Help at Discharge  Family    Type of [place] to enter    Entrance Stairs-Number of Steps  1    Entrance Stairs-Rails  Can reach both  Home Layout  One level    Home Equipment  None      Prior Function   Level of Independence  Independent      ROM / Strength   AROM / PROM / Strength  AROM;Strength      AROM   Overall AROM   Within functional limits for tasks performed      Strength   Overall Strength  Within functional limits for tasks performed    Right Hand Grip (lbs)  60    Left Hand Grip (lbs)  66      Ambulation/Gait   Ambulation/Gait  Yes       [facility] Pre-Surgical Assessment - 06/27/19 0001    5 Meter Walk Test- trial 1  2 sec    5 Meter Walk Test- trial 2  2 sec.     5 Meter Walk Test- trial 3  3 sec.    5 meter walk test average  2.33 sec    4 Stage Balance Test tolerated for:   10 sec.    4 Stage Balance Test Position  4    Sit To Stand Test- trial 1  11 sec.    ADL/IADL Independent with:  Bathing;Dressing;Yard work    ADL/IADL Needs Assistance with:  Meal prep;Finances    ADL/IADL Fraility Index  Vulnerable    6 Minute Walk- Baseline   yes    BP (mmHg)  (!) 158/100    HR (bpm)  72    02 Sat (%RA)  97 %    Modified Borg Scale for Dyspnea  0- Nothing at all    Perceived Rate of Exertion (Borg)  9- very light    6 Minute Walk Post Test  yes    BP (mmHg)  (!) 162/101    HR (bpm)  103    02 Sat (%RA)  94 %    Modified Borg Scale for Dyspnea  0.5- Very, very slight shortness of breath    Perceived Rate of Exertion (Borg)  9- very light    Aerobic Endurance Distance Walked  1347    Endurance additional comments  pt is 22.09% limited compared to age related norm              Objective measurements completed on examination: See above findings.                           Plan - 06/27/19 1307    Clinical Impression Statement  see assessment in note    Stability/Clinical Decision Making  Stable/Uncomplicated    Clinical Decision Making  Low    PT Frequency  One time visit    PT Next Visit Plan  PRE-TAVR evaluation    Consulted and Agree with Plan of Care  Patient       Clinical Impression Statement: Pt is a 73 yo M presenting to OP PT for evaluation prior to possible TAVR surgery due to severe aortic stenosis. Pt reports onset of intermittent fatigue approximately 12 months ago. Symptoms are limiting endurnace. Pt presents with good ROM and strength, good balance and is good  at high fall risk 4 stage balance test, good walking speed and fair aerobic endurance per 6 minute walk test. Pt ambulated 1347 feet without requiring rest break. At the end of the test,patient's HR was 103 bpm and O2 was 94% on room air. Pt reported 0.5/10 shortness of breath on modified scale for dyspnea.Pt ambulated a  total of 1347 feet in 6 minute walk. Mild fatigue increased with 6 minute walk test. Based on the Short Physical Performance Battery, patient has a frailty rating of 12/12 with </= 5/12 considered frail.    Patient demonstrated the following deficits and impairments:     Visit Diagnosis: Abnormal  posture     Problem List Patient Active Problem List   Diagnosis Date Noted  . Severe aortic stenosis 05/16/2019  . Ascending aorta dilatation ([facility]) 05/16/2019  . Osteoarthritis of finger of right hand 01/09/2018  . S/P AVR (aortic valve replacement) 06/03/2017  . Atrial tachycardia ([place]) 07/28/2016  . Dyslipidemia 03/18/2015  . Essential hypertension 03/18/2015  . Status post radiofrequency ablation for arrhythmia 03/18/2015  . Chronic frontal sinusitis 09/12/2014  . Chronic sphenoidal sinusitis 09/12/2014  . Hypertrophy of both inferior nasal turbinates 09/12/2014  . Multiple nasal polyps 09/12/2014  . Nasal septal deviation 09/12/2014   Starr Lake PT, DPT, LAT, ATC  06/27/19  1:15 PM      [facility] [address] [place], [place], [address] Phone: [phone]   Fax:  [phone]  Name: ZEPLIN GIANELLI MRN: [id number] Date of Birth: 15-Apr-1946

## 2019-06-28 ENCOUNTER — Encounter: Payer: Self-pay | Admitting: Surgery

## 2019-06-28 ENCOUNTER — Other Ambulatory Visit (HOSPITAL_COMMUNITY): Payer: Self-pay

## 2019-06-28 NOTE — Progress Notes (Signed)
Patient ID: Randall Edwards, male   DOB: 10-Feb-1946, 73 y.o.   MRN: [id number]   [facility] SURGERY CONSULTATION REPORT  Referring Provider is Revankar, Reita Cliche, MD Primary Cardiologist is No primary care provider on file. PCP is Nicoletta Dress, MD  Chief Complaint  Patient presents with   Aortic Stenosis    TAVR consultation, review studies    HPI:  Patient is a 73 year old gentleman with a history of hypertension, hyperlipidemia, and atrial tachycardia status post ablation who underwent bioprosthetic Bentall procedure by me in 2010 at [facility].  He had a 25 mm Edwards Magna-Ease pericardial valve inside a Gelweave Valsalva graft. The valve model was 3300 TFX.  He has done well since that surgery now presents with a couple month history of exertional fatigue such as doing yard work.  His wife said that he has to rest frequently and is tired at the end of the day.  He denies any shortness of breath or chest pain.  Has had no dizziness or syncope.  He denies any edema.  He has a history of atrial fibrillation and underwent A. fib ablation in 2017.  He continues to take sotalol for rhythm control.  He has had periodic echocardiograms to follow-up on his prosthetic valve.  His most recent echocardiogram 05/14/2011 showed an increase in the mean gradient to 36.5 mmHg with a peak gradient of 60 mm.  The dimensionless index of 0.26.  Left ventricular ejection fraction was 60 to 65%.  His mean aortic valve gradient 1 year ago was 22 mmHg.  The patient is here today with wife.  He is a retired Engineer, structural.  Past Medical History:  Diagnosis Date   Atrial tachycardia [facility])    s/p ablation and on sotolol   GERD (gastroesophageal reflux disease)    H/O cardiac radiofrequency ablation 08/2016   Hyperlipidemia    Hypertension    S/P AVR (aortic valve replacement) 2010    Past Surgical History:  Procedure  Laterality Date   ABLATION OF DYSRHYTHMIC FOCUS     CARDIAC VALVE REPLACEMENT     CHOLECYSTECTOMY     KIDNEY STONE SURGERY     KNEE ARTHROSCOPY Bilateral    MASS EXCISION Right 12/26/2017   Procedure: RIGHT INDEX EXCISION CYST AND DEBRIDEMENT OF DISTAL INTERPHALANGEAL JOINT;  Surgeon: Leanora Cover, MD;  Location: [facility];  Service: Orthopedics;  Laterality: Right;  Bier block   RIGHT HEART CATH AND CORONARY ANGIOGRAPHY N/A 06/20/2019   Procedure: RIGHT HEART CATH AND CORONARY ANGIOGRAPHY;  Surgeon: Sherren Mocha, MD;  Location: [facility];  Service: Cardiovascular;  Laterality: N/A;    Family History  Problem Relation Age of Onset   CAD Brother     Social History   Socioeconomic History   Marital status: Married    Spouse name: Not on file   Number of children: Not on file   Years of education: Not on file   Highest education level: Not on file  Occupational History   Not on file  Social Needs   Financial resource strain: Not on file   Food insecurity    Worry: Not on file    Inability: Not on file   Transportation needs    Medical: Not on file    Non-medical: Not on file  Tobacco Use   Smoking status: Never Smoker   Smokeless tobacco: Never Used  Substance and Sexual Activity  Alcohol use: No    Frequency: Never   Drug use: No   Sexual activity: Not on file  Lifestyle   Physical activity    Days per week: Not on file    Minutes per session: Not on file   Stress: Not on file  Relationships   Social connections    Talks on phone: Not on file    Gets together: Not on file    Attends religious service: Not on file    Active member of club or organization: Not on file    Attends meetings of clubs or organizations: Not on file    Relationship status: Not on file   Intimate partner violence    Fear of current or ex partner: Not on file    Emotionally abused: Not on file    Physically abused: Not on file     Forced sexual activity: Not on file  Other Topics Concern   Not on file  Social History Narrative   Not on file    Current Outpatient Medications  Medication Sig Dispense Refill   aspirin EC 81 MG tablet Take 81 mg by mouth daily.      fexofenadine (ALLEGRA) 180 MG tablet Take 180 mg by mouth 2 (two) times daily.      rosuvastatin (CRESTOR) 10 MG tablet TAKE ONE TABLET BY MOUTH EVERY DAY IN THE EVENING (Patient taking differently: Take 10 mg by mouth every evening. ) 90 tablet 3   sotalol (BETAPACE) 80 MG tablet Take 40 mg by mouth 2 (two) times daily.     temazepam (RESTORIL) 15 MG capsule Take 15 mg by mouth at bedtime.      triamterene-hydrochlorothiazide (MAXZIDE-25) 37.5-25 MG tablet TAKE ONE TABLET BY MOUTH EVERY DAY (Patient taking differently: Take 1 tablet by mouth daily. ) 90 tablet 1   diazepam (VALIUM) 5 MG tablet Take 5mg  (one tablet) 1 hour prior to CT scan. You may take the second 5mg  tablet (for a total of 10mg ) if necessary. (Patient not taking: Reported on 06/27/2019) 2 tablet 0   omeprazole (PRILOSEC) 20 MG capsule Take 20 mg by mouth daily.      sodium chloride (OCEAN) 0.65 % SOLN nasal spray Place 1 spray into both nostrils 3 (three) times daily as needed for congestion.     tamsulosin (FLOMAX) 0.4 MG CAPS capsule Take 0.4 mg by mouth daily after supper.      No current facility-administered medications for this visit.     No Known Allergies    Review of Systems:   General:  normal appetite, + decreased energy, no weight gain, no weight loss, no fever  Cardiac:  no chest pain with exertion, no chest pain at rest, no SOB with  exertion, no resting SOB, no PND, no orthopnea, no palpitations, + arrhythmia, + atrial fibrillation, no LE edema, no dizzy spells, no syncope  Respiratory:  no shortness of breath, no home oxygen, no productive cough, no dry cough, no bronchitis, no wheezing, no hemoptysis, no asthma, no pain with inspiration or cough, no sleep  apnea, no CPAP at night  GI:   no difficulty swallowing, no reflux, no frequent heartburn, no hiatal hernia, no abdominal pain, no constipation, no diarrhea, no hematochezia, no hematemesis, no melena  GU:   no dysuria,  no frequency, no urinary tract infection, no hematuria, no enlarged prostate, no kidney stones, no kidney disease  Vascular:  no pain suggestive of claudication, no pain in feet, no leg cramps, no  varicose veins, no DVT, no non-healing foot ulcer  Neuro:   no stroke, no TIA's, no seizures, no headaches, no temporary blindness one eye,  no slurred speech, no peripheral neuropathy, no chronic pain, o instability of gait, no memory/cognitive dysfunction  Musculoskeletal: + arthritis, no joint swelling, no myalgias, no difficulty walking, normal mobility   Skin:   no rash, no itching, no skin infections, no pressure sores or ulcerations  Psych:   no anxiety, no depression, no nervousness, no unusual recent stress  Eyes:   no blurry vision, no floaters, no recent vision changes, + wears glasses or contacts  ENT:   no hearing loss, no loose or painful teeth, no dentures, last saw dentist every 6 months.  Hematologic:  no easy bruising, no abnormal bleeding, no clotting disorder, no frequent epistaxis  Endocrine:  no diabetes, does not check CBG's at home        Physical Exam:   BP (!) 133/91 (BP Location: Right Arm)    Pulse 86    Temp (!) 97.5 F (36.4 C) (Skin) Comment: RA   Resp 20    Wt 186 lb 6.4 oz (84.6 kg)    SpO2 94% Comment: RA   BMI 27.53 kg/m   General:  well-appearing  HEENT:  Unremarkable, NCAT, PERLA, EOMI, teeth in good condition  Neck:   no JVD, no bruits, no adenopathy   Chest:   clear to auscultation, symmetrical breath sounds, no wheezes, no rhonchi   CV:   RRR, grade lll/VI crescendo/decrescendo murmur heard best at RSB,  no diastolic murmur  Abdomen:  soft, non-tender, no masses   Extremities:  warm, well-perfused, pulses palpable in feet, no LE  edema  Rectal/GU  Deferred  Neuro:   Grossly non-focal and symmetrical throughout  Skin:   Clean and dry, no rashes, no breakdown   Diagnostic Tests:  ECHOCARDIOGRAM REPORT       Patient Name:   RHYNE ALWAY Date of Exam: 05/14/2019 Medical Rec #:  [id number]      Height:       68.5 in Accession #:    [id number]     Weight:       189.0 lb Date of Birth:  29-Jul-1946      BSA:          2.01 m Patient Age:    76 years       BP:           142/76 mmHg Patient Gender: M              HR:           62 bpm. Exam Location:  [facility]    Procedure: 2D Echo  Indications:    S/P AVR (aortic valve replacement) [Z95.2] - Primary   History:        Patient has prior history of Echocardiogram examinations, most                 recent 07/18/2018. Atrial Fibrillation Risk Factors: Hypertension                 and Dyslipidemia.   Sonographer:    Luane School Referring Phys: [place]    1. The left ventricle has normal systolic function with an ejection fraction of 60-65%. The cavity size was normal. There is moderately increased left ventricular wall thickness. Left ventricular diastolic Doppler parameters are consistent with impaired  relaxation.  2. The right ventricle has  normal systolic function. The cavity was normal. There is no increase in right ventricular wall thickness.  3. Left atrial size was mild-moderately dilated.  4. The mitral valve is grossly normal.  5. The tricuspid valve is grossly normal.  6. Aortic valve regurgitation was not assessed by color flow Doppler. Severe stenosis of the aortic valve.  7. The patient has a bioprosthetic valve.  8. Aneurysm of the ascending aorta, measuring 46 mm.  9. The aorta is abnormal unless otherwise noted.  FINDINGS  Left Ventricle: The left ventricle has normal systolic function, with an ejection fraction of 60-65%. The cavity size was normal. There is moderately increased left ventricular wall thickness.  Left ventricular diastolic Doppler parameters are consistent  with impaired relaxation.  Right Ventricle: The right ventricle has normal systolic function. The cavity was normal. There is no increase in right ventricular wall thickness.  Left Atrium: Left atrial size was mild-moderately dilated.  Right Atrium: Right atrial size was normal in size. Right atrial pressure is estimated at 3 mmHg.  Interatrial Septum: No atrial level shunt detected by color flow Doppler.  Pericardium: There is no evidence of pericardial effusion.  Mitral Valve: The mitral valve is grossly normal. Mitral valve regurgitation was not assessed by color flow Doppler.  Tricuspid Valve: The tricuspid valve is grossly normal. Tricuspid valve regurgitation is trivial by color flow Doppler.  Aortic Valve: The aortic valve has been repaired/replaced Aortic valve regurgitation was not assessed by color flow Doppler. There is Severe stenosis of the aortic valve, with a calculated valve area of 1.00 cm. The patient has a bioprosthetic valve.    Pulmonic Valve: The pulmonic valve was not well visualized. Pulmonic valve regurgitation was not assessed by color flow Doppler.  Aorta: The aorta is abnormal unless otherwise noted. There is an aneurysm involving the ascending aorta. The aneurysm measures 46 mm.  Venous: The inferior vena cava is normal in size with greater than 50% respiratory variability.    +--------------+--------++  LEFT VENTRICLE            +----------------+---------++ +--------------+--------++  Diastology                    PLAX 2D                   +----------------+---------++ +--------------+--------++  LV e' lateral:   6.09 cm/s    LVIDd:         4.20 cm    +----------------+---------++ +--------------+--------++  LV E/e' lateral: 12.3         LVIDs:         2.90 cm    +----------------+---------++ +--------------+--------++  LV e' medial:    4.57 cm/s    LV PW:         1.40 cm     +----------------+---------++ +--------------+--------++  LV E/e' medial:  16.5         LV IVS:        1.50 cm    +----------------+---------++ +--------------+--------++  LVOT diam:     2.20 cm    +--------------+--------++  LV SV:         46 ml      +--------------+--------++  LV SV Index:   22.56      +--------------+--------++  LVOT Area:     3.80 cm   +--------------+--------++                            +--------------+--------++  +---------------+---------++  RIGHT VENTRICLE             +---------------+---------++  RV S prime:     8.05 cm/s   +---------------+---------++  TAPSE (M-mode): 1.6 cm      +---------------+---------++  +---------------+-------++-----------++  LEFT ATRIUM              Index         +---------------+-------++-----------++  LA diam:        4.50 cm  2.24 cm/m    +---------------+-------++-----------++  LA Vol (A2C):   61.5 ml  30.66 ml/m   +---------------+-------++-----------++  LA Vol (A4C):   90.6 ml  45.16 ml/m   +---------------+-------++-----------++  LA Biplane Vol: 76.3 ml  38.03 ml/m   +---------------+-------++-----------++ +------------+---------++-----------++  RIGHT ATRIUM            Index         +------------+---------++-----------++  RA Area:     17.50 cm                +------------+---------++-----------++  RA Volume:   38.50 ml   19.19 ml/m   +------------+---------++-----------++  +------------------+------------++  AORTIC VALVE                      +------------------+------------++  AV Area (Vmax):    0.89 cm       +------------------+------------++  AV Area (Vmean):   0.93 cm       +------------------+------------++  AV Area (VTI):     1.00 cm       +------------------+------------++  AV Vmax:           386.50 cm/s    +------------------+------------++  AV Vmean:          284.500 cm/s   +------------------+------------++  AV VTI:            0.866 m        +------------------+------------++  AV  Peak Grad:      59.8 mmHg      +------------------+------------++  AV Mean Grad:      36.5 mmHg      +------------------+------------++  LVOT Vmax:         90.90 cm/s     +------------------+------------++  LVOT Vmean:        69.600 cm/s    +------------------+------------++  LVOT VTI:          0.228 m        +------------------+------------++  LVOT/AV VTI ratio: 0.26           +------------------+------------++   +-------------+-------++  AORTA                   +-------------+-------++  Ao Root diam: 4.20 cm   +-------------+-------++  Ao Asc diam:  4.70 cm   +-------------+-------++  +--------------+----------++  +---------------+-----------++  MITRAL VALVE                  TRICUSPID VALVE               +--------------+----------++  +---------------+-----------++  MV Area (PHT): 2.26 cm       TR Peak grad:   9.4 mmHg      +--------------+----------++  +---------------+-----------++  MV PHT:        97.15 msec     TR Vmax:        153.00 cm/s   +--------------+----------++  +---------------+-----------++  MV Decel Time: 335 msec     +--------------+----------++  +--------------+-------+ +--------------+-----------++  SHUNTS  MV E velocity: 75.20 cm/s    +--------------+-------+ +--------------+-----------++  Systemic VTI:  0.23 m    MV A velocity: 108.00 cm/s   +--------------+-------+ +--------------+-----------++  Systemic Diam: 2.20 cm   MV E/A ratio:  0.70          +--------------+-------+ +--------------+-----------++    Jyl Heinz MD Electronically signed by Jyl Heinz MD Signature Date/Time: 05/14/2019/3:13:21 PM    Physicians  Panel Physicians Referring Physician Case Authorizing Physician  Sherren Mocha, MD (Primary)    Procedures  RIGHT HEART CATH AND CORONARY ANGIOGRAPHY  Conclusion  1. Widely patent coronary arteries without stenosis 2. Normal right heart hemodynamics 3. Known severe bioprosthetic aortic valve stenosis by  echo assessment  Plan: continue multidisciplinary heart team evaluation for treatment of severe bioprosthetic aortic stenosis.   Indications  Severe aortic stenosis [I35.0 (ICD-10-CM)]  Procedural Details  Technical Details INDICATION: Severe bioprosthetic aortic valve stenosis  PROCEDURAL DETAILS: There was an indwelling IV in a right antecubital vein. Using normal sterile technique, the IV was changed out for a 5 Fr brachial sheath over a 0.018 inch wire. The right wrist was then prepped, draped, and anesthetized with 1% lidocaine. Using the modified Seldinger technique a 5/6 French Slender sheath was placed in the right radial artery. Intra-arterial verapamil was administered through the radial artery sheath. IV heparin was administered after a JR4 catheter was advanced into the central aorta. A Swan-Ganz catheter was used for the right heart catheterization. Standard protocol was followed for recording of right heart pressures and sampling of oxygen saturations. Fick cardiac output was calculated. Standard Judkins catheters were used for selective coronary angiography. The aortic valve was difficult to cross from radial access. The valve was crossed with an AL-1 catheter directing a straight wire, but the AL-1 would not advance into the left ventricle. I then tried to cross the valve with a MPA catheter but this was unsuccessful. Catheters, wires, and sheaths were removed without complication.  There were no immediate procedural complications. The patient was transferred to the post catheterization recovery area for further monitoring.    Estimated blood loss <50 mL.   During this procedure medications were administered to achieve and maintain moderate conscious sedation while the patient's heart rate, blood pressure, and oxygen saturation were continuously monitored and I was present face-to-face 100% of this time.  Medications (Filter: Administrations occurring from 06/20/19 1322 to 06/20/19  1425) (important)  Continuous medications are totaled by the amount administered until 06/20/19 1425.  Medication Rate/Dose/Volume Action  Date Time   Heparin (Porcine) in NaCl 1000-0.9 UT/500ML-% SOLN (mL) 500 mL Given 06/20/19 1341   Total dose as of 06/20/19 1425 500 mL Given 1341   1,000 mL        fentaNYL (SUBLIMAZE) injection (mcg) 25 mcg Given 06/20/19 1346   Total dose as of 06/20/19 1425        25 mcg        midazolam (VERSED) injection (mg) 1 mg Given 06/20/19 1346   Total dose as of 06/20/19 1425        1 mg        lidocaine (PF) (XYLOCAINE) 1 % injection (mL) 2 mL Given 06/20/19 1346   Total dose as of 06/20/19 1425 2 mL Given 1347   4 mL        Radial Cocktail/Verapamil only (mL) 10 mL Given 06/20/19 1347   Total dose as of 06/20/19 1425        10 mL  heparin injection (Units) 4,000 Units Given 06/20/19 1355   Total dose as of 06/20/19 1425        4,000 Units        iohexol (OMNIPAQUE) 350 MG/ML injection (mL) 60 mL Given 06/20/19 1415   Total dose as of 06/20/19 1425        60 mL        Sedation Time  Sedation Time Physician-1: 26 minutes 14 seconds  Contrast  Medication Name Total Dose  iohexol (OMNIPAQUE) 350 MG/ML injection 60 mL    Radiation/Fluoro  Fluoro time: 10.3 (min) DAP: 20242 (mGycm2) Cumulative Air Kerma: 374 (mGy)  Coronary Findings  Diagnostic Dominance: Right Left Main  Vessel is angiographically normal.  Left Anterior Descending  The vessel exhibits minimal luminal irregularities.  Ramus Intermedius  Vessel is moderate in size. Vessel is angiographically normal.  Left Circumflex  Vessel is angiographically normal.  Right Coronary Artery  Vessel is moderate in size. Vessel is angiographically normal.  Right Ventricular Branch  Widely patent, dominant RCA with no stenosis  Intervention  No interventions have been documented. Coronary Diagrams  Diagnostic Dominance: Right  Intervention  Implants   No implant  documentation for this case.  Syngo Images  Show images for CARDIAC CATHETERIZATION  Images on Long Term Storage  Show images for Randall Edwards, Randall Edwards to Procedure Log  Procedure Log    Hemo Data   Most Recent Value  Fick Cardiac Output 5.06 L/min  Fick Cardiac Output Index 2.53 (L/min)/BSA  RA A Wave 6 mmHg  RA V Wave 5 mmHg  RA Mean 3 mmHg  RV Systolic Pressure 24 mmHg  RV Diastolic Pressure 0 mmHg  RV EDP 6 mmHg  PA Systolic Pressure 23 mmHg  PA Diastolic Pressure 0 mmHg  PA Mean 12 mmHg  PW A Wave 11 mmHg  PW V Wave 12 mmHg  PW Mean 8 mmHg  AO Systolic Pressure [id number] mmHg  AO Diastolic Pressure 72 mmHg  AO Mean 97 mmHg  QP/QS 1  TPVR Index 4.74 HRUI  TSVR Index 38.33 HRUI  PVR SVR Ratio 0.04  TPVR/TSVR Ratio 0.12    ADDENDUM REPORT: 06/12/2019 11:26  CLINICAL DATA:  Aortic stenosis  EXAM: Cardiac TAVR CT  TECHNIQUE: The patient was scanned on a Siemens Force [id number] slice scanner. A 120 kV retrospective scan was triggered in the descending thoracic aorta at 111 HU's. Gantry rotation speed was 270 msecs and collimation was .9 mm. No beta blockade or nitro were given. The 3D data set was reconstructed in 5% intervals of the R-R cycle. Systolic and diastolic phases were analyzed on a dedicated work station using MPR, MIP and VRT modes. The patient received 80 cc of contrast.  FINDINGS: Aortic Valve: Patient has had a Bentall procedure with AVR and root replacement Stented pericardial tissue valve 25 mm Edwards MagnaEase. Thickened leaflets with restricted motion. Annulus intact ID measures 24 mm consistent with described valve replacement.  Aorta: Dilated and angulated ascending root. Suture line noted for Bentall bovine arch with moderate calcific atherosclerosis  Ascending Thoracic Aorta: 3.9 cm  Aortic Arch: 3.1 cm  Descending Thoracic Aorta: 2.9 cm  Sinus of Valsalva Measurements:  Non-coronary: 38.6 mm  Right - coronary: 38.2  mm  Left - coronary: 38 mm  Coronary Artery Height above Annulus:  Left Main: 19.9 mm above annulus and above valve strut. SA 11 mm away from virtual valve at ostium  Right Coronary: 16.1 mm above annulus and  near top edge of valve strut SA 12.6 mm away from virtual valve ostium  IMPRESSION: 1. 25 mm Edwards stented pericardial tissue valve with ID 24 mm MagnaEase. Thickened leaflets with restricted motion. Intact annulus  2. Angulated and dilated aortic root 3.9 cm Appears to have had Bentall with root replacement Bovine Arch  3. Coronary arteries appear to have sufficient height above valve struts and SA depth to avoid occlusion with valve in valve procedure  4.  Appropriate for 26 mm Sapien 3 valve in valve TAVR  Jenkins Rouge   Electronically Signed   By: Jenkins Rouge M.D.   On: 06/12/2019 11:26   Addended by Josue Hector, MD on 06/12/2019 11:29 AM    Study Result  EXAM: OVER-READ INTERPRETATION  CT CHEST  The following report is an over-read performed by radiologist Dr. Vinnie Langton of [facility], [place] on 06/12/2019. This over-read does not include interpretation of cardiac or coronary anatomy or pathology. The coronary calcium score/coronary CTA interpretation by the cardiologist is attached.  COMPARISON:  Chest CTA 07/24/2018.  FINDINGS: Extracardiac findings will be described separately under dictation for contemporaneously obtained CTA chest, abdomen and pelvis.  IMPRESSION: Please see separate dictation for contemporaneously obtained CTA chest, abdomen and pelvis dated 06/12/2019 for full description of relevant extracardiac findings.  Electronically Signed: By: Vinnie Langton M.D. On: 06/12/2019 10:28      CLINICAL DATA:  73 year old male with history of severe aortic stenosis. Preprocedural study prior to potential transcatheter aortic valve replacement (TAVR) procedure.  EXAM: CT ANGIOGRAPHY CHEST, ABDOMEN  AND PELVIS  TECHNIQUE: Multidetector CT imaging through the chest, abdomen and pelvis was performed using the standard protocol during bolus administration of intravenous contrast. Multiplanar reconstructed images and MIPs were obtained and reviewed to evaluate the vascular anatomy.  CONTRAST:  145mL OMNIPAQUE IOHEXOL 350 MG/ML SOLN  COMPARISON:  CTA chest 07/24/2018.  FINDINGS: CTA CHEST FINDINGS  Cardiovascular: Heart size is normal. There is no significant pericardial fluid, thickening or pericardial calcification. There is aortic atherosclerosis, as well as atherosclerosis of the great vessels of the mediastinum and the coronary arteries, including calcified atherosclerotic plaque in the left main and left anterior descending coronary arteries. Status post median sternotomy for aortic root replacement with a stented bioprosthesis.  Mediastinum/Lymph Nodes: No pathologically enlarged mediastinal or hilar lymph nodes. Esophagus is unremarkable in appearance. No axillary lymphadenopathy.  Lungs/Pleura: No suspicious appearing pulmonary nodules or masses are noted. No acute consolidative airspace disease. No pleural effusions.  Musculoskeletal/Soft Tissues: Median sternotomy wires. There are no aggressive appearing lytic or blastic lesions noted in the visualized portions of the skeleton.  CTA ABDOMEN AND PELVIS FINDINGS  Hepatobiliary: No suspicious cystic or solid hepatic lesions. No intra or extrahepatic biliary ductal dilatation. Status post cholecystectomy.  Pancreas: No pancreatic mass. No pancreatic ductal dilatation. No pancreatic or peripancreatic fluid collections or inflammatory changes.  Spleen: Unremarkable.  Adrenals/Urinary Tract: Bilateral kidneys and bilateral adrenal glands are normal in appearance. No hydroureteronephrosis. Urinary bladder is normal in appearance.  Stomach/Bowel: Normal appearance of the stomach. No  pathologic dilatation of small bowel or colon. Numerous colonic diverticulae are noted, without surrounding inflammatory changes to suggest an acute diverticulitis at this time. Normal appendix.  Vascular/Lymphatic: Aortic atherosclerosis, without evidence of aneurysm or dissection in the abdominal or pelvic vasculature. Vascular findings and measurements pertinent to potential TAVR procedure, as detailed below. No lymphadenopathy noted in the abdomen or pelvis.  Reproductive: Prostate gland and seminal vesicles are unremarkable in appearance.  Other: No significant volume of ascites.  No pneumoperitoneum.  Musculoskeletal: There are no aggressive appearing lytic or blastic lesions noted in the visualized portions of the skeleton.  VASCULAR MEASUREMENTS PERTINENT TO TAVR:  AORTA:  Minimal Aortic Diameter-17 x 18 mm  Severity of Aortic Calcification-mild  RIGHT PELVIS:  Right Common Iliac Artery -  Minimal Diameter-13.2 x 10.9 mm  Tortuosity-mild  Calcification-mild  Right External Iliac Artery -  Minimal Diameter-11.1 x 11.5 mm  Tortuosity-moderate  Calcification-minimal  Right Common Femoral Artery -  Minimal Diameter-12.1 x 10.5 mm  Tortuosity-mild  Calcification-mild  LEFT PELVIS:  Left Common Iliac Artery -  Minimal Diameter-12.4 x 10.3 mm  Tortuosity-mild  Calcification-mild  Left External Iliac Artery -  Minimal Diameter-12.4 x 12.0 mm  Tortuosity-moderate  Calcification-none  Left Common Femoral Artery -  Minimal Diameter-13.4 x 12.2 mm  Tortuosity-mild  Calcification-mild  Review of the MIP images confirms the above findings.  IMPRESSION: 1. Vascular findings and measurements pertinent to potential TAVR procedure, as detailed above. 2. Aortic atherosclerosis, in addition to left main and left anterior descending coronary artery disease. 3. Status post aortic valve replacement with stented  bioprosthesis. 4. Colonic diverticulosis without evidence of acute diverticulitis at this time. 5. Additional incidental findings, as above.   Electronically Signed   By: Vinnie Langton M.D.   On: 06/12/2019 11:08   STS RISK CALCULATOR: Redo aortic valve replacement: Risk of Mortality: 1.129% Renal Failure: 1.326% Permanent Stroke: 1.556% Prolonged Ventilation: 4.238% DSW Infection: 0.106% Reoperation: 3.328% Morbidity or Mortality: 7.595% Short Length of Stay: 49.567% Long Length of Stay: 3.426%  Impression:  This 73 year old gentleman has stage D, severe, symptomatic prosthetic aortic stenosis New York Heart Association class II symptoms to moderate physical activity consistent with chronic diastolic congestive heart failure.  Says that he feels fairly well overall and was 145 units very tired at the end of the day and has to stop frequently when doing physical activity.  His aerobic physical activity is somewhat limited by his arthritis although he continues to be active doing things around the house and yard.  I have personally reviewed his 2D echocardiogram, cardiac catheterization, and CTA studies.  He has a 25 mm pericardial aortic valve prosthesis that is thickened with reduced leaflet mobility.  The mean gradient has increased from 22 mmHg 1 year ago to 36.5 mmHg now.  Left ventricular systolic function is normal.  Cardiac catheterization shows no coronary disease.  I agree that it is probably time to proceed with aortic valve replacement given the degree of progression of the past year.  I think he will probably become progressively more symptomatic over the next 6 months if this is not treated.  I think the operative risk for open surgical replacement morbidity significantly higher than that predicted by the STS risk calculator since he had a composite bioprosthesis/graft Bentall procedure.  This would require redo root replacement.  I think valve in valve TAVR would  be the best option for treating this patient.  Is 25 mm Edwards Magna-Ease pericardial valve can be replaced with 26 mm Sapien 3 valve.  His gated cardiac CTA shows anatomy suitable for valve in valve TAVR.  His abdominal and pelvic CTA shows adequate pelvic vascular anatomy to allow transfemoral insertion.  The patient and his wife were counseled at length regarding treatment alternatives for management of severe symptomatic aortic stenosis. The risks and benefits of surgical intervention has been discussed in detail. Long-term prognosis with medical therapy was discussed. Alternative  approaches such as conventional surgical aortic valve replacement, transcatheter aortic valve replacement, and palliative medical therapy were compared and contrasted at length. This discussion was placed in the context of the patient's own specific clinical presentation and past medical history. All of their questions have been addressed.    Following the decision to proceed with transcatheter aortic valve replacement, a discussion was held regarding what types of management strategies would be attempted intraoperatively in the event of life-threatening complications, including whether or not the patient would be considered a candidate for the use of cardiopulmonary bypass and/or conversion to open sternotomy for attempted surgical intervention.  I do not think he would be a candidate for redo sternotomy to manage any intraoperative complications.  The patient has been advised of a variety of complications that might develop including but not limited to risks of death, stroke, paravalvular leak, aortic dissection or other major vascular complications, aortic annulus rupture, device embolization, cardiac rupture or perforation, mitral regurgitation, acute myocardial infarction, arrhythmia, heart block or bradycardia requiring permanent pacemaker placement, congestive heart failure, respiratory failure, renal failure, pneumonia,  infection, other late complications related to structural valve deterioration or migration, or other complications that might ultimately cause a temporary or permanent loss of functional independence or other long term morbidity. The patient provides full informed consent for the procedure as described and all questions were answered.     Plan:  He will be scheduled for transfemoral valve in valve TAVR using a Sapien 3 valve on Tuesday, 07/03/2019.   I spent 60 minutes performing this consultation and > 50% of this time was spent face to face counseling and coordinating the care of this patient's severe prosthetic aortic valve stenosis.    Gaye Pollack, MD 06/27/2019

## 2019-06-29 ENCOUNTER — Ambulatory Visit (HOSPITAL_COMMUNITY)
Admission: RE | Admit: 2019-06-29 | Discharge: 2019-06-29 | Disposition: A | Discharge status: Home / Self Care | Payer: PPO | Source: Ambulatory Visit | Attending: Cardiovascular Disease | Admitting: Cardiovascular Disease

## 2019-06-29 ENCOUNTER — Other Ambulatory Visit: Payer: Self-pay

## 2019-06-29 ENCOUNTER — Other Ambulatory Visit (HOSPITAL_COMMUNITY): Payer: PPO

## 2019-06-29 ENCOUNTER — Encounter (HOSPITAL_COMMUNITY): Payer: Self-pay

## 2019-06-29 ENCOUNTER — Other Ambulatory Visit (HOSPITAL_COMMUNITY)
Admit: 2019-06-29 | Discharge: 2019-06-29 | Disposition: A | Discharge status: Home / Self Care | Payer: PPO | Attending: Cardiovascular Disease | Admitting: Cardiovascular Disease

## 2019-06-29 ENCOUNTER — Other Ambulatory Visit: Payer: Self-pay | Admitting: Physician Assistant

## 2019-06-29 ENCOUNTER — Telehealth: Payer: Self-pay | Admitting: Physician Assistant

## 2019-06-29 ENCOUNTER — Encounter (HOSPITAL_COMMUNITY)
Admit: 2019-06-29 | Discharge: 2019-06-29 | Disposition: A | Discharge status: Home / Self Care | Payer: PPO | Attending: Cardiovascular Disease | Admitting: Cardiovascular Disease

## 2019-06-29 DIAGNOSIS — Z20828 Contact with and (suspected) exposure to other viral communicable diseases: Secondary | ICD-10-CM | POA: Diagnosis not present

## 2019-06-29 DIAGNOSIS — I350 Nonrheumatic aortic (valve) stenosis: Secondary | ICD-10-CM

## 2019-06-29 DIAGNOSIS — Z01818 Encounter for other preprocedural examination: Secondary | ICD-10-CM | POA: Insufficient documentation

## 2019-06-29 DIAGNOSIS — I700 Atherosclerosis of aorta: Secondary | ICD-10-CM | POA: Diagnosis not present

## 2019-06-29 HISTORY — DX: Dizziness and giddiness: R42

## 2019-06-29 HISTORY — DX: Benign prostatic hyperplasia without lower urinary tract symptoms: N40.0

## 2019-06-29 LAB — PROTIME-INR
INR: 1 (ref 0.8–1.2)
Prothrombin Time: 13.4 seconds (ref 11.4–15.2)

## 2019-06-29 LAB — HEMOGLOBIN A1C
Hgb A1c MFr Bld: 5.3 % (ref 4.8–5.6)
Mean Plasma Glucose: 105.41 mg/dL

## 2019-06-29 LAB — CBC
HCT: 49.5 % (ref 39.0–52.0)
Hemoglobin: 16.9 g/dL (ref 13.0–17.0)
MCH: 32.7 pg (ref 26.0–34.0)
MCHC: 34.1 g/dL (ref 30.0–36.0)
MCV: 95.7 fL (ref 80.0–100.0)
Platelets: 161 10*3/uL (ref 150–400)
RBC: 5.17 MIL/uL (ref 4.22–5.81)
RDW: 12.3 % (ref 11.5–15.5)
WBC: 7.9 10*3/uL (ref 4.0–10.5)
nRBC: 0 % (ref 0.0–0.2)

## 2019-06-29 LAB — COMPREHENSIVE METABOLIC PANEL
ALT: 21 U/L (ref 0–44)
AST: 25 U/L (ref 15–41)
Albumin: 4.1 g/dL (ref 3.5–5.0)
Alkaline Phosphatase: 67 U/L (ref 38–126)
Anion gap: 11 (ref 5–15)
BUN: 13 mg/dL (ref 8–23)
CO2: 21 mmol/L — ABNORMAL LOW (ref 22–32)
Calcium: 9.4 mg/dL (ref 8.9–10.3)
Chloride: 108 mmol/L (ref 98–111)
Creatinine, Ser: 0.86 mg/dL (ref 0.61–1.24)
GFR calc Af Amer: >60 mL/min (ref >60)
GFR calc non Af Amer: >60 mL/min (ref >60)
Glucose, Bld: 110 mg/dL — ABNORMAL HIGH (ref 70–99)
Potassium: 3.3 mmol/L — ABNORMAL LOW (ref 3.5–5.1)
Sodium: 140 mmol/L (ref 135–145)
Total Bilirubin: 1.7 mg/dL — ABNORMAL HIGH (ref 0.3–1.2)
Total Protein: 6.6 g/dL (ref 6.5–8.1)

## 2019-06-29 LAB — BLOOD GAS, ARTERIAL
Acid-Base Excess: 1.8 mmol/L (ref 0.0–2.0)
Bicarbonate: 25.3 mmol/L (ref 20.0–28.0)
Drawn by: 421801
FIO2: 0.21
O2 Saturation: 97.7 %
Patient temperature: 98.6
pCO2 arterial: 35.9 mmHg (ref 32.0–48.0)
pH, Arterial: 7.462 — ABNORMAL HIGH (ref 7.350–7.450)
pO2, Arterial: 94 mmHg (ref 83.0–108.0)

## 2019-06-29 LAB — URINALYSIS, ROUTINE W REFLEX MICROSCOPIC
Bilirubin Urine: NEGATIVE
Glucose, UA: NEGATIVE mg/dL
Hgb urine dipstick: NEGATIVE
Ketones, ur: NEGATIVE mg/dL
Leukocytes,Ua: NEGATIVE
Nitrite: NEGATIVE
Protein, ur: NEGATIVE mg/dL
Specific Gravity, Urine: 1.013 (ref 1.005–1.030)
pH: 6 (ref 5.0–8.0)

## 2019-06-29 LAB — SURGICAL PCR SCREEN
MRSA, PCR: NEGATIVE
Staphylococcus aureus: NEGATIVE

## 2019-06-29 LAB — BRAIN NATRIURETIC PEPTIDE: B Natriuretic Peptide: 126.9 pg/mL — ABNORMAL HIGH (ref 0.0–100.0)

## 2019-06-29 LAB — ABO/RH: ABO/RH(D): A POS

## 2019-06-29 LAB — APTT: aPTT: 27 seconds (ref 24–36)

## 2019-06-29 MED ORDER — POTASSIUM CHLORIDE ER 10 MEQ PO TBCR: 10.0000 meq | EXTENDED_RELEASE_TABLET | Freq: Every day | ORAL | 6 refills | Status: DC

## 2019-06-29 NOTE — Progress Notes (Signed)
[place], [place] [address] Phone: [phone] Fax: [phone]      Your procedure is scheduled on Tuesday 07/03/2019.  Report to [facility] Main Entrance "A" at 0530 A.M., and check in at the Admitting office.  Call this number if you have problems the morning of surgery:  [phone]  Call [phone] if you have any questions prior to your surgery date Monday-Friday 8am-4pm    Remember:  Do not eat or drink after midnight the night before your surgery.    Continue all medications as prescribed up to the day of surgery.   Do not take any medications the morning of surgery.     The Morning of Surgery  Do not wear jewelry.  Do not wear lotions, powders, colognes, or deodorant  Men may shave face and neck.  Do not bring valuables to the hospital.  [facility] is not responsible for any belongings or valuables.  If you are a smoker, DO NOT Smoke 24 hours prior to surgery  If you wear a CPAP at night please bring your mask, tubing, and machine the morning of surgery   Contacts, eyeglasses, hearing aids, dentures or bridgework may not be worn into surgery.   Leave your suitcase in the car.  After surgery it may be brought to your room.  For patients admitted to the hospital, discharge time will be determined by your treatment team.  Patients discharged the day of surgery will not be allowed to drive home.    Special instructions:   [facility]- Preparing For Surgery  Before surgery, you can play an important role. Because skin is not sterile, your skin needs to be as free of germs as possible. You can reduce the number of germs on your skin by washing with CHG (chlorahexidine gluconate) Soap before surgery.  CHG is an antiseptic cleaner which kills germs and bonds with the skin to continue killing germs even after washing.    Oral Hygiene is also important to reduce your risk of infection.  Remember - BRUSH YOUR  TEETH THE MORNING OF SURGERY WITH YOUR REGULAR TOOTHPASTE  Please do not use if you have an allergy to CHG or antibacterial soaps. If your skin becomes reddened/irritated stop using the CHG.  Do not shave (including legs and underarms) for at least 48 hours prior to first CHG shower. It is OK to shave your face.  Please follow these instructions carefully.   1. Shower the NIGHT BEFORE SURGERY and the MORNING OF SURGERY with CHG Soap.   2. If you chose to wash your hair, wash your hair first as usual with your normal shampoo.  3. After you shampoo, rinse your hair and body thoroughly to remove the shampoo.  4. Use CHG as you would any other liquid soap. You can apply CHG directly to the skin and wash gently with a scrungie or a clean washcloth.   5. Apply the CHG Soap to your body ONLY FROM THE NECK DOWN.  Do not use on open wounds or open sores. Avoid contact with your eyes, ears, mouth and genitals (private parts). Wash Face and genitals (private parts)  with your normal soap.   6. Wash thoroughly, paying special attention to the area where your surgery will be performed.  7. Thoroughly rinse your body with warm water from the neck down.  8. DO NOT shower/wash with your normal soap after using and rinsing off the CHG  Soap.  9. Pat yourself dry with a CLEAN TOWEL.  10. Wear CLEAN PAJAMAS to bed the night before surgery, wear comfortable clothes the morning of surgery  11. Place CLEAN SHEETS on your bed the night of your first shower and DO NOT SLEEP WITH PETS.    Day of Surgery:   Please shower the morning of surgery with the CHG soap  Do not apply any deodorants/lotions. Please wear clean clothes to the hospital/surgery center.   Remember to brush your teeth WITH YOUR REGULAR TOOTHPASTE.   Please read over the following fact sheets that you were given.

## 2019-06-29 NOTE — Telephone Encounter (Signed)
  [facility] VALVE TEAM  Patient called about low potassium of 3.3. Will start him on a low dose potassium supplement now. Kdur 10 meq daily has been called into his pharmacy   Angelena Form PA-C  [facility]

## 2019-06-29 NOTE — Progress Notes (Signed)
PCP - Dr. Delena Bali Cardiologist - Dr. Geraldo Pitter  PPM/ICD - n/a Device Orders -  Rep Notified -   Chest x-ray - 06/29/2019 EKG - 06/20/2019 Stress Test - patient states he may have had one but isn't sure when, knows is has not been in the last 10 years ECHO - 05/14/2019 Cardiac Cath - 06/20/2019  Sleep Study - patient denies CPAP - n/a  Fasting Blood Sugar - n/a Checks Blood Sugar _____ times a day  Blood Thinner Instructions: n/a Aspirin Instructions:   ERAS Protcol - n/a PRE-SURGERY Ensure or G2-   COVID TEST- scheduled for 1230 after PAT appointment    Anesthesia review: yes, cardiac history  Patient denies shortness of breath, fever, cough and chest pain at PAT appointment   Coronavirus Screening  Have you experienced the following symptoms:  Cough yes/no: No Fever (>100.68F)  yes/no: No Runny nose yes/no: No Sore throat yes/no: No Difficulty breathing/shortness of breath  yes/no: No  Have you or a family member traveled in the last 14 days and where? yes/no: No   If the patient indicates "YES" to the above questions, their PAT will be rescheduled to limit the exposure to others and, the surgeon will be notified. THE PATIENT WILL NEED TO BE ASYMPTOMATIC FOR 14 DAYS.   If the patient is not experiencing any of these symptoms, the PAT nurse will instruct them to NOT bring anyone with them to their appointment since they may have these symptoms or traveled as well.   Please remind your patients and families that hospital visitation restrictions are in effect and the importance of the restrictions.     All instructions explained to the patient, with a verbal understanding of the material. Patient agrees to go over the instructions while at home for a better understanding. Patient also instructed to self quarantine after being tested for COVID-19. The opportunity to ask questions was provided.

## 2019-07-01 LAB — NOVEL CORONAVIRUS, NAA (HOSP ORDER, SEND-OUT TO REF LAB; TAT 18-24 HRS): SARS-CoV-2, NAA: NOT DETECTED

## 2019-07-02 MED ORDER — MAGNESIUM SULFATE 50 % IJ SOLN
40.0000 meq | INTRAMUSCULAR | Status: DC
  Filled 2019-07-02: qty 9.85

## 2019-07-02 MED ORDER — POTASSIUM CHLORIDE 2 MEQ/ML IV SOLN
80.0000 meq | INTRAVENOUS | Status: DC
  Filled 2019-07-02: qty 40

## 2019-07-02 MED ORDER — DEXMEDETOMIDINE HCL IN NACL 400 MCG/100ML IV SOLN
0.1000 ug/kg/h | INTRAVENOUS | Status: AC
  Administered 2019-07-03: 1 ug/kg/h via INTRAVENOUS
  Filled 2019-07-02: qty 100

## 2019-07-02 MED ORDER — SODIUM CHLORIDE 0.9 % IV SOLN
1.5000 g | INTRAVENOUS | Status: AC
  Administered 2019-07-03: 1.5 g via INTRAVENOUS
  Filled 2019-07-02: qty 1.5

## 2019-07-02 MED ORDER — SODIUM CHLORIDE 0.9 % IV SOLN
INTRAVENOUS | Status: DC
  Filled 2019-07-02: qty 30

## 2019-07-02 MED ORDER — VANCOMYCIN HCL 10 G IV SOLR
1500.0000 mg | INTRAVENOUS | Status: AC
  Administered 2019-07-03: 1500 mg via INTRAVENOUS
  Filled 2019-07-02: qty 1500

## 2019-07-02 MED ORDER — NOREPINEPHRINE 4 MG/250ML-% IV SOLN
0.0000 ug/min | INTRAVENOUS | Status: DC
  Filled 2019-07-02: qty 250

## 2019-07-02 NOTE — H&P (Signed)
[address]       [place] [address]             [phone]      Cardiothoracic Surgery Admission History and Physical    Referring Provider is Revankar, Reita Cliche, MD  Primary Cardiologist is No primary care provider on file.  PCP is Nicoletta Dress, MD      Chief Complaint  Patient presents with   Prosthetic Aortic Valve Stenosis       HPI:  Patient is a 73 year old gentleman with a history of hypertension, hyperlipidemia, and atrial tachycardia status post ablation who underwent bioprosthetic Bentall procedure by me in 2010 at [facility]. He had a 25 mm Edwards Magna-Ease pericardial valve inside a Gelweave Valsalva graft. The valve model was 3300 TFX. He has done well since that surgery now presents with a couple month history of exertional fatigue such as doing yard work. His wife said that he has to rest frequently and is tired at the end of the day. He denies any shortness of breath or chest pain. Has had no dizziness or syncope. He denies any edema. He has a history of atrial fibrillation and underwent A. fib ablation in 2017. He continues to take sotalol for rhythm control. He has had periodic echocardiograms to follow-up on his prosthetic valve. His most recent echocardiogram 05/14/2011 showed an increase in the mean gradient to 36.5 mmHg with a peak gradient of 60 mm. The dimensionless index of 0.26. Left ventricular ejection fraction was 60 to 65%. His mean aortic valve gradient 1 year ago was 22 mmHg.   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     Past Medical History:  Diagnosis Date   Atrial tachycardia [facility])    s/p ablation and on sotolol   GERD (gastroesophageal reflux disease)    H/O cardiac radiofrequency ablation 08/2016   Hyperlipidemia    Hypertension    S/P AVR (aortic valve replacement) 2010        Past Surgical History:  Procedure Laterality Date   ABLATION OF DYSRHYTHMIC FOCUS     CARDIAC VALVE  REPLACEMENT     CHOLECYSTECTOMY     KIDNEY STONE SURGERY     KNEE ARTHROSCOPY Bilateral    MASS EXCISION Right 12/26/2017   Procedure: RIGHT INDEX EXCISION CYST AND DEBRIDEMENT OF DISTAL INTERPHALANGEAL JOINT; Surgeon: Leanora Cover, MD; Location: [facility]; Service: Orthopedics; Laterality: Right; Bier block   RIGHT HEART CATH AND CORONARY ANGIOGRAPHY N/A 06/20/2019   Procedure: RIGHT HEART CATH AND CORONARY ANGIOGRAPHY; Surgeon: Sherren Mocha, MD; Location: [facility]; Service: Cardiovascular; Laterality: N/A;        Family History  Problem Relation Age of Onset   CAD Brother    Social History        Socioeconomic History   Marital status: Married    Spouse name: Not on file   Number of children: Not on file   Years of education: Not on file   Highest education level: Not on file  Occupational History   Not on file  Social Needs   Financial resource strain: Not on file   Food insecurity    Worry: Not on file    Inability: Not on file   Transportation needs    Medical: Not on file    Non-medical: Not on file  Tobacco Use   Smoking status: Never Smoker   Smokeless tobacco: Never  Used  Substance and Sexual Activity   Alcohol use: No    Frequency: Never   Drug use: No   Sexual activity: Not on file  Lifestyle   Physical activity    Days per week: Not on file    Minutes per session: Not on file   Stress: Not on file  Relationships   Social connections    Talks on phone: Not on file    Gets together: Not on file    Attends religious service: Not on file    Active member of club or organization: Not on file    Attends meetings of clubs or organizations: Not on file    Relationship status: Not on file   Intimate partner violence    Fear of current or ex partner: Not on file    Emotionally abused: Not on file    Physically abused: Not on file    Forced sexual activity: Not on file  Other Topics Concern   Not on file    Social History Narrative   Not on file         Current Outpatient Medications  Medication Sig Dispense Refill   aspirin EC 81 MG tablet Take 81 mg by mouth daily.      fexofenadine (ALLEGRA) 180 MG tablet Take 180 mg by mouth 2 (two) times daily.      rosuvastatin (CRESTOR) 10 MG tablet TAKE ONE TABLET BY MOUTH EVERY DAY IN THE EVENING (Patient taking differently: Take 10 mg by mouth every evening. ) 90 tablet 3   sotalol (BETAPACE) 80 MG tablet Take 40 mg by mouth 2 (two) times daily.     temazepam (RESTORIL) 15 MG capsule Take 15 mg by mouth at bedtime.      triamterene-hydrochlorothiazide (MAXZIDE-25) 37.5-25 MG tablet TAKE ONE TABLET BY MOUTH EVERY DAY (Patient taking differently: Take 1 tablet by mouth daily. ) 90 tablet 1   diazepam (VALIUM) 5 MG tablet Take 5mg  (one tablet) 1 hour prior to CT scan. You may take the second 5mg  tablet (for a total of 10mg ) if necessary. (Patient not taking: Reported on 06/27/2019) 2 tablet 0   omeprazole (PRILOSEC) 20 MG capsule Take 20 mg by mouth daily.      sodium chloride (OCEAN) 0.65 % SOLN nasal spray Place 1 spray into both nostrils 3 (three) times daily as needed for congestion.     tamsulosin (FLOMAX) 0.4 MG CAPS capsule Take 0.4 mg by mouth daily after supper.      No current facility-administered medications for this visit.    No Known Allergies   Review of Systems:   General: normal appetite, + decreased energy, no weight gain, no weight loss, no fever  Cardiac: no chest pain with exertion, no chest pain at rest, no SOB with exertion, no resting SOB, no PND, no orthopnea, no palpitations, + arrhythmia, + atrial fibrillation, no LE edema, no dizzy spells, no syncope  Respiratory: no shortness of breath, no home oxygen, no productive cough, no dry cough, no bronchitis, no wheezing, no hemoptysis, no asthma, no pain with inspiration or cough, no sleep apnea, no CPAP at night  GI: no difficulty swallowing, no reflux, no frequent  heartburn, no hiatal hernia, no abdominal pain, no constipation, no diarrhea, no hematochezia, no hematemesis, no melena  GU: no dysuria, no frequency, no urinary tract infection, no hematuria, no enlarged prostate, no kidney stones, no kidney disease  Vascular: no pain suggestive of claudication, no pain in feet, no leg  cramps, no varicose veins, no DVT, no non-healing foot ulcer  Neuro: no stroke, no TIA's, no seizures, no headaches, no temporary blindness one eye, no slurred speech, no peripheral neuropathy, no chronic pain, o instability of gait, no memory/cognitive dysfunction  Musculoskeletal: + arthritis, no joint swelling, no myalgias, no difficulty walking, normal mobility  Skin: no rash, no itching, no skin infections, no pressure sores or ulcerations  Psych: no anxiety, no depression, no nervousness, no unusual recent stress  Eyes: no blurry vision, no floaters, no recent vision changes, + wears glasses or contacts  ENT: no hearing loss, no loose or painful teeth, no dentures, last saw dentist every 6 months.  Hematologic: no easy bruising, no abnormal bleeding, no clotting disorder, no frequent epistaxis  Endocrine: no diabetes, does not check CBG's at home    Physical Exam:   BP (!) 133/91 (BP Location: Right Arm)   Pulse 86   Temp (!) 97.5 F (36.4 C) (Skin) Comment: RA   Resp 20   Wt 186 lb 6.4 oz (84.6 kg)   SpO2 94% Comment: RA   BMI 27.53 kg/m  General: well-appearing  HEENT: Unremarkable, NCAT, PERLA, EOMI, teeth in good condition  Neck: no JVD, no bruits, no adenopathy  Chest: clear to auscultation, symmetrical breath sounds, no wheezes, no rhonchi  CV: RRR, grade lll/VI crescendo/decrescendo murmur heard best at RSB, no diastolic murmur  Abdomen: soft, non-tender, no masses  Extremities: warm, well-perfused, pulses palpable in feet, no LE edema  Rectal/GU Deferred  Neuro: Grossly non-focal and symmetrical throughout  Skin: Clean and dry, no rashes, no breakdown    Diagnostic Tests:   ECHOCARDIOGRAM REPORT  Patient Name: JEROLD CONSTANTINIDES Date of Exam: 05/14/2019  Medical Rec #: [id number] Height: 68.5 in  Accession #: [id number] Weight: 189.0 lb  Date of Birth: [birth year] BSA: 2.01 m  Patient Age: 46 years BP: 142/76 mmHg  Patient Gender: M HR: 62 bpm.  Exam Location: [facility]  Procedure: 2D Echo  Indications: S/P AVR (aortic valve replacement) [Z95.2] - Primary  History: Patient has prior history of Echocardiogram examinations, most  recent 07/18/2018. Atrial Fibrillation Risk Factors: Hypertension  and Dyslipidemia.  Sonographer: Luane School  Referring Phys: [place]  1. The left ventricle has normal systolic function with an ejection fraction of 60-65%. The cavity size was normal. There is moderately increased left ventricular wall thickness. Left ventricular diastolic Doppler parameters are consistent with impaired  relaxation.  2. The right ventricle has normal systolic function. The cavity was normal. There is no increase in right ventricular wall thickness.  3. Left atrial size was mild-moderately dilated.  4. The mitral valve is grossly normal.  5. The tricuspid valve is grossly normal.  6. Aortic valve regurgitation was not assessed by color flow Doppler. Severe stenosis of the aortic valve.  7. The patient has a bioprosthetic valve.  8. Aneurysm of the ascending aorta, measuring 46 mm.  9. The aorta is abnormal unless otherwise noted.  FINDINGS  Left Ventricle: The left ventricle has normal systolic function, with an ejection fraction of 60-65%. The cavity size was normal. There is moderately increased left ventricular wall thickness. Left ventricular diastolic Doppler parameters are consistent  with impaired relaxation.  Right Ventricle: The right ventricle has normal systolic function. The cavity was normal. There is no increase in right ventricular wall thickness.  Left Atrium: Left atrial size was  mild-moderately dilated.  Right Atrium: Right atrial size was normal in size. Right  atrial pressure is estimated at 3 mmHg.  Interatrial Septum: No atrial level shunt detected by color flow Doppler.  Pericardium: There is no evidence of pericardial effusion.  Mitral Valve: The mitral valve is grossly normal. Mitral valve regurgitation was not assessed by color flow Doppler.  Tricuspid Valve: The tricuspid valve is grossly normal. Tricuspid valve regurgitation is trivial by color flow Doppler.  Aortic Valve: The aortic valve has been repaired/replaced Aortic valve regurgitation was not assessed by color flow Doppler. There is Severe stenosis of the aortic valve, with a calculated valve area of 1.00 cm. The patient has a bioprosthetic valve.  Pulmonic Valve: The pulmonic valve was not well visualized. Pulmonic valve regurgitation was not assessed by color flow Doppler.  Aorta: The aorta is abnormal unless otherwise noted. There is an aneurysm involving the ascending aorta. The aneurysm measures 46 mm.  Venous: The inferior vena cava is normal in size with greater than 50% respiratory variability.  +--------------+--------++   LEFT VENTRICLE     +----------------+---------++  +--------------+--------++  Diastology        PLAX 2D      +----------------+---------++  +--------------+--------++  LV e' lateral:  6.09 cm/s     LVIDd:  4.20 cm    +----------------+---------++  +--------------+--------++  LV E/e' lateral: 12.3      LVIDs:  2.90 cm    +----------------+---------++  +--------------+--------++  LV e' medial:  4.57 cm/s     LV PW:  1.40 cm    +----------------+---------++  +--------------+--------++  LV E/e' medial:  16.5      LV IVS:  1.50 cm    +----------------+---------++  +--------------+--------++   LVOT diam:  2.20 cm     +--------------+--------++   LV SV:  46 ml     +--------------+--------++   LV SV Index:  22.56     +--------------+--------++   LVOT Area:  3.80 cm      +--------------+--------++          +--------------+--------++  +---------------+---------++   RIGHT VENTRICLE      +---------------+---------++   RV S prime:  8.05 cm/s    +---------------+---------++   TAPSE (M-mode): 1.6 cm     +---------------+---------++  +---------------+-------++-----------++   LEFT ATRIUM     Index     +---------------+-------++-----------++   LA diam:  4.50 cm  2.24 cm/m     +---------------+-------++-----------++   LA Vol (A2C):  61.5 ml  30.66 ml/m    +---------------+-------++-----------++   LA Vol (A4C):  90.6 ml  45.16 ml/m    +---------------+-------++-----------++   LA Biplane Vol: 76.3 ml  38.03 ml/m    +---------------+-------++-----------++  +------------+---------++-----------++   RIGHT ATRIUM    Index     +------------+---------++-----------++   RA Area:  17.50 cm       +------------+---------++-----------++   RA Volume:  38.50 ml   19.19 ml/m    +------------+---------++-----------++  +------------------+------------++   AORTIC VALVE       +------------------+------------++   AV Area (Vmax):  0.89 cm     +------------------+------------++   AV Area (Vmean):  0.93 cm     +------------------+------------++   AV Area (VTI):  1.00 cm     +------------------+------------++   AV Vmax:  386.50 cm/s     +------------------+------------++   AV Vmean:  284.500 cm/s    +------------------+------------++   AV VTI:  0.866 m     +------------------+------------++   AV Peak Grad:  59.8 mmHg     +------------------+------------++   AV Mean Grad:  36.5 mmHg     +------------------+------------++   LVOT Vmax:  90.90 cm/s     +------------------+------------++   LVOT Vmean:  69.600 cm/s     +------------------+------------++   LVOT VTI:  0.228 m     +------------------+------------++   LVOT/AV VTI ratio: 0.26     +------------------+------------++  +-------------+-------++   AORTA       +-------------+-------++   Ao Root  diam: 4.20 cm    +-------------+-------++   Ao Asc diam:  4.70 cm    +-------------+-------++  +--------------+----------++ +---------------+-----------++   MITRAL VALVE       TRICUSPID VALVE      +--------------+----------++ +---------------+-----------++   MV Area (PHT): 2.26 cm     TR Peak grad:  9.4 mmHg     +--------------+----------++ +---------------+-----------++   MV PHT:  97.15 msec    TR Vmax:  153.00 cm/s    +--------------+----------++ +---------------+-----------++   MV Decel Time: 335 msec     +--------------+----------++ +--------------+-------+  +--------------+-----------++  SHUNTS       MV E velocity: 75.20 cm/s    +--------------+-------+  +--------------+-----------++  Systemic VTI:  0.23 m     MV A velocity: 108.00 cm/s   +--------------+-------+  +--------------+-----------++  Systemic Diam: 2.20 cm    MV E/A ratio:  0.70    +--------------+-------+  +--------------+-----------++  Jyl Heinz MD  Electronically signed by Jyl Heinz MD  Signature Date/Time: 05/14/2019/3:13:21 PM      Panel Physicians Referring Physician Case Authorizing Physician  Sherren Mocha, MD (Primary)    Procedures  RIGHT HEART CATH AND CORONARY ANGIOGRAPHY  Conclusion  1. Widely patent coronary arteries without stenosis  2. Normal right heart hemodynamics  3. Known severe bioprosthetic aortic valve stenosis by echo assessment  Plan: continue multidisciplinary heart team evaluation for treatment of severe bioprosthetic aortic stenosis.   Indications  Severe aortic stenosis [I35.0 (ICD-10-CM)]  Procedural Details  Technical Details INDICATION: Severe bioprosthetic aortic valve stenosis  PROCEDURAL DETAILS: There was an indwelling IV in a right antecubital vein. Using normal sterile technique, the IV was changed out for a 5 Fr brachial sheath over a 0.018 inch wire. The right wrist was then prepped, draped, and anesthetized with 1% lidocaine. Using the modified Seldinger  technique a 5/6 French Slender sheath was placed in the right radial artery. Intra-arterial verapamil was administered through the radial artery sheath. IV heparin was administered after a JR4 catheter was advanced into the central aorta. A Swan-Ganz catheter was used for the right heart catheterization. Standard protocol was followed for recording of right heart pressures and sampling of oxygen saturations. Fick cardiac output was calculated. Standard Judkins catheters were used for selective coronary angiography. The aortic valve was difficult to cross from radial access. The valve was crossed with an AL-1 catheter directing a straight wire, but the AL-1 would not advance into the left ventricle. I then tried to cross the valve with a MPA catheter but this was unsuccessful. Catheters, wires, and sheaths were removed without complication. There were no immediate procedural complications. The patient was transferred to the post catheterization recovery area for further monitoring.    Estimated blood loss <50 mL.   During this procedure medications were administered to achieve and maintain moderate conscious sedation while the patient's heart rate, blood pressure, and oxygen saturation were continuously monitored and I was present face-to-face 100% of this time.  Medications  (Filter: Administrations occurring from 06/20/19 1322 to 06/20/19 1425)          (important)  Continuous medications are totaled by the amount administered until 06/20/19 1425.  Medication Rate/Dose/Volume Action  Date Time   Heparin (Porcine) in NaCl 1000-0.9 UT/500ML-% SOLN (mL) 500 mL Given 06/20/19 1341   Total dose as of 06/20/19 1425 500 mL Given 1341   1,000 mL        fentaNYL (SUBLIMAZE) injection (mcg) 25 mcg Given 06/20/19 1346   Total dose as of 06/20/19 1425        25 mcg        midazolam (VERSED) injection (mg) 1 mg Given 06/20/19 1346   Total dose as of 06/20/19 1425        1 mg        lidocaine (PF) (XYLOCAINE)  1 % injection (mL) 2 mL Given 06/20/19 1346   Total dose as of 06/20/19 1425 2 mL Given 1347   4 mL        Radial Cocktail/Verapamil only (mL) 10 mL Given 06/20/19 1347   Total dose as of 06/20/19 1425        10 mL        heparin injection (Units) 4,000 Units Given 06/20/19 1355   Total dose as of 06/20/19 1425        4,000 Units        iohexol (OMNIPAQUE) 350 MG/ML injection (mL) 60 mL Given 06/20/19 1415   Total dose as of 06/20/19 1425        60 mL        Sedation Time  Sedation Time Physician-1: 26 minutes 14 seconds  Contrast  Medication Name Total Dose  iohexol (OMNIPAQUE) 350 MG/ML injection 60 mL  Radiation/Fluoro  Fluoro time: 10.3 (min)  DAP: 20242 (mGycm2)  Cumulative Air Kerma: 374 (mGy)  Coronary Findings  Diagnostic  Dominance: Right  Left Main  Vessel is angiographically normal.  Left Anterior Descending  The vessel exhibits minimal luminal irregularities.  Ramus Intermedius  Vessel is moderate in size. Vessel is angiographically normal.  Left Circumflex  Vessel is angiographically normal.  Right Coronary Artery  Vessel is moderate in size. Vessel is angiographically normal.  Right Ventricular Branch  Widely patent, dominant RCA with no stenosis  Intervention  No interventions have been documented.  Coronary Diagrams  Diagnostic  Dominance: Right   Intervention  Implants     No implant documentation for this case.  Syngo Images  Link to Procedure Log   Show images for CARDIAC CATHETERIZATION Procedure Log  Images on Long Term Storage    Show images for Carlyn, Mccollum   Hemo Data   Most Recent Value  Fick Cardiac Output 5.06 L/min  Fick Cardiac Output Index 2.53 (L/min)/BSA  RA A Wave 6 mmHg  RA V Wave 5 mmHg  RA Mean 3 mmHg  RV Systolic Pressure 24 mmHg  RV Diastolic Pressure 0 mmHg  RV EDP 6 mmHg  PA Systolic Pressure 23 mmHg  PA Diastolic Pressure 0 mmHg  PA Mean 12 mmHg  PW A Wave 11 mmHg  PW V Wave 12 mmHg  PW Mean 8 mmHg  AO  Systolic Pressure [id number] mmHg  AO Diastolic Pressure 72 mmHg  AO Mean 97 mmHg  QP/QS 1  TPVR Index 4.74 HRUI  TSVR Index 38.33 HRUI  PVR SVR Ratio 0.04  TPVR/TSVR Ratio 0.12     ADDENDUM REPORT: 06/12/2019 11:26  CLINICAL DATA: Aortic stenosis  EXAM:  Cardiac TAVR CT  TECHNIQUE:  The patient was scanned on a Siemens Force [id number] slice scanner.  A 120  kV retrospective scan was triggered in the descending thoracic aorta  at 111 HU's. Gantry rotation speed was 270 msecs and collimation was  .9 mm. No beta blockade or nitro were given. The 3D data set was  reconstructed in 5% intervals of the R-R cycle. Systolic and  diastolic phases were analyzed on a dedicated work station using  MPR, MIP and VRT modes. The patient received 80 cc of contrast.  FINDINGS:  Aortic Valve: Patient has had a Bentall procedure with AVR and root  replacement Stented pericardial tissue valve 25 mm Edwards  MagnaEase. Thickened leaflets with restricted motion. Annulus intact  ID measures 24 mm consistent with described valve replacement.  Aorta: Dilated and angulated ascending root. Suture line noted for  Bentall bovine arch with moderate calcific atherosclerosis  Ascending Thoracic Aorta: 3.9 cm  Aortic Arch: 3.1 cm  Descending Thoracic Aorta: 2.9 cm  Sinus of Valsalva Measurements:  Non-coronary: 38.6 mm  Right - coronary: 38.2 mm  Left - coronary: 38 mm  Coronary Artery Height above Annulus:  Left Main: 19.9 mm above annulus and above valve strut. SA 11 mm  away from virtual valve at ostium  Right Coronary: 16.1 mm above annulus and near top edge of valve  strut SA 12.6 mm away from virtual valve ostium  IMPRESSION:  1. 25 mm Edwards stented pericardial tissue valve with ID 24 mm  MagnaEase. Thickened leaflets with restricted motion. Intact annulus  2. Angulated and dilated aortic root 3.9 cm Appears to have had  Bentall with root replacement Bovine Arch  3. Coronary arteries appear to have sufficient  height above valve  struts and SA depth to avoid occlusion with valve in valve procedure  4. Appropriate for 26 mm Sapien 3 valve in valve TAVR  Jenkins Rouge  Electronically Signed  By: Jenkins Rouge M.D.  On: 06/12/2019 11:26   Addended by Josue Hector, MD on 06/12/2019 11:29 AM  Study Result   EXAM:  OVER-READ INTERPRETATION CT CHEST  The following report is an over-read performed by radiologist Dr.  Vinnie Langton of [facility], [place] on 06/12/2019. This  over-read does not include interpretation of cardiac or coronary  anatomy or pathology. The coronary calcium score/coronary CTA  interpretation by the cardiologist is attached.  COMPARISON: Chest CTA 07/24/2018.  FINDINGS:  Extracardiac findings will be described separately under dictation  for contemporaneously obtained CTA chest, abdomen and pelvis.  IMPRESSION:  Please see separate dictation for contemporaneously obtained CTA  chest, abdomen and pelvis dated 06/12/2019 for full description of  relevant extracardiac findings.  Electronically Signed:  By: Vinnie Langton M.D.  On: 06/12/2019 10:28  CLINICAL DATA: 73 year old male with history of severe aortic  stenosis. Preprocedural study prior to potential transcatheter  aortic valve replacement (TAVR) procedure.  EXAM:  CT ANGIOGRAPHY CHEST, ABDOMEN AND PELVIS  TECHNIQUE:  Multidetector CT imaging through the chest, abdomen and pelvis was  performed using the standard protocol during bolus administration of  intravenous contrast. Multiplanar reconstructed images and MIPs were  obtained and reviewed to evaluate the vascular anatomy.  CONTRAST: 128mL OMNIPAQUE IOHEXOL 350 MG/ML SOLN  COMPARISON: CTA chest 07/24/2018.  FINDINGS:  CTA CHEST FINDINGS  Cardiovascular: Heart size is normal. There is no significant  pericardial fluid, thickening or pericardial calcification. There is  aortic atherosclerosis, as well as atherosclerosis of the great  vessels of the  mediastinum and the coronary arteries, including  calcified atherosclerotic plaque in the left main and left anterior  descending coronary arteries. Status post median sternotomy for  aortic root replacement with a stented bioprosthesis.  Mediastinum/Lymph Nodes: No pathologically enlarged mediastinal or  hilar lymph nodes. Esophagus is unremarkable in appearance. No  axillary lymphadenopathy.  Lungs/Pleura: No suspicious appearing pulmonary nodules or masses  are noted. No acute consolidative airspace disease. No pleural  effusions.  Musculoskeletal/Soft Tissues: Median sternotomy wires. There are no  aggressive appearing lytic or blastic lesions noted in the  visualized portions of the skeleton.  CTA ABDOMEN AND PELVIS FINDINGS  Hepatobiliary: No suspicious cystic or solid hepatic lesions. No  intra or extrahepatic biliary ductal dilatation. Status post  cholecystectomy.  Pancreas: No pancreatic mass. No pancreatic ductal dilatation. No  pancreatic or peripancreatic fluid collections or inflammatory  changes.  Spleen: Unremarkable.  Adrenals/Urinary Tract: Bilateral kidneys and bilateral adrenal  glands are normal in appearance. No hydroureteronephrosis. Urinary  bladder is normal in appearance.  Stomach/Bowel: Normal appearance of the stomach. No pathologic  dilatation of small bowel or colon. Numerous colonic diverticulae  are noted, without surrounding inflammatory changes to suggest an  acute diverticulitis at this time. Normal appendix.  Vascular/Lymphatic: Aortic atherosclerosis, without evidence of  aneurysm or dissection in the abdominal or pelvic vasculature.  Vascular findings and measurements pertinent to potential TAVR  procedure, as detailed below. No lymphadenopathy noted in the  abdomen or pelvis.  Reproductive: Prostate gland and seminal vesicles are unremarkable  in appearance.  Other: No significant volume of ascites. No pneumoperitoneum.  Musculoskeletal:  There are no aggressive appearing lytic or blastic  lesions noted in the visualized portions of the skeleton.  VASCULAR MEASUREMENTS PERTINENT TO TAVR:  AORTA:  Minimal Aortic Diameter-17 x 18 mm  Severity of Aortic Calcification-mild  RIGHT PELVIS:  Right Common Iliac Artery -  Minimal Diameter-13.2 x 10.9 mm  Tortuosity-mild  Calcification-mild  Right External Iliac Artery -  Minimal Diameter-11.1 x 11.5 mm  Tortuosity-moderate  Calcification-minimal  Right Common Femoral Artery -  Minimal Diameter-12.1 x 10.5 mm  Tortuosity-mild  Calcification-mild  LEFT PELVIS:  Left Common Iliac Artery -  Minimal Diameter-12.4 x 10.3 mm  Tortuosity-mild  Calcification-mild  Left External Iliac Artery -  Minimal Diameter-12.4 x 12.0 mm  Tortuosity-moderate  Calcification-none  Left Common Femoral Artery -  Minimal Diameter-13.4 x 12.2 mm  Tortuosity-mild  Calcification-mild  Review of the MIP images confirms the above findings.  IMPRESSION:  1. Vascular findings and measurements pertinent to potential TAVR  procedure, as detailed above.  2. Aortic atherosclerosis, in addition to left main and left  anterior descending coronary artery disease.  3. Status post aortic valve replacement with stented bioprosthesis.  4. Colonic diverticulosis without evidence of acute diverticulitis  at this time.  5. Additional incidental findings, as above.  Electronically Signed  By: Vinnie Langton M.D.  On: 06/12/2019 11:08    STS RISK CALCULATOR:   Redo aortic valve replacement:  Risk of Mortality:  1.129%  Renal Failure:  1.326%  Permanent Stroke:  1.556%  Prolonged Ventilation:  4.238%  DSW Infection:  0.106%  Reoperation:  3.328%  Morbidity or Mortality:  7.595%  Short Length of Stay:  49.567%  Long Length of Stay:  3.426%    Impression:   This 73 year old gentleman has stage D, severe, symptomatic prosthetic aortic stenosis New York Heart Association class II symptoms to  moderate physical activity consistent with chronic diastolic congestive heart failure. Says that he feels fairly well overall and was 145 units very tired at the end of the day  and has to stop frequently when doing physical activity. His aerobic physical activity is somewhat limited by his arthritis although he continues to be active doing things around the house and yard. I have personally reviewed his 2D echocardiogram, cardiac catheterization, and CTA studies. He has a 25 mm pericardial aortic valve prosthesis that is thickened with reduced leaflet mobility. The mean gradient has increased from 22 mmHg 1 year ago to 36.5 mmHg now. Left ventricular systolic function is normal. Cardiac catheterization shows no coronary disease. I agree that it is probably time to proceed with aortic valve replacement given the degree of progression of the past year. I think he will probably become progressively more symptomatic over the next 6 months if this is not treated. I think the operative risk for open surgical replacement morbidity significantly higher than that predicted by the STS risk calculator since he had a composite bioprosthesis/graft Bentall procedure. This would require redo root replacement. I think valve in valve TAVR would be the best option for treating this patient. Is 25 mm Edwards Magna-Ease pericardial valve can be replaced with 26 mm Sapien 3 valve. His gated cardiac CTA shows anatomy suitable for valve in valve TAVR. His abdominal and pelvic CTA shows adequate pelvic vascular anatomy to allow transfemoral insertion.  The patient and his wife were counseled at length regarding treatment alternatives for management of severe symptomatic aortic stenosis. The risks and benefits of surgical intervention has been discussed in detail. Long-term prognosis with medical therapy was discussed. Alternative approaches such as conventional surgical aortic valve replacement, transcatheter aortic valve replacement, and  palliative medical therapy were compared and contrasted at length. This discussion was placed in the context of the patient's own specific clinical presentation and past medical history. All of their questions have been addressed.  Following the decision to proceed with transcatheter aortic valve replacement, a discussion was held regarding what types of management strategies would be attempted intraoperatively in the event of life-threatening complications, including whether or not the patient would be considered a candidate for the use of cardiopulmonary bypass and/or conversion to open sternotomy for attempted surgical intervention. I do not think he would be a candidate for emergent redo sternotomy to manage any intraoperative complications. The patient has been advised of a variety of complications that might develop including but not limited to risks of death, stroke, paravalvular leak, aortic dissection or other major vascular complications, aortic annulus rupture, device embolization, cardiac rupture or perforation, mitral regurgitation, acute myocardial infarction, arrhythmia, heart block or bradycardia requiring permanent pacemaker placement, congestive heart failure, respiratory failure, renal failure, pneumonia, infection, other late complications related to structural valve deterioration or migration, or other complications that might ultimately cause a temporary or permanent loss of functional independence or other long term morbidity. The patient provides full informed consent for the procedure as described and all questions were answered.   Plan:   Transfemoral valve in valve TAVR using a Sapien 3 valve.   Gaye Pollack, MD

## 2019-07-02 NOTE — Anesthesia Preprocedure Evaluation (Addendum)
Anesthesia Evaluation  Patient identified by MRN, date of birth, ID band Patient awake    Reviewed: Allergy & Precautions, NPO status , Patient's Chart, lab work & pertinent test results  Airway Mallampati: II  TM Distance: >3 FB Neck ROM: Full    Dental  (+) Teeth Intact, Dental Advisory Given   Pulmonary neg pulmonary ROS,    Pulmonary exam normal breath sounds clear to auscultation       Cardiovascular hypertension, Pt. on medications Normal cardiovascular exam+ dysrhythmias (Atrial tachycardia s/p ablation) + Valvular Problems/Murmurs (s/p AVR 2010) AS  Rhythm:Regular Rate:Normal  Echo 05/14/2019:  1. The left ventricle has normal systolic function with an ejection fraction of 60-65%. The cavity size was normal. There is moderately increased left ventricular wall thickness. Left ventricular diastolic Doppler parameters are consistent with impaired  relaxation.  2. The right ventricle has normal systolic function. The cavity was normal. There is no increase in right ventricular wall thickness.  3. Left atrial size was mild-moderately dilated.  4. The mitral valve is grossly normal.  5. The tricuspid valve is grossly normal.  6. Aortic valve regurgitation was not assessed by color flow Doppler. Severe stenosis of the aortic valve.  7. The patient has a bioprosthetic valve.  8. Aneurysm of the ascending aorta, measuring 46 mm.  9. The aorta is abnormal unless otherwise noted.   Neuro/Psych negative neurological ROS  negative psych ROS   GI/Hepatic Neg liver ROS, GERD  ,  Endo/Other  negative endocrine ROS  Renal/GU negative Renal ROS     Musculoskeletal  (+) Arthritis ,   Abdominal   Peds  Hematology negative hematology ROS (+)   Anesthesia Other Findings Day of surgery medications reviewed with the patient.  Reproductive/Obstetrics                           Anesthesia Physical Anesthesia  Plan  ASA: IV  Anesthesia Plan: MAC   Post-op Pain Management:    Induction: Intravenous  PONV Risk Score and Plan: 1 and Propofol infusion  Airway Management Planned: Natural Airway and Nasal Cannula  Additional Equipment: Arterial line  Intra-op Plan:   Post-operative Plan:   Informed Consent: I have reviewed the patients History and Physical, chart, labs and discussed the procedure including the risks, benefits and alternatives for the proposed anesthesia with the patient or authorized representative who has indicated his/her understanding and acceptance.     Dental advisory given  Plan Discussed with: CRNA  Anesthesia Plan Comments:        Anesthesia Quick Evaluation

## 2019-07-03 ENCOUNTER — Inpatient Hospital Stay (HOSPITAL_COMMUNITY): Payer: PPO | Admitting: Certified Registered"

## 2019-07-03 ENCOUNTER — Other Ambulatory Visit: Payer: Self-pay

## 2019-07-03 ENCOUNTER — Encounter (HOSPITAL_COMMUNITY): Payer: Self-pay

## 2019-07-03 ENCOUNTER — Inpatient Hospital Stay (HOSPITAL_BASED_OUTPATIENT_CLINIC_OR_DEPARTMENT_OTHER): Payer: PPO

## 2019-07-03 ENCOUNTER — Encounter (HOSPITAL_COMMUNITY)
Admission: RE | Disposition: A | Discharge status: Home / Self Care | Payer: Self-pay | Source: Home / Self Care | Attending: Cardiovascular Disease

## 2019-07-03 ENCOUNTER — Inpatient Hospital Stay (HOSPITAL_COMMUNITY): Payer: PPO | Admitting: Vascular Surgery

## 2019-07-03 ENCOUNTER — Inpatient Hospital Stay (HOSPITAL_COMMUNITY)
Admission: RE | Admit: 2019-07-03 | Discharge: 2019-07-04 | DRG: 264 | Disposition: A | Discharge status: Home / Self Care | Payer: PPO | Attending: Cardiovascular Disease | Admitting: Cardiovascular Disease

## 2019-07-03 DIAGNOSIS — Z8679 Personal history of other diseases of the circulatory system: Secondary | ICD-10-CM

## 2019-07-03 DIAGNOSIS — M1990 Unspecified osteoarthritis, unspecified site: Secondary | ICD-10-CM | POA: Diagnosis not present

## 2019-07-03 DIAGNOSIS — I5032 Chronic diastolic (congestive) heart failure: Secondary | ICD-10-CM | POA: Diagnosis not present

## 2019-07-03 DIAGNOSIS — Z006 Encounter for examination for normal comparison and control in clinical research program: Secondary | ICD-10-CM

## 2019-07-03 DIAGNOSIS — I110 Hypertensive heart disease with heart failure: Secondary | ICD-10-CM | POA: Diagnosis not present

## 2019-07-03 DIAGNOSIS — Z20828 Contact with and (suspected) exposure to other viral communicable diseases: Secondary | ICD-10-CM | POA: Diagnosis present

## 2019-07-03 DIAGNOSIS — I712 Thoracic aortic aneurysm, without rupture: Secondary | ICD-10-CM | POA: Diagnosis present

## 2019-07-03 DIAGNOSIS — Z9049 Acquired absence of other specified parts of digestive tract: Secondary | ICD-10-CM | POA: Diagnosis not present

## 2019-07-03 DIAGNOSIS — I350 Nonrheumatic aortic (valve) stenosis: Secondary | ICD-10-CM

## 2019-07-03 DIAGNOSIS — Z87442 Personal history of urinary calculi: Secondary | ICD-10-CM

## 2019-07-03 DIAGNOSIS — M19041 Primary osteoarthritis, right hand: Secondary | ICD-10-CM | POA: Diagnosis not present

## 2019-07-03 DIAGNOSIS — I2510 Atherosclerotic heart disease of native coronary artery without angina pectoris: Secondary | ICD-10-CM | POA: Diagnosis not present

## 2019-07-03 DIAGNOSIS — Z7982 Long term (current) use of aspirin: Secondary | ICD-10-CM

## 2019-07-03 DIAGNOSIS — Z79899 Other long term (current) drug therapy: Secondary | ICD-10-CM

## 2019-07-03 DIAGNOSIS — K219 Gastro-esophageal reflux disease without esophagitis: Secondary | ICD-10-CM | POA: Diagnosis present

## 2019-07-03 DIAGNOSIS — I471 Supraventricular tachycardia: Secondary | ICD-10-CM | POA: Diagnosis present

## 2019-07-03 DIAGNOSIS — I10 Essential (primary) hypertension: Secondary | ICD-10-CM | POA: Diagnosis present

## 2019-07-03 DIAGNOSIS — I4719 Other supraventricular tachycardia: Secondary | ICD-10-CM | POA: Diagnosis present

## 2019-07-03 DIAGNOSIS — I77810 Thoracic aortic ectasia: Secondary | ICD-10-CM | POA: Diagnosis present

## 2019-07-03 DIAGNOSIS — Z954 Presence of other heart-valve replacement: Secondary | ICD-10-CM | POA: Diagnosis not present

## 2019-07-03 DIAGNOSIS — I083 Combined rheumatic disorders of mitral, aortic and tricuspid valves: Principal | ICD-10-CM | POA: Diagnosis present

## 2019-07-03 DIAGNOSIS — E785 Hyperlipidemia, unspecified: Secondary | ICD-10-CM | POA: Diagnosis present

## 2019-07-03 DIAGNOSIS — Z952 Presence of prosthetic heart valve: Secondary | ICD-10-CM

## 2019-07-03 DIAGNOSIS — Z9889 Other specified postprocedural states: Secondary | ICD-10-CM

## 2019-07-03 HISTORY — PX: TRANSCATHETER AORTIC VALVE REPLACEMENT, TRANSFEMORAL: SHX6400

## 2019-07-03 HISTORY — PX: TEE WITHOUT CARDIOVERSION: SHX5443

## 2019-07-03 LAB — POCT I-STAT, CHEM 8
BUN: 10 mg/dL (ref 8–23)
BUN: 9 mg/dL (ref 8–23)
BUN: 9 mg/dL (ref 8–23)
BUN: 8 mg/dL (ref 8–23)
Calcium, Ion: 1.28 mmol/L (ref 1.15–1.40)
Calcium, Ion: 1.2 mmol/L (ref 1.15–1.40)
Calcium, Ion: 1.3 mmol/L (ref 1.15–1.40)
Calcium, Ion: 1.28 mmol/L (ref 1.15–1.40)
Chloride: 105 mmol/L (ref 98–111)
Chloride: 104 mmol/L (ref 98–111)
Chloride: 104 mmol/L (ref 98–111)
Chloride: 106 mmol/L (ref 98–111)
Creatinine, Ser: 0.8 mg/dL (ref 0.61–1.24)
Creatinine, Ser: 0.7 mg/dL (ref 0.61–1.24)
Creatinine, Ser: 0.7 mg/dL (ref 0.61–1.24)
Creatinine, Ser: 0.8 mg/dL (ref 0.61–1.24)
Glucose, Bld: 97 mg/dL (ref 70–99)
Glucose, Bld: 119 mg/dL — ABNORMAL HIGH (ref 70–99)
Glucose, Bld: 135 mg/dL — ABNORMAL HIGH (ref 70–99)
Glucose, Bld: 116 mg/dL — ABNORMAL HIGH (ref 70–99)
HCT: 44 % (ref 39.0–52.0)
HCT: 40 % (ref 39.0–52.0)
HCT: 42 % (ref 39.0–52.0)
HCT: 42 % (ref 39.0–52.0)
Hemoglobin: 15 g/dL (ref 13.0–17.0)
Hemoglobin: 13.6 g/dL (ref 13.0–17.0)
Hemoglobin: 14.3 g/dL (ref 13.0–17.0)
Hemoglobin: 14.3 g/dL (ref 13.0–17.0)
Potassium: 3.3 mmol/L — ABNORMAL LOW (ref 3.5–5.1)
Potassium: 3.6 mmol/L (ref 3.5–5.1)
Potassium: 3.8 mmol/L (ref 3.5–5.1)
Potassium: 3.9 mmol/L (ref 3.5–5.1)
Sodium: 143 mmol/L (ref 135–145)
Sodium: 143 mmol/L (ref 135–145)
Sodium: 144 mmol/L (ref 135–145)
Sodium: 144 mmol/L (ref 135–145)
TCO2: 23 mmol/L (ref 22–32)
TCO2: 25 mmol/L (ref 22–32)
TCO2: 26 mmol/L (ref 22–32)
TCO2: 24 mmol/L (ref 22–32)

## 2019-07-03 SURGERY — IMPLANTATION, AORTIC VALVE, TRANSCATHETER, FEMORAL APPROACH
Anesthesia: Monitor Anesthesia Care | Site: Chest

## 2019-07-03 MED ORDER — SODIUM CHLORIDE 0.9% FLUSH
3.0000 mL | Freq: Two times a day (BID) | INTRAVENOUS | Status: DC
  Administered 2019-07-03 – 2019-07-04 (×2): 3 mL via INTRAVENOUS

## 2019-07-03 MED ORDER — SODIUM CHLORIDE 0.9 % IV SOLN: 250.0000 mL | INTRAVENOUS | Status: DC | PRN

## 2019-07-03 MED ORDER — FENTANYL CITRATE (PF) 100 MCG/2ML IJ SOLN
INTRAMUSCULAR | Status: DC | PRN
  Administered 2019-07-03: 50 ug via INTRAVENOUS

## 2019-07-03 MED ORDER — ASPIRIN EC 81 MG PO TBEC
81.0000 mg | DELAYED_RELEASE_TABLET | Freq: Every day | ORAL | Status: DC
  Administered 2019-07-04: 81 mg via ORAL
  Filled 2019-07-03: qty 1

## 2019-07-03 MED ORDER — TAMSULOSIN HCL 0.4 MG PO CAPS
0.4000 mg | ORAL_CAPSULE | Freq: Every day | ORAL | Status: DC
  Administered 2019-07-03: 0.4 mg via ORAL
  Filled 2019-07-03: qty 1

## 2019-07-03 MED ORDER — CHLORHEXIDINE GLUCONATE 4 % EX LIQD: 60.0000 mL | Freq: Once | CUTANEOUS | Status: DC

## 2019-07-03 MED ORDER — ONDANSETRON HCL 4 MG/2ML IJ SOLN: 4.0000 mg | Freq: Four times a day (QID) | INTRAMUSCULAR | Status: DC | PRN

## 2019-07-03 MED ORDER — SODIUM CHLORIDE 0.9 % IV SOLN
INTRAVENOUS | Status: AC
  Filled 2019-07-03 (×3): qty 1.2

## 2019-07-03 MED ORDER — CHLORHEXIDINE GLUCONATE 0.12 % MT SOLN
15.0000 mL | Freq: Once | OROMUCOSAL | Status: AC
  Administered 2019-07-03: 15 mL via OROMUCOSAL
  Filled 2019-07-03: qty 15

## 2019-07-03 MED ORDER — TEMAZEPAM 15 MG PO CAPS
15.0000 mg | ORAL_CAPSULE | Freq: Every day | ORAL | Status: DC
  Administered 2019-07-03: 15 mg via ORAL
  Filled 2019-07-03: qty 1

## 2019-07-03 MED ORDER — LABETALOL HCL 5 MG/ML IV SOLN: 10.0000 mg | INTRAVENOUS | Status: DC | PRN

## 2019-07-03 MED ORDER — SODIUM CHLORIDE 0.9 % IV SOLN
INTRAVENOUS | Status: DC | PRN
  Administered 2019-07-03: 500 mL

## 2019-07-03 MED ORDER — FENTANYL CITRATE (PF) 250 MCG/5ML IJ SOLN
INTRAMUSCULAR | Status: AC
  Filled 2019-07-03: qty 5

## 2019-07-03 MED ORDER — LIDOCAINE HCL 1 % IJ SOLN
INTRAMUSCULAR | Status: DC | PRN
  Administered 2019-07-03: 20 mL via INTRADERMAL

## 2019-07-03 MED ORDER — PROPOFOL 500 MG/50ML IV EMUL
INTRAVENOUS | Status: DC | PRN
  Administered 2019-07-03: 25 ug/kg/min via INTRAVENOUS

## 2019-07-03 MED ORDER — LIDOCAINE HCL 1 % IJ SOLN
INTRAMUSCULAR | Status: AC
  Filled 2019-07-03: qty 20

## 2019-07-03 MED ORDER — IODIXANOL 320 MG/ML IV SOLN
INTRAVENOUS | Status: DC | PRN
  Administered 2019-07-03: .01 mL

## 2019-07-03 MED ORDER — PROTAMINE SULFATE 10 MG/ML IV SOLN
INTRAVENOUS | Status: AC
  Filled 2019-07-03: qty 25

## 2019-07-03 MED ORDER — LACTATED RINGERS IV SOLN
INTRAVENOUS | Status: DC | PRN
  Administered 2019-07-03: 06:00:00 via INTRAVENOUS

## 2019-07-03 MED ORDER — HEPARIN SODIUM (PORCINE) 1000 UNIT/ML IJ SOLN
INTRAMUSCULAR | Status: DC | PRN
  Administered 2019-07-03: 2000 [IU] via INTRAVENOUS
  Administered 2019-07-03: 13000 [IU] via INTRAVENOUS

## 2019-07-03 MED ORDER — HEPARIN SODIUM (PORCINE) 1000 UNIT/ML IJ SOLN
INTRAMUSCULAR | Status: AC
  Filled 2019-07-03: qty 1

## 2019-07-03 MED ORDER — HYDRALAZINE HCL 20 MG/ML IJ SOLN: 10.0000 mg | INTRAMUSCULAR | Status: DC | PRN

## 2019-07-03 MED ORDER — SODIUM CHLORIDE 0.9 % WEIGHT BASED INFUSION
1.0000 mL/kg/h | INTRAVENOUS | Status: DC
  Administered 2019-07-03: 1 mL/kg/h via INTRAVENOUS

## 2019-07-03 MED ORDER — ACETAMINOPHEN 325 MG PO TABS: 650.0000 mg | ORAL_TABLET | ORAL | Status: DC | PRN

## 2019-07-03 MED ORDER — SODIUM CHLORIDE 0.9 % IV SOLN: INTRAVENOUS | Status: DC

## 2019-07-03 MED ORDER — PANTOPRAZOLE SODIUM 40 MG PO TBEC
40.0000 mg | DELAYED_RELEASE_TABLET | Freq: Every day | ORAL | Status: DC
  Administered 2019-07-04: 06:00:00 40 mg via ORAL
  Filled 2019-07-03: qty 1

## 2019-07-03 MED ORDER — ROSUVASTATIN CALCIUM 5 MG PO TABS
10.0000 mg | ORAL_TABLET | Freq: Every evening | ORAL | Status: DC
  Administered 2019-07-03: 10 mg via ORAL
  Filled 2019-07-03: qty 2

## 2019-07-03 MED ORDER — POTASSIUM CHLORIDE CRYS ER 10 MEQ PO TBCR
10.0000 meq | EXTENDED_RELEASE_TABLET | Freq: Every day | ORAL | Status: DC
  Administered 2019-07-04: 10 meq via ORAL
  Filled 2019-07-03 (×2): qty 1

## 2019-07-03 MED ORDER — OXYCODONE HCL 5 MG PO TABS: 5.0000 mg | ORAL_TABLET | ORAL | Status: DC | PRN

## 2019-07-03 MED ORDER — MIDAZOLAM HCL 2 MG/2ML IJ SOLN
INTRAMUSCULAR | Status: DC | PRN
  Administered 2019-07-03: 2 mg via INTRAVENOUS

## 2019-07-03 MED ORDER — DIAZEPAM 5 MG PO TABS: 5.0000 mg | ORAL_TABLET | Freq: Three times a day (TID) | ORAL | Status: DC | PRN

## 2019-07-03 MED ORDER — LORATADINE 10 MG PO TABS
10.0000 mg | ORAL_TABLET | Freq: Every day | ORAL | Status: DC
  Administered 2019-07-04: 08:00:00 10 mg via ORAL
  Filled 2019-07-03: qty 1

## 2019-07-03 MED ORDER — SOTALOL HCL 80 MG PO TABS
40.0000 mg | ORAL_TABLET | Freq: Two times a day (BID) | ORAL | Status: DC
  Administered 2019-07-03 – 2019-07-04 (×2): 40 mg via ORAL
  Filled 2019-07-03 (×3): qty 0.5

## 2019-07-03 MED ORDER — NOREPINEPHRINE BITARTRATE 1 MG/ML IV SOLN
INTRAVENOUS | Status: DC | PRN
  Administered 2019-07-03: 1 mL via INTRAVENOUS

## 2019-07-03 MED ORDER — SALINE SPRAY 0.65 % NA SOLN
1.0000 | Freq: Three times a day (TID) | NASAL | Status: DC | PRN
  Filled 2019-07-03: qty 44

## 2019-07-03 MED ORDER — PROPOFOL 10 MG/ML IV BOLUS
INTRAVENOUS | Status: AC
  Filled 2019-07-03: qty 20

## 2019-07-03 MED ORDER — TRIAMTERENE-HCTZ 37.5-25 MG PO TABS
1.0000 | ORAL_TABLET | Freq: Every day | ORAL | Status: DC
  Administered 2019-07-04: 1 via ORAL
  Filled 2019-07-03: qty 1

## 2019-07-03 MED ORDER — PROTAMINE SULFATE 10 MG/ML IV SOLN
INTRAVENOUS | Status: DC | PRN
  Administered 2019-07-03: 130 mg via INTRAVENOUS

## 2019-07-03 MED ORDER — MIDAZOLAM HCL 2 MG/2ML IJ SOLN
INTRAMUSCULAR | Status: AC
  Filled 2019-07-03: qty 2

## 2019-07-03 MED ORDER — CHLORHEXIDINE GLUCONATE 4 % EX LIQD: 30.0000 mL | CUTANEOUS | Status: DC

## 2019-07-03 MED ORDER — PROPOFOL 1000 MG/100ML IV EMUL
INTRAVENOUS | Status: AC
  Filled 2019-07-03: qty 100

## 2019-07-03 MED ORDER — SODIUM CHLORIDE 0.9% FLUSH: 3.0000 mL | INTRAVENOUS | Status: DC | PRN

## 2019-07-03 SURGICAL SUPPLY — 95 items
BAG DECANTER FOR FLEXI CONT (MISCELLANEOUS) IMPLANT
BAG SNAP BAND KOVER 36X36 (MISCELLANEOUS) ×4 IMPLANT
BLADE CLIPPER SURG (BLADE) IMPLANT
BLADE STERNUM SYSTEM 6 (BLADE) IMPLANT
BLADE SURG 10 STRL SS (BLADE) IMPLANT
CABLE ADAPT CONN TEMP 6FT (ADAPTER) ×4 IMPLANT
CANISTER SUCT 3000ML PPV (MISCELLANEOUS) IMPLANT
CATH DIAG EXPO 6F AL1 (CATHETERS) ×2 IMPLANT
CATH DIAG EXPO 6F FR4 (CATHETERS) ×2 IMPLANT
CATH DIAG EXPO 6F VENT PIG 145 (CATHETERS) ×6 IMPLANT
CATH EXTERNAL FEMALE PUREWICK (CATHETERS) IMPLANT
CATH INFINITI 6F AL2 (CATHETERS) IMPLANT
CATH INFINITI 6F AR2 (CATHETERS) ×2 IMPLANT
CATH INFINITI 6F RCB (CATHETERS) ×2 IMPLANT
CATH LAUNCHER 6FR AL.75 (CATHETERS) ×2 IMPLANT
CATH LAUNCHER 6FR AL2 (CATHETERS) IMPLANT
CATH LAUNCHER 6FR AL3 (CATHETERS) ×2 IMPLANT
CATH S G BIP PACING (CATHETERS) ×4 IMPLANT
CATH SUPER TORQUE PLUS 6F MPA1 (CATHETERS) ×2 IMPLANT
CATHETER LAUNCHER 6FR AL2 (CATHETERS) ×4
CHLORAPREP W/TINT 26 (MISCELLANEOUS) ×4 IMPLANT
CLIP VESOCCLUDE MED 24/CT (CLIP) IMPLANT
CLIP VESOCCLUDE SM WIDE 24/CT (CLIP) IMPLANT
CLOSURE MYNX CONTROL 6F/7F (Vascular Products) ×2 IMPLANT
CONT SPEC 4OZ CLIKSEAL STRL BL (MISCELLANEOUS) ×8 IMPLANT
COVER BACK TABLE 80X110 HD (DRAPES) IMPLANT
COVER WAND RF STERILE (DRAPES) ×4 IMPLANT
DECANTER SPIKE VIAL GLASS SM (MISCELLANEOUS) ×4 IMPLANT
DERMABOND ADVANCED (GAUZE/BANDAGES/DRESSINGS) ×2
DERMABOND ADVANCED .7 DNX12 (GAUZE/BANDAGES/DRESSINGS) ×2 IMPLANT
DEVICE CLOSURE PERCLS PRGLD 6F (VASCULAR PRODUCTS) ×4 IMPLANT
DEVICE TORQUE KENDALL .025-038 (MISCELLANEOUS) ×2 IMPLANT
DRAPE INCISE IOBAN 66X45 STRL (DRAPES) IMPLANT
DRSG TEGADERM 4X4.75 (GAUZE/BANDAGES/DRESSINGS) ×6 IMPLANT
ELECT CAUTERY BLADE 6.4 (BLADE) IMPLANT
ELECT REM PT RETURN 9FT ADLT (ELECTROSURGICAL) ×8
ELECTRODE REM PT RTRN 9FT ADLT (ELECTROSURGICAL) ×4 IMPLANT
FELT TEFLON 6X6 (MISCELLANEOUS) ×4 IMPLANT
GAUZE SPONGE 4X4 12PLY STRL (GAUZE/BANDAGES/DRESSINGS) ×4 IMPLANT
GLIDEWIRE ANGLED SS 035X260CM (WIRE) ×2 IMPLANT
GLOVE BIO SURGEON STRL SZ7.5 (GLOVE) IMPLANT
GLOVE BIO SURGEON STRL SZ8 (GLOVE) IMPLANT
GLOVE EUDERMIC 7 POWDERFREE (GLOVE) IMPLANT
GLOVE ORTHO TXT STRL SZ7.5 (GLOVE) IMPLANT
GOWN STRL REUS W/ TWL LRG LVL3 (GOWN DISPOSABLE) IMPLANT
GOWN STRL REUS W/ TWL XL LVL3 (GOWN DISPOSABLE) ×2 IMPLANT
GOWN STRL REUS W/TWL LRG LVL3 (GOWN DISPOSABLE)
GOWN STRL REUS W/TWL XL LVL3 (GOWN DISPOSABLE) ×2
GUIDEWIRE ANGLED .035X260CM (WIRE) ×2 IMPLANT
GUIDEWIRE SAF TJ AMPL .035X180 (WIRE) ×4 IMPLANT
GUIDEWIRE SAFE TJ AMPLATZ EXST (WIRE) ×4 IMPLANT
INSERT FOGARTY SM (MISCELLANEOUS) IMPLANT
KIT BASIN OR (CUSTOM PROCEDURE TRAY) ×4 IMPLANT
KIT ESSENTIALS PG (KITS) ×2 IMPLANT
KIT HEART LEFT (KITS) ×4 IMPLANT
KIT SUCTION CATH 14FR (SUCTIONS) IMPLANT
KIT TURNOVER KIT B (KITS) ×4 IMPLANT
LOOP VESSEL MAXI BLUE (MISCELLANEOUS) IMPLANT
LOOP VESSEL MINI RED (MISCELLANEOUS) IMPLANT
NS IRRIG 1000ML POUR BTL (IV SOLUTION) ×4 IMPLANT
PACK ENDO MINOR (CUSTOM PROCEDURE TRAY) ×4 IMPLANT
PAD ARMBOARD 7.5X6 YLW CONV (MISCELLANEOUS) ×8 IMPLANT
PAD ELECT DEFIB RADIOL ZOLL (MISCELLANEOUS) ×4 IMPLANT
PENCIL BUTTON HOLSTER BLD 10FT (ELECTRODE) IMPLANT
PERCLOSE PROGLIDE 6F (VASCULAR PRODUCTS) ×8
POSITIONER HEAD DONUT 9IN (MISCELLANEOUS) ×4 IMPLANT
SET MICROPUNCTURE 5F STIFF (MISCELLANEOUS) ×4 IMPLANT
SHEATH BRITE TIP 7FR 35CM (SHEATH) ×4 IMPLANT
SHEATH PINNACLE 6F 10CM (SHEATH) ×4 IMPLANT
SHEATH PINNACLE 8F 10CM (SHEATH) ×4 IMPLANT
SLEEVE REPOSITIONING LENGTH 30 (MISCELLANEOUS) ×4 IMPLANT
STOPCOCK MORSE 400PSI 3WAY (MISCELLANEOUS) ×8 IMPLANT
SUT ETHIBOND X763 2 0 SH 1 (SUTURE) IMPLANT
SUT GORETEX CV 4 TH 22 36 (SUTURE) IMPLANT
SUT GORETEX CV4 TH-18 (SUTURE) IMPLANT
SUT MNCRL AB 3-0 PS2 18 (SUTURE) IMPLANT
SUT PROLENE 5 0 C 1 36 (SUTURE) IMPLANT
SUT PROLENE 6 0 C 1 30 (SUTURE) IMPLANT
SUT SILK  1 MH (SUTURE) ×2
SUT SILK 1 MH (SUTURE) ×2 IMPLANT
SUT VIC AB 2-0 CT1 27 (SUTURE)
SUT VIC AB 2-0 CT1 TAPERPNT 27 (SUTURE) IMPLANT
SUT VIC AB 2-0 CTX 36 (SUTURE) IMPLANT
SUT VIC AB 3-0 SH 8-18 (SUTURE) IMPLANT
SYR 50ML LL SCALE MARK (SYRINGE) ×4 IMPLANT
SYR BULB IRRIGATION 50ML (SYRINGE) IMPLANT
SYR MEDRAD MARK V 150ML (SYRINGE) ×4 IMPLANT
TOWEL GREEN STERILE (TOWEL DISPOSABLE) ×8 IMPLANT
TRANSDUCER W/STOPCOCK (MISCELLANEOUS) ×8 IMPLANT
TRAY FOLEY SLVR 14FR TEMP STAT (SET/KITS/TRAYS/PACK) IMPLANT
TUBE SUCT INTRACARD DLP 20F (MISCELLANEOUS) IMPLANT
VALVE 26 ULTRA SAPIEN KIT (Valve) IMPLANT
WIRE EMERALD 3MM-J .035X150CM (WIRE) ×4 IMPLANT
WIRE EMERALD 3MM-J .035X260CM (WIRE) ×4 IMPLANT
WIRE EMERALD ST .035X260CM (WIRE) ×4 IMPLANT

## 2019-07-03 NOTE — Transfer of Care (Signed)
Immediate Anesthesia Transfer of Care Note  Patient: Randall Edwards  Procedure(s) Performed: attempted TRANSCATHETER AORTIC VALVE REPLACEMENT, TRANSFEMORAL (N/A Chest) TRANSESOPHAGEAL ECHOCARDIOGRAM (TEE) (N/A )  Patient Location: Cath Lab  Anesthesia Type:MAC  Level of Consciousness: lethargic and responds to stimulation  Airway & Oxygen Therapy: Patient Spontanous Breathing and Patient connected to nasal cannula oxygen  Post-op Assessment: Report given to RN  Post vital signs: Reviewed and stable  Last Vitals:  Vitals Value Taken Time  BP    Temp    Pulse    Resp    SpO2      Last Pain:  Vitals:   07/03/19 0655  TempSrc: Oral  PainSc:          Complications: No apparent anesthesia complications

## 2019-07-03 NOTE — Anesthesia Procedure Notes (Signed)
Arterial Line Insertion Start/End10/20/2020 7:10 AM, 07/03/2019 7:35 AM Performed by: Imagene Riches, CRNA, CRNA  Patient location: Pre-op. Preanesthetic checklist: patient identified, risks and benefits discussed and pre-op evaluation Right, radial was placed Catheter size: 20 G Hand hygiene performed  and maximum sterile barriers used  Allen's test indicative of satisfactory collateral circulation Attempts: 2 Procedure performed without using ultrasound guided technique. Following insertion, dressing applied and Biopatch. Post procedure assessment: normal  Patient tolerated the procedure well with no immediate complications.

## 2019-07-03 NOTE — Progress Notes (Signed)
  Echocardiogram 2D Echocardiogram has been performed.  Bobbye Charleston 07/03/2019, 11:03 AM

## 2019-07-03 NOTE — Op Note (Signed)
[facility]   MULTIDISCIPLINARY HEART VALVE TEAM   TAVR OPERATIVE NOTE   Date of Procedure:  07/03/2019  Preoperative Diagnosis: Severe Aortic Stenosis   Postoperative Diagnosis: Same   Procedure:    Transcatheter Aortic Valve Replacement - Percutaneous Transfemoral Approach  **Valve NOT Implanted**   Co-Surgeons:  Gaye Pollack, MD and Sherren Mocha, MD  Anesthesiologist:  Hoy Morn, MD  Echocardiographer:  Jenkins Rouge, MD  Pre-operative Echo Findings:  Severe bioprosthetic aortic stenosis  Normal left ventricular systolic function  BRIEF CLINICAL NOTE AND INDICATIONS FOR SURGERY  73 year old gentleman with history of aortic valve disease status post bioprosthetic Bentall procedure in 2010 with a 25 mm Edwards magna ease pericardial valve inside a Gelweave Valsalva graft.  The patient has developed severe bioprosthetic aortic stenosis with peak and mean transvalvular gradients of 60 and 37 mmHg, respectively.  His mean gradient increased from 22 mmHg just 1 year ago.  LVEF is preserved at 60 to 65%.  The patient has developed New York Heart Association functional class II symptoms of exercise intolerance and fatigue.  He has been evaluated by our multidisciplinary heart valve team and felt to be an appropriate candidate for valve in valve TAVR.  Preoperative evaluation has included cardiac catheterization, echo studies, and CT angiogram studies.  His anatomy is suitable for valve in valve TAVR with acceptable coronary heights.  We planned on using a 26 mm SAPIEN 3 ultra valve.  During the course of the patient's preoperative work up they have been evaluated comprehensively by a multidisciplinary team of specialists coordinated through the [facility] in [facility].  They have been demonstrated to suffer from symptomatic severe aortic stenosis as noted above. The patient has been counseled extensively as  to the relative risks and benefits of all options for the treatment of severe aortic stenosis including long term medical therapy, conventional surgery for aortic valve replacement, and transcatheter aortic valve replacement.  The patient has been independently evaluated in formal cardiac surgical consultation by Dr Cyndia Bent, who deemed the patient appropriate for TAVR. Based upon review of all of the patient's preoperative diagnostic tests they are felt to be candidate for transcatheter aortic valve replacement using the transfemoral approach as an alternative to conventional surgery.    Following the decision to proceed with transcatheter aortic valve replacement, a discussion has been held regarding what types of management strategies would be attempted intraoperatively in the event of life-threatening complications, including whether or not the patient would be considered a candidate for the use of cardiopulmonary bypass and/or conversion to open sternotomy for attempted surgical intervention.  The patient has been advised of a variety of complications that might develop peculiar to this approach including but not limited to risks of death, stroke, paravalvular leak, aortic dissection or other major vascular complications, aortic annulus rupture, device embolization, cardiac rupture or perforation, acute myocardial infarction, arrhythmia, heart block or bradycardia requiring permanent pacemaker placement, congestive heart failure, respiratory failure, renal failure, pneumonia, infection, other late complications related to structural valve deterioration or migration, or other complications that might ultimately cause a temporary or permanent loss of functional independence or other long term morbidity.  The patient provides full informed consent for the procedure as described and all questions were answered preoperatively.  DETAILS OF THE OPERATIVE PROCEDURE  PREPARATION:   The patient is brought to the  operating room on the above mentioned date and central monitoring was established by the anesthesia  team including placement of a central venous catheter and radial arterial line. The patient is placed in the supine position on the operating table.  Intravenous antibiotics are administered. The patient is monitored closely throughout the procedure under conscious sedation.  Baseline transthoracic echocardiogram is performed. The patient's chest, abdomen, both groins, and both lower extremities are prepared and draped in a sterile manner. A time out procedure is performed.   PERIPHERAL ACCESS:   Using ultrasound guidance, femoral venous access is obtained with placement of a 6 Fr sheath on the left side. A temporary transvenous pacemaker catheter was passed through the femoral venous sheath under fluoroscopic guidance into the right ventricle.  The pacemaker was tested to ensure stable lead placement and pacemaker capture.   TRANSFEMORAL ACCESS:  A micropuncture technique is used to access the right femoral artery under fluoroscopic and ultrasound guidance.  2 Perclose devices are deployed at 10' and 2' positions to 'PreClose' the femoral artery. An 8 French sheath is placed and then an Amplatz Superstiff wire is advanced through the sheath. This is changed out for a 14 French transfemoral E-Sheath after progressively dilating over the Superstiff wire.  Initially, an AL 2 catheter was used to direct a straight-tip exchange length wire across the native aortic valve.  After a prolonged effort, a wire would not cross the aortic valve.  The catheter is changed out to an AL-1 and another prolonged attempt is made at crossing the bioprosthetic aortic valve, also without success.  Multiple fluoroscopic angles were used to try to better orient the catheter and wire direction.  A variety of catheters were then used after the long attempts with the AL-1 an AL 2 catheters.  Catheters used included a JR4, RCB,  multipurpose catheter, followed by repeated attempts with the AL catheters.  An AR-2 catheter is also used.  None of these catheters successfully directed a wire across the aortic valve.  We then tried to use an angled Glidewire through some of the same catheters but this also would not cross.  Both a standard angled Glidewire and a stiff angled Glidewire were used.  We then changed out to coronary guide catheters and tried AL 3, AL 2, and AL 0.75 guide catheters.  None of these catheters were successful at directing the wire across the valve.  Again, multiple fluoroscopic angles were evaluated and it appeared the wire was appropriately directed toward the bioprosthesis.  At this point the patient had a prolonged fluoroscopic time and really no other options for crossing the aortic valve in retrograde fashion from a transfemoral approach.  We elected to abort the procedure with plans to bring him back for a transapical approach.    PROCEDURE COMPLETION:  The sheath was removed and femoral artery closure is performed using the 2 previously deployed Perclose devices.  Protamine is administered once femoral arterial repair was complete. The site is clear with no evidence of bleeding or hematoma after the sutures are tightened. The temporary pacemaker and pigtail catheters are removed. Mynx closure is used for femoral venous hemostasis. The patient tolerated the procedure well and is transported to the surgical intensive care in stable condition. There were no immediate intraoperative complications. All sponge instrument and needle counts are verified correct at completion of the operation.   The patient received a total of 0 mL of intravenous contrast during the procedure.   Sherren Mocha, MD 07/03/2019 11:11 AM

## 2019-07-03 NOTE — Interval H&P Note (Signed)
History and Physical Interval Note:  07/03/2019 7:05 AM  [name]  has presented today for surgery, with the diagnosis of Severe Aortic Stenosis.  The various methods of treatment have been discussed with the patient and family. After consideration of risks, benefits and other options for treatment, the patient has consented to  Procedure(s): TRANSCATHETER AORTIC VALVE REPLACEMENT, TRANSFEMORAL (N/A) TRANSESOPHAGEAL ECHOCARDIOGRAM (TEE) (N/A) as a surgical intervention.  The patient's history has been reviewed, patient examined, no change in status, stable for surgery.  I have reviewed the patient's chart and labs.  Questions were answered to the patient's satisfaction.     Gaye Pollack

## 2019-07-03 NOTE — Progress Notes (Signed)
Patient ID: LD EPLIN, male   DOB: May 08, 1946, 73 y.o.   MRN: [id number] TCTS   Hemodynamically stable in sinus rhythm.  Feels fine  Groin sites look good.  I discussed the procedure with him and inability to cross the valve with a wire to complete TAVR. He says that he understands and wants to proceed with transapical TAVR as soon as possible. Will plan to do it in two weeks. He can go home in the morning if no changes.

## 2019-07-03 NOTE — Anesthesia Procedure Notes (Signed)
Procedure Name: MAC Date/Time: 07/03/2019 7:55 AM Performed by: Barrington Ellison, CRNA Pre-anesthesia Checklist: Patient identified, Emergency Drugs available, Suction available and Patient being monitored Patient Re-evaluated:Patient Re-evaluated prior to induction Oxygen Delivery Method: Simple face mask

## 2019-07-03 NOTE — Progress Notes (Signed)
Pt arrived to 4e from cath lab. Pt oriented to room and staff. Pt drowsy. CHG bath completed. Vitals obtained. Pt temp 95 degrees, rectally. HR 35-40. PA notified. Will continue to monitor.

## 2019-07-03 NOTE — Progress Notes (Signed)
R radial arterial line was pulled, and manual pressure was held for 8 min. R wrist is level zero and R radial pulse is palpable at 2+.

## 2019-07-03 NOTE — Op Note (Signed)
[facility]   MULTIDISCIPLINARY HEART VALVE TEAM   TAVR OPERATIVE NOTE   Date of Procedure:  07/03/2019  Preoperative Diagnosis: Severe Aortic Stenosis   Postoperative Diagnosis: Same   Procedure:    Transcatheter Aortic Valve Replacement - Percutaneous Right Transfemoral Approach  VALVE NOT IMPLANTED   Co-Surgeons:  Gaye Pollack, MD and Sherren Mocha, MD    Anesthesiologist:  Everardo Beals, MD  Echocardiographer:  Jenkins Rouge, MD  Pre-operative Echo Findings:  Severe bioprosthetic aortic stenosis  Normal left ventricular systolic function    BRIEF CLINICAL NOTE AND INDICATIONS FOR SURGERY  This 73 year old gentleman has stage D, severe, symptomatic prosthetic aortic stenosis New York Heart Association class II symptoms to moderate physical activity consistent with chronic diastolic congestive heart failure. Says that he feels fairly well overall and was 145 units very tired at the end of the day and has to stop frequently when doing physical activity. His aerobic physical activity is somewhat limited by his arthritis although he continues to be active doing things around the house and yard. I have personally reviewed his 2D echocardiogram, cardiac catheterization, and CTA studies. He has a 25 mm pericardial aortic valve prosthesis that is thickened with reduced leaflet mobility. The mean gradient has increased from 22 mmHg 1 year ago to 36.5 mmHg now. Left ventricular systolic function is normal. Cardiac catheterization shows no coronary disease. I agree that it is probably time to proceed with aortic valve replacement given the degree of progression of the past year. I think he will probably become progressively more symptomatic over the next 6 months if this is not treated. I think the operative risk for open surgical replacement morbidity significantly higher than that predicted by the STS risk calculator since he had a composite bioprosthesis/graft Bentall  procedure. This would require redo root replacement. I think valve in valve TAVR would be the best option for treating this patient. Is 25 mm Edwards Magna-Ease pericardial valve can be replaced with 26 mm Sapien 3 valve. His gated cardiac CTA shows anatomy suitable for valve in valve TAVR. His abdominal and pelvic CTA shows adequate pelvic vascular anatomy to allow transfemoral insertion.  The patient and his wife were counseled at length regarding treatment alternatives for management of severe symptomatic aortic stenosis. The risks and benefits of surgical intervention has been discussed in detail. Long-term prognosis with medical therapy was discussed. Alternative approaches such as conventional surgical aortic valve replacement, transcatheter aortic valve replacement, and palliative medical therapy were compared and contrasted at length. This discussion was placed in the context of the patient's own specific clinical presentation and past medical history. All of their questions have been addressed.  Following the decision to proceed with transcatheter aortic valve replacement, a discussion was held regarding what types of management strategies would be attempted intraoperatively in the event of life-threatening complications, including whether or not the patient would be considered a candidate for the use of cardiopulmonary bypass and/or conversion to open sternotomy for attempted surgical intervention. I do not think he would be a candidate for emergent redo sternotomy to manage any intraoperative complications. The patient has been advised of a variety of complications that might develop including but not limited to risks of death, stroke, paravalvular leak, aortic dissection or other major vascular complications, aortic annulus rupture, device embolization, cardiac rupture or perforation, mitral regurgitation, acute myocardial infarction, arrhythmia, heart block or bradycardia requiring permanent pacemaker  placement, congestive heart failure, respiratory failure, renal failure, pneumonia, infection, other late  complications related to structural valve deterioration or migration, or other complications that might ultimately cause a temporary or permanent loss of functional independence or other long term morbidity. The patient provides full informed consent for the procedure as described and all questions were answered.     DETAILS OF THE OPERATIVE PROCEDURE  PREPARATION:    The patient is brought to the operating room on the above mentioned date and appropriate monitoring was established by the anesthesia team. The patient is placed in the supine position on the operating table.  Intravenous antibiotics are administered. The patient is monitored closely throughout the procedure under conscious sedation. Baseline transthoracic echocardiogram was performed. The patient's abdomen and both groins are prepared and draped in a sterile manner. A time out procedure is performed.   PERIPHERAL ACCESS:    Using the modified Seldinger technique, femoral  venous access was obtained with placement of a 6 Fr sheath on the left side.  A temporary transvenous pacemaker catheter was passed through the left femoral venous sheath under fluoroscopic guidance into the right ventricle.  The pacemaker was tested to ensure stable lead placement and pacemaker capture. Aortic root angiography was performed in order to determine the optimal angiographic angle for valve deployment.   TRANSFEMORAL ACCESS:   Percutaneous transfemoral access and sheath placement was performed using ultrasound guidance.  The right common femoral artery was cannulated using a micropuncture needle and appropriate location was verified using hand injection angiogram.  A pair of Abbott Perclose percutaneous closure devices were placed and a 6 French sheath replaced into the femoral artery.  The patient was heparinized systemically and ACT verified >  250 seconds.    A 14 Fr transfemoral E-sheath was introduced into the right common femoral artery after progressively dilating over an Amplatz superstiff wire. Initially an AL-2 catheter was used to direct a straight-tip exchange length wire across the native aortic valve into the left ventricle. After a prolonged effort a wire could not be passed across the valve. This was changed out to an AL-1 catheter and after another prolonged effort a wire would still not cross the valve. We tried multiple other catheters including JR4, RCB, MPC, and AR-2 catheters. We still could not get a wire to cross the valve. We then tried an angled Glidewire and a stiff angled Glidewire through some of the same catheters unsuccessfully. We then tried coronary guide catheters including AL-3, AL-2, and AL-0.75 but were not successful. We tried pacing at a higher rate to increase the amount of time that the valve was open. Since we had a prolonged fluoroscopic time at this point and no other good options for crossing the valve antegrade we decided to about the procedure with plans to reschedule him for transapical insertion after we had a change to properly discuss it with him and his wife.   PROCEDURE COMPLETION:   The sheath was removed and femoral artery closure performed using the previously placed Perclose devices.  Protamine was administered once femoral arterial repair was complete. The temporary pacemaker and left femoral venous sheath removed with manual pressure used for hemostasis.    The patient tolerated the procedure well and is transported to the cath lab recovery area in stable condition. There were no immediate intraoperative complications. All sponge instrument and needle counts are verified correct at completion of the operation.   No blood products were administered during the operation.  The patient received a total of 0 mL of intravenous contrast during the procedure.  Gaye Pollack,  MD 07/03/2019

## 2019-07-04 ENCOUNTER — Encounter (HOSPITAL_COMMUNITY): Payer: Self-pay | Admitting: Cardiovascular Disease

## 2019-07-04 ENCOUNTER — Other Ambulatory Visit: Payer: Self-pay

## 2019-07-04 DIAGNOSIS — Z954 Presence of other heart-valve replacement: Secondary | ICD-10-CM

## 2019-07-04 DIAGNOSIS — I350 Nonrheumatic aortic (valve) stenosis: Secondary | ICD-10-CM

## 2019-07-04 MED ORDER — POTASSIUM CHLORIDE ER 10 MEQ PO TBCR: 10.0000 meq | EXTENDED_RELEASE_TABLET | ORAL | 6 refills | Status: DC

## 2019-07-04 MED FILL — Potassium Chloride Inj 2 mEq/ML: INTRAVENOUS | Qty: 40 | Status: AC

## 2019-07-04 MED FILL — Heparin Sodium (Porcine) Inj 1000 Unit/ML: INTRAMUSCULAR | Qty: 30 | Status: AC

## 2019-07-04 MED FILL — Magnesium Sulfate Inj 50%: INTRAMUSCULAR | Qty: 10 | Status: AC

## 2019-07-04 NOTE — Progress Notes (Signed)
Patient given discharge instructions medication list and prescriptions sent to personal pharmacy. Patient given follow up appointments IV and tele dcd. Will discharge home as ordered. Transported to exit with nursing staff and wheel chair. Tiffinie Caillier, Bettina Gavia RN

## 2019-07-04 NOTE — Anesthesia Postprocedure Evaluation (Signed)
Anesthesia Post Note  Patient: EMIEL KIELTY  Procedure(s) Performed: attempted TRANSCATHETER AORTIC VALVE REPLACEMENT, TRANSFEMORAL (N/A Chest) TRANSESOPHAGEAL ECHOCARDIOGRAM (TEE) (N/A )     Patient location during evaluation: Cath Lab Anesthesia Type: MAC Level of consciousness: awake and alert Pain management: pain level controlled Vital Signs Assessment: post-procedure vital signs reviewed and stable Respiratory status: spontaneous breathing, nonlabored ventilation, respiratory function stable and patient connected to nasal cannula oxygen Cardiovascular status: stable and blood pressure returned to baseline Postop Assessment: no apparent nausea or vomiting Anesthetic complications: no    Last Vitals:  Vitals:   07/04/19 0450 07/04/19 0814  BP: 126/79 123/85  Pulse: 65 80  Resp: (!) 21 17  Temp: 36.6 C   SpO2: 94% 92%    Last Pain:  Vitals:   07/04/19 0823  TempSrc:   PainSc: 0-No pain                 Catalina Gravel

## 2019-07-04 NOTE — Discharge Instructions (Signed)
°  You are scheduled for Pre Admission Testing onFriday, 10/30/2020at 1pm. Please arrive in Admitting at [facility] ([place] [address]) at 12:45AM for check-in. No restrictions for this appointment.No one is allowed in with the patient for this visit due to Covid 19 restrictions. A support person can be conferenced in over the phone.   You are scheduled for drive thru Covid Swabbing (pre procedure lane)on Friday, 07/13/19 at 2:10 PMat the [facility] ([facility] [address]) [address]. After this appointment you will need to go home and quarantine until you return for surgery. You can attend medical appointments.   Tuesday, Nov 3rd, 2020 -TAVR:    Please arrive in Admitting at [facility](Main Entrance A) at 5:30AM for check-in.   Have nothing to eat or drink after midnight the night before surgery.  Continue taking all current medications without change through the day before surgery. On the morning of surgery do not take any medications.  If you have any questions or concerns, please don't hesitate to call.  Sincerely,  Angelena Form, PA-C _____________  Groin Site Care Refer to this sheet in the next few weeks. These instructions provide you with information on caring for yourself after your procedure. Your caregiver may also give you more specific instructions. Your treatment has been planned according to current medical practices, but problems sometimes occur. Call your caregiver if you have any problems or questions after your procedure. HOME CARE INSTRUCTIONS  You may shower 24 hours after the procedure. Remove the bandage (dressing) and gently wash the site with plain soap and water. Gently pat the site dry.   Do not apply powder or lotion to the site.   Do not sit in a bathtub, swimming pool, or whirlpool for 5 to 7 days.   No bending, squatting, or lifting anything over 10 pounds (4.5 kg) as directed by your  caregiver.   Inspect the site at least twice daily.   Do not drive home if you are discharged the same day of the procedure. Have someone else drive you.   You may drive 24 hours after the procedure unless otherwise instructed by your caregiver.  What to expect:  Any bruising will usually fade within 1 to 2 weeks.   Blood that collects in the tissue (hematoma) may be painful to the touch. It should usually decrease in size and tenderness within 1 to 2 weeks.  SEEK IMMEDIATE MEDICAL CARE IF:  You have unusual pain at the groin site or down the affected leg.   You have redness, warmth, swelling, or pain at the groin site.   You have drainage (other than a small amount of blood on the dressing).   You have chills.   You have a fever or persistent symptoms for more than 72 hours.   You have a fever and your symptoms suddenly get worse.   Your leg becomes pale, cool, tingly, or numb.  You have heavy bleeding from the site. Hold pressure on the site. Marland Kitchen

## 2019-07-04 NOTE — Discharge Summary (Addendum)
[name]  Discharge Summary    Patient ID: Randall Edwards MRN: [id number]; DOB: 02/02/1946  Admit date: 07/03/2019 Discharge date: 07/04/2019  Primary Care Provider: Nicoletta Dress, MD  Primary Cardiologist: Dr. Geraldo Pitter   Discharge Diagnoses    Principal Problem:   Severe aortic stenosis Active Problems:   Dyslipidemia   Essential hypertension   Atrial tachycardia (HCC)   S/P AVR (aortic valve replacement)   Status post radiofrequency ablation for arrhythmia   Ascending aorta dilatation (HCC)   Allergies No Known Allergies  Diagnostic Studies/Procedures    TAVR OPERATIVE NOTE   Date of Procedure:                07/03/2019  Preoperative Diagnosis:      Severe Aortic Stenosis   Postoperative Diagnosis:    Same   Procedure:        Transcatheter Aortic Valve Replacement - Percutaneous Right Transfemoral Approach             VALVE NOT IMPLANTED              Co-Surgeons:                        Gaye Pollack, MD and Sherren Mocha, MD    Anesthesiologist:                  Everardo Beals, MD  Echocardiographer:              Jenkins Rouge, MD  Pre-operative Echo Findings: ? Severe bioprosthetic aortic stenosis ? Normal left ventricular systolic function _____________   History of Present Illness     Randall Edwards is a 73 y.o. male with a history of HTN, HLD, s/p bioprosthetic Bentall procedure in 2010 by Dr. Cyndia Bent, scar related focal atrial tachycardia (some places documented as afib) s/p ablation by Dr. Minna Merritts on Sotalol (not on [facility]) and severe bioprosthetic AS who presented to [facility] on 07/03/19 for planned TAVR.   He had a 25 mm Edwards Magna-Ease pericardial valve inside a Gelweave Valsalva graft. The valve model was 3300 TFX.  He has done well since that surgery now presents with a couple month history of exertional fatigue such as doing yard work.  His wife said that he has to rest  frequently and is tired at the end of the day. He has a history of atrial fibrillation/tachyardia and underwent ablation in 2017. He continues to take sotalol for rhythm control. He has had periodic echocardiograms to follow-up on his prosthetic valve. His most recent echocardiogram 05/14/2011 showed an increase in the mean gradient to 36.5 mmHg with a peak gradient of 60 mm.  The dimensionless index of 0.26. Left ventricular ejection fraction was 60 to 65%.  His mean aortic valve gradient 1 year ago was 22 mmHg.  The patient has been evaluated by the multidisciplinary valve team and felt to have severe, severe bioprosthetic aortic stenosis and to be a suitable candidate for valve in valve TAVR, which was set up for 07/03/19.   Hospital Course     Consultants: none  Unfortunately, Dr. Burt Knack and Dr. Cyndia Bent were not able to cross the valve with a wire to complete the valve in valve TAVR. Catheters were withdrawn and he was kept overnight for observation. Groin sites are healing well. He was initially bradycardic with HRs in the 30-40s but now HRs back in 90s. He has had  a few brief runs of PAT, but otherwise maintaining NSR. Plan to bring him back in two weeks for TAVR via the TA approach on 07/17/19. Consent has been obtained. He will resume all home medications at discharge and return 10/30 for PAT and covid screening. Of note, pre admission labs showed a mildly decreased potassium level at 3.3. He was started on a supplementation with Kdur 40meq daily. I will decrease this to every other day and we will recheck labs next week at PAT.  _____________  Discharge Vitals Blood pressure 123/85, pulse 80, temperature 97.8 F (36.6 C), temperature source Oral, resp. rate 17, height 5\' 9"  (1.753 m), weight 85 kg, SpO2 92 %.  Filed Weights   07/03/19 0602  Weight: 85 kg    Labs & Radiologic Studies    CBC Recent Labs    07/03/19 1004 07/03/19 1127  HGB 14.3 14.3  HCT 42.0 [id number]   Basic Metabolic  Panel Recent Labs    07/03/19 1004 07/03/19 1127  NA 144 144  K 3.8 3.9  CL 104 106  GLUCOSE 135* 116*  BUN 9 8  CREATININE 0.70 0.80   Liver Function Tests No results for input(s): AST, ALT, ALKPHOS, BILITOT, PROT, ALBUMIN in the last 72 hours. No results for input(s): LIPASE, AMYLASE in the last 72 hours. Cardiac Enzymes No results for input(s): CKTOTAL, CKMB, CKMBINDEX, TROPONINI in the last 72 hours. BNP Invalid input(s): POCBNP D-Dimer No results for input(s): DDIMER in the last 72 hours. Hemoglobin A1C No results for input(s): HGBA1C in the last 72 hours. Fasting Lipid Panel No results for input(s): CHOL, HDL, LDLCALC, TRIG, CHOLHDL, LDLDIRECT in the last 72 hours. Thyroid Function Tests No results for input(s): TSH, T4TOTAL, T3FREE, THYROIDAB in the last 72 hours.  Invalid input(s): FREET3 _____________  Dg Chest 2 View  Result Date: 06/29/2019 CLINICAL DATA:  73 year old male under preoperative evaluation prior to aortic valve replacement. EXAM: CHEST - 2 VIEW COMPARISON:  Chest x-ray 05/05/2017. FINDINGS: Lung volumes are normal. No consolidative airspace disease. No pleural effusions. No pneumothorax. No pulmonary nodule or mass noted. Pulmonary vasculature and the cardiomediastinal silhouette are within normal limits. Atherosclerosis in the thoracic aorta. Status post median sternotomy for mitral valve replacement. IMPRESSION: 1. No radiographic evidence of acute cardiopulmonary disease. 2. Aortic atherosclerosis. Electronically Signed   By: Vinnie Langton M.D.   On: 06/29/2019 15:46   Ct Coronary Morph W/cta Cor W/score W/ca W/cm &/or Wo/cm  Addendum Date: 06/12/2019   ADDENDUM REPORT: 06/12/2019 11:26 CLINICAL DATA:  Aortic stenosis EXAM: Cardiac TAVR CT TECHNIQUE: The patient was scanned on a Siemens Force [id number] slice scanner. A 120 kV retrospective scan was triggered in the descending thoracic aorta at 111 HU's. Gantry rotation speed was 270 msecs and collimation  was .9 mm. No beta blockade or nitro were given. The 3D data set was reconstructed in 5% intervals of the R-R cycle. Systolic and diastolic phases were analyzed on a dedicated work station using MPR, MIP and VRT modes. The patient received 80 cc of contrast. FINDINGS: Aortic Valve: Patient has had a Bentall procedure with AVR and root replacement Stented pericardial tissue valve 25 mm Edwards MagnaEase. Thickened leaflets with restricted motion. Annulus intact ID measures 24 mm consistent with described valve replacement. Aorta: Dilated and angulated ascending root. Suture line noted for Bentall bovine arch with moderate calcific atherosclerosis Ascending Thoracic Aorta: 3.9 cm Aortic Arch: 3.1 cm Descending Thoracic Aorta: 2.9 cm Sinus of Valsalva Measurements: Non-coronary: 38.6 mm Right -  coronary: 38.2 mm Left - coronary: 38 mm Coronary Artery Height above Annulus: Left Main: 19.9 mm above annulus and above valve strut. SA 11 mm away from virtual valve at ostium Right Coronary: 16.1 mm above annulus and near top edge of valve strut SA 12.6 mm away from virtual valve ostium IMPRESSION: 1. 25 mm Edwards stented pericardial tissue valve with ID 24 mm MagnaEase. Thickened leaflets with restricted motion. Intact annulus 2. Angulated and dilated aortic root 3.9 cm Appears to have had Bentall with root replacement Bovine Arch 3. Coronary arteries appear to have sufficient height above valve struts and SA depth to avoid occlusion with valve in valve procedure 4.  Appropriate for 26 mm Sapien 3 valve in valve TAVR Jenkins Rouge Electronically Signed   By: Jenkins Rouge M.D.   On: 06/12/2019 11:26   Result Date: 06/12/2019 EXAM: OVER-READ INTERPRETATION  CT CHEST The following report is an over-read performed by radiologist Dr. Vinnie Langton of [facility], [place] on 06/12/2019. This over-read does not include interpretation of cardiac or coronary anatomy or pathology. The coronary calcium score/coronary CTA  interpretation by the cardiologist is attached. COMPARISON:  Chest CTA 07/24/2018. FINDINGS: Extracardiac findings will be described separately under dictation for contemporaneously obtained CTA chest, abdomen and pelvis. IMPRESSION: Please see separate dictation for contemporaneously obtained CTA chest, abdomen and pelvis dated 06/12/2019 for full description of relevant extracardiac findings. Electronically Signed: By: Vinnie Langton M.D. On: 06/12/2019 10:28   Ct Angio Chest Aorta W &/or Wo Contrast  Result Date: 06/12/2019 CLINICAL DATA:  73 year old male with history of severe aortic stenosis. Preprocedural study prior to potential transcatheter aortic valve replacement (TAVR) procedure. EXAM: CT ANGIOGRAPHY CHEST, ABDOMEN AND PELVIS TECHNIQUE: Multidetector CT imaging through the chest, abdomen and pelvis was performed using the standard protocol during bolus administration of intravenous contrast. Multiplanar reconstructed images and MIPs were obtained and reviewed to evaluate the vascular anatomy. CONTRAST:  15mL OMNIPAQUE IOHEXOL 350 MG/ML SOLN COMPARISON:  CTA chest 07/24/2018. FINDINGS: CTA CHEST FINDINGS Cardiovascular: Heart size is normal. There is no significant pericardial fluid, thickening or pericardial calcification. There is aortic atherosclerosis, as well as atherosclerosis of the great vessels of the mediastinum and the coronary arteries, including calcified atherosclerotic plaque in the left main and left anterior descending coronary arteries. Status post median sternotomy for aortic root replacement with a stented bioprosthesis. Mediastinum/Lymph Nodes: No pathologically enlarged mediastinal or hilar lymph nodes. Esophagus is unremarkable in appearance. No axillary lymphadenopathy. Lungs/Pleura: No suspicious appearing pulmonary nodules or masses are noted. No acute consolidative airspace disease. No pleural effusions. Musculoskeletal/Soft Tissues: Median sternotomy wires. There are no  aggressive appearing lytic or blastic lesions noted in the visualized portions of the skeleton. CTA ABDOMEN AND PELVIS FINDINGS Hepatobiliary: No suspicious cystic or solid hepatic lesions. No intra or extrahepatic biliary ductal dilatation. Status post cholecystectomy. Pancreas: No pancreatic mass. No pancreatic ductal dilatation. No pancreatic or peripancreatic fluid collections or inflammatory changes. Spleen: Unremarkable. Adrenals/Urinary Tract: Bilateral kidneys and bilateral adrenal glands are normal in appearance. No hydroureteronephrosis. Urinary bladder is normal in appearance. Stomach/Bowel: Normal appearance of the stomach. No pathologic dilatation of small bowel or colon. Numerous colonic diverticulae are noted, without surrounding inflammatory changes to suggest an acute diverticulitis at this time. Normal appendix. Vascular/Lymphatic: Aortic atherosclerosis, without evidence of aneurysm or dissection in the abdominal or pelvic vasculature. Vascular findings and measurements pertinent to potential TAVR procedure, as detailed below. No lymphadenopathy noted in the abdomen or pelvis. Reproductive: Prostate gland and seminal  vesicles are unremarkable in appearance. Other: No significant volume of ascites.  No pneumoperitoneum. Musculoskeletal: There are no aggressive appearing lytic or blastic lesions noted in the visualized portions of the skeleton. VASCULAR MEASUREMENTS PERTINENT TO TAVR: AORTA: Minimal Aortic Diameter-17 x 18 mm Severity of Aortic Calcification-mild RIGHT PELVIS: Right Common Iliac Artery - Minimal Diameter-13.2 x 10.9 mm Tortuosity-mild Calcification-mild Right External Iliac Artery - Minimal Diameter-11.1 x 11.5 mm Tortuosity-moderate Calcification-minimal Right Common Femoral Artery - Minimal Diameter-12.1 x 10.5 mm Tortuosity-mild Calcification-mild LEFT PELVIS: Left Common Iliac Artery - Minimal Diameter-12.4 x 10.3 mm Tortuosity-mild Calcification-mild Left External Iliac Artery  - Minimal Diameter-12.4 x 12.0 mm Tortuosity-moderate Calcification-none Left Common Femoral Artery - Minimal Diameter-13.4 x 12.2 mm Tortuosity-mild Calcification-mild Review of the MIP images confirms the above findings. IMPRESSION: 1. Vascular findings and measurements pertinent to potential TAVR procedure, as detailed above. 2. Aortic atherosclerosis, in addition to left main and left anterior descending coronary artery disease. 3. Status post aortic valve replacement with stented bioprosthesis. 4. Colonic diverticulosis without evidence of acute diverticulitis at this time. 5. Additional incidental findings, as above. Electronically Signed   By: Vinnie Langton M.D.   On: 06/12/2019 11:08   Vas US Carotid  Result Date: 06/12/2019 Carotid Arterial Duplex Study Indications:       Pre tavr. Risk Factors:      Hypertension, hyperlipidemia. Comparison Study:  no prior Performing Technologist: Abram Sander RVS  Examination Guidelines: A complete evaluation includes B-mode imaging, spectral Doppler, color Doppler, and power Doppler as needed of all accessible portions of each vessel. Bilateral testing is considered an integral part of a complete examination. Limited examinations for reoccurring indications may be performed as noted.  Right Carotid Findings: +----------+--------+--------+--------+------------------+--------+             PSV cm/s EDV cm/s Stenosis Plaque Description Comments  +----------+--------+--------+--------+------------------+--------+  CCA Prox   97       16                heterogenous                 +----------+--------+--------+--------+------------------+--------+  CCA Distal 44       9                 heterogenous                 +----------+--------+--------+--------+------------------+--------+  ICA Prox   63       21       1-39%    heterogenous                 +----------+--------+--------+--------+------------------+--------+  ICA Distal 64       21                                              +----------+--------+--------+--------+------------------+--------+  ECA        57       8                                              +----------+--------+--------+--------+------------------+--------+ +----------+--------+-------+--------+-------------------+             PSV cm/s EDV cms Describe Arm Pressure (mmHG)  +----------+--------+-------+--------+-------------------+  Subclavian 69                                             +----------+--------+-------+--------+-------------------+ +---------+--------+--+--------+--+---------+  Vertebral PSV cm/s 29 EDV cm/s 11 Antegrade  +---------+--------+--+--------+--+---------+  Left Carotid Findings: +----------+--------+--------+--------+------------------+--------+             PSV cm/s EDV cm/s Stenosis Plaque Description Comments  +----------+--------+--------+--------+------------------+--------+  CCA Prox   98       19                heterogenous                 +----------+--------+--------+--------+------------------+--------+  CCA Distal 57       14                heterogenous                 +----------+--------+--------+--------+------------------+--------+  ICA Prox   56       21       1-39%    heterogenous                 +----------+--------+--------+--------+------------------+--------+  ICA Distal 57       24                                             +----------+--------+--------+--------+------------------+--------+  ECA        57       10                                             +----------+--------+--------+--------+------------------+--------+ +----------+--------+--------+--------+-------------------+             PSV cm/s EDV cm/s Describe Arm Pressure (mmHG)  +----------+--------+--------+--------+-------------------+  Subclavian 60                                              +----------+--------+--------+--------+-------------------+ +---------+--------+--+--------+-+---------+  Vertebral PSV cm/s 32 EDV cm/s 8 Antegrade   +---------+--------+--+--------+-+---------+  Summary: Right Carotid: Velocities in the right ICA are consistent with a 1-39% stenosis. Left Carotid: Velocities in the left ICA are consistent with a 1-39% stenosis. Vertebrals: Bilateral vertebral arteries demonstrate antegrade flow. *See table(s) above for measurements and observations.  Electronically signed by Deitra Mayo MD on 06/12/2019 at 4:33:01 PM.    Final    Ct Angio Abdomen Pelvis  W &/or Wo Contrast  Result Date: 06/12/2019 CLINICAL DATA:  73 year old male with history of severe aortic stenosis. Preprocedural study prior to potential transcatheter aortic valve replacement (TAVR) procedure. EXAM: CT ANGIOGRAPHY CHEST, ABDOMEN AND PELVIS TECHNIQUE: Multidetector CT imaging through the chest, abdomen and pelvis was performed using the standard protocol during bolus administration of intravenous contrast. Multiplanar reconstructed images and MIPs were obtained and reviewed to evaluate the vascular anatomy. CONTRAST:  181mL OMNIPAQUE IOHEXOL 350 MG/ML SOLN COMPARISON:  CTA chest 07/24/2018. FINDINGS: CTA CHEST FINDINGS Cardiovascular: Heart size is normal. There is no significant pericardial fluid, thickening or pericardial calcification. There is aortic atherosclerosis, as well as atherosclerosis of the great vessels of the mediastinum and the coronary arteries, including calcified atherosclerotic plaque in the left main and left anterior descending coronary arteries. Status post median sternotomy for aortic root replacement with a stented bioprosthesis. Mediastinum/Lymph Nodes: No pathologically enlarged mediastinal or hilar lymph nodes. Esophagus is unremarkable  in appearance. No axillary lymphadenopathy. Lungs/Pleura: No suspicious appearing pulmonary nodules or masses are noted. No acute consolidative airspace disease. No pleural effusions. Musculoskeletal/Soft Tissues: Median sternotomy wires. There are no aggressive appearing lytic or blastic  lesions noted in the visualized portions of the skeleton. CTA ABDOMEN AND PELVIS FINDINGS Hepatobiliary: No suspicious cystic or solid hepatic lesions. No intra or extrahepatic biliary ductal dilatation. Status post cholecystectomy. Pancreas: No pancreatic mass. No pancreatic ductal dilatation. No pancreatic or peripancreatic fluid collections or inflammatory changes. Spleen: Unremarkable. Adrenals/Urinary Tract: Bilateral kidneys and bilateral adrenal glands are normal in appearance. No hydroureteronephrosis. Urinary bladder is normal in appearance. Stomach/Bowel: Normal appearance of the stomach. No pathologic dilatation of small bowel or colon. Numerous colonic diverticulae are noted, without surrounding inflammatory changes to suggest an acute diverticulitis at this time. Normal appendix. Vascular/Lymphatic: Aortic atherosclerosis, without evidence of aneurysm or dissection in the abdominal or pelvic vasculature. Vascular findings and measurements pertinent to potential TAVR procedure, as detailed below. No lymphadenopathy noted in the abdomen or pelvis. Reproductive: Prostate gland and seminal vesicles are unremarkable in appearance. Other: No significant volume of ascites.  No pneumoperitoneum. Musculoskeletal: There are no aggressive appearing lytic or blastic lesions noted in the visualized portions of the skeleton. VASCULAR MEASUREMENTS PERTINENT TO TAVR: AORTA: Minimal Aortic Diameter-17 x 18 mm Severity of Aortic Calcification-mild RIGHT PELVIS: Right Common Iliac Artery - Minimal Diameter-13.2 x 10.9 mm Tortuosity-mild Calcification-mild Right External Iliac Artery - Minimal Diameter-11.1 x 11.5 mm Tortuosity-moderate Calcification-minimal Right Common Femoral Artery - Minimal Diameter-12.1 x 10.5 mm Tortuosity-mild Calcification-mild LEFT PELVIS: Left Common Iliac Artery - Minimal Diameter-12.4 x 10.3 mm Tortuosity-mild Calcification-mild Left External Iliac Artery - Minimal Diameter-12.4 x 12.0 mm  Tortuosity-moderate Calcification-none Left Common Femoral Artery - Minimal Diameter-13.4 x 12.2 mm Tortuosity-mild Calcification-mild Review of the MIP images confirms the above findings. IMPRESSION: 1. Vascular findings and measurements pertinent to potential TAVR procedure, as detailed above. 2. Aortic atherosclerosis, in addition to left main and left anterior descending coronary artery disease. 3. Status post aortic valve replacement with stented bioprosthesis. 4. Colonic diverticulosis without evidence of acute diverticulitis at this time. 5. Additional incidental findings, as above. Electronically Signed   By: Vinnie Langton M.D.   On: 06/12/2019 11:08   Disposition   Pt is being discharged home today in good condition.  Follow-up Plans & Appointments    Follow-up Information    [facility] Follow up on 07/13/2019.   Why: You have been rescheduled for pre admission testing and covid swabbing. Please refer to discharge instructions for details.  Contact information: [facility] [place] [id number] [phone]           Discharge Medications   Allergies as of 07/04/2019   No Known Allergies     Medication List    TAKE these medications   aspirin EC 81 MG tablet Take 81 mg by mouth daily.   diazepam 5 MG tablet Commonly known as: Valium Take 5mg  (one tablet) 1 hour prior to CT scan. You may take the second 5mg  tablet (for a total of 10mg ) if necessary.   fexofenadine 180 MG tablet Commonly known as: ALLEGRA Take 180 mg by mouth 2 (two) times daily.   omeprazole 20 MG capsule Commonly known as: PRILOSEC Take 20 mg by mouth daily.   potassium chloride 10 MEQ tablet Commonly known as: KLOR-CON Take 1 tablet (10 mEq total) by mouth every other day. What changed: when to take this   rosuvastatin  10 MG tablet Commonly known as: CRESTOR TAKE ONE TABLET BY MOUTH EVERY DAY IN THE EVENING   sodium chloride 0.65 % Soln nasal  spray Commonly known as: OCEAN Place 1 spray into both nostrils 3 (three) times daily as needed for congestion.   sotalol 80 MG tablet Commonly known as: BETAPACE Take 40 mg by mouth 2 (two) times daily.   tamsulosin 0.4 MG Caps capsule Commonly known as: FLOMAX Take 0.4 mg by mouth daily after supper.   temazepam 15 MG capsule Commonly known as: RESTORIL Take 15 mg by mouth at bedtime.   triamterene-hydrochlorothiazide 37.5-25 MG tablet Commonly known as: MAXZIDE-25 TAKE ONE TABLET BY MOUTH EVERY DAY           Outstanding Labs/Studies   PAT and covid screen 10/30  Duration of Discharge Encounter   Greater than 30 minutes including physician time.  Mable Fill, PA-C 07/04/2019, 9:35 AM [phone]

## 2019-07-04 NOTE — Progress Notes (Signed)
1 Day Post-Op Procedure(s) (LRB): attempted TRANSCATHETER AORTIC VALVE REPLACEMENT, TRANSFEMORAL (N/A) TRANSESOPHAGEAL ECHOCARDIOGRAM (TEE) (N/A) Subjective:  Has been ambulating. Was a little off balance initially last night but feels better this am.  Objective: Vital signs in last 24 hours: Temp:  [95 F (35 C)-97.8 F (36.6 C)] 97.8 F (36.6 C) (10/21 0450) Pulse Rate:  [36-80] 80 (10/21 0814) Cardiac Rhythm: Normal sinus rhythm (10/21 0824) Resp:  [12-21] 17 (10/21 0814) BP: (103-141)/(68-88) 123/85 (10/21 0814) SpO2:  [92 %-98 %] 92 % (10/21 0814)  Hemodynamic parameters for last 24 hours:    Intake/Output from previous day: 10/20 0701 - 10/21 0700 In: 1750 [P.O.:480; I.V.:1270] Out: 200 [Urine:150; Blood:50] Intake/Output this shift: No intake/output data recorded.  General appearance: alert and cooperative Neurologic: intact Heart: regular rate and rhythm, systolic murmur Lungs: clear to auscultation bilaterally Extremities: extremities normal, atraumatic, no cyanosis or edema Wound: groin sites ok  Lab Results: Recent Labs    07/03/19 1004 07/03/19 1127  HGB 14.3 14.3  HCT 42.0 42.0   BMET:  Recent Labs    07/03/19 1004 07/03/19 1127  NA 144 144  K 3.8 3.9  CL 104 106  GLUCOSE 135* 116*  BUN 9 8  CREATININE 0.70 0.80    PT/INR: No results for input(s): LABPROT, INR in the last 72 hours. ABG    Component Value Date/Time   PHART 7.462 (H) 06/29/2019 1145   HCO3 25.3 06/29/2019 1145   TCO2 24 07/03/2019 1127   O2SAT 97.7 06/29/2019 1145   CBG (last 3)  No results for input(s): GLUCAP in the last 72 hours.  Assessment/Plan: S/P Procedure(s) (LRB): attempted TRANSCATHETER AORTIC VALVE REPLACEMENT, TRANSFEMORAL (N/A) TRANSESOPHAGEAL ECHOCARDIOGRAM (TEE) (N/A)  He has been hemodynamically stable in sinus rhythm. Plan home today and will do transapical TAVR in 2 weeks. He is in agreement with that plan.   LOS: 1 day    Gaye Pollack 07/04/2019

## 2019-07-12 NOTE — Pre-Procedure Instructions (Signed)
[place], [place] [address] Phone: [phone] Fax: [phone]      Your procedure is scheduled on Tuesday, November 3rd.  Report to [facility] Main Entrance "A" at 5:30 A.M., and check in at the Admitting office.  Call this number if you have problems the morning of surgery:  [phone]  Call [phone] if you have any questions prior to your surgery date Monday-Friday 8am-4pm    Remember:  Do not eat or drink after midnight the night before your surgery   Do NOT take any medications the morning of surgery.   As of today, STOP taking any Aleve, Naproxen, Ibuprofen, Motrin, Advil, Goody's, BC's, all herbal medications, fish oil, and all vitamins.    The Morning of Surgery  Do not wear jewelry.  Do not wear lotions, powders, or colognes, or deodorant  Men may shave face and neck.  Do not bring valuables to the hospital.  [facility] is not responsible for any belongings or valuables.  If you are a smoker, DO NOT Smoke 24 hours prior to surgery IF you wear a CPAP at night please bring your mask, tubing, and machine the morning of surgery   Remember that you must have someone to transport you home after your surgery, and remain with you for 24 hours if you are discharged the same day.   Contacts, glasses, hearing aids, dentures or bridgework may not be worn into surgery.    Leave your suitcase in the car.  After surgery it may be brought to your room.  For patients admitted to the hospital, discharge time will be determined by your treatment team.  Patients discharged the day of surgery will not be allowed to drive home.    Special instructions:   [facility]- Preparing For Surgery  Before surgery, you can play an important role. Because skin is not sterile, your skin needs to be as free of germs as possible. You can reduce the number of germs on your skin by washing with CHG (chlorahexidine gluconate) Soap  before surgery.  CHG is an antiseptic cleaner which kills germs and bonds with the skin to continue killing germs even after washing.    Oral Hygiene is also important to reduce your risk of infection.  Remember - BRUSH YOUR TEETH THE MORNING OF SURGERY WITH YOUR REGULAR TOOTHPASTE  Please do not use if you have an allergy to CHG or antibacterial soaps. If your skin becomes reddened/irritated stop using the CHG.  Do not shave (including legs and underarms) for at least 48 hours prior to first CHG shower. It is OK to shave your face.  Please follow these instructions carefully.   1. Shower the NIGHT BEFORE SURGERY and the MORNING OF SURGERY with CHG Soap.   2. If you chose to wash your hair, wash your hair first as usual with your normal shampoo.  3. After you shampoo, rinse your hair and body thoroughly to remove the shampoo.  4. Use CHG as you would any other liquid soap. You can apply CHG directly to the skin and wash gently with a scrungie or a clean washcloth.   5. Apply the CHG Soap to your body ONLY FROM THE NECK DOWN.  Do not use on open wounds or open sores. Avoid contact with your eyes, ears, mouth and genitals (private parts). Wash Face and genitals (private parts)  with your normal soap.   6. Wash thoroughly, paying special  attention to the area where your surgery will be performed.  7. Thoroughly rinse your body with warm water from the neck down.  8. DO NOT shower/wash with your normal soap after using and rinsing off the CHG Soap.  9. Pat yourself dry with a CLEAN TOWEL.  10. Wear CLEAN PAJAMAS to bed the night before surgery, wear comfortable clothes the morning of surgery  11. Place CLEAN SHEETS on your bed the night of your first shower and DO NOT SLEEP WITH PETS.    Day of Surgery:  Do not apply any deodorants/lotions. Please shower the morning of surgery with the CHG soap  Please wear clean clothes to the hospital/surgery center.   Remember to brush your teeth  WITH YOUR REGULAR TOOTHPASTE.   Please read over the following fact sheets that you were given.

## 2019-07-13 ENCOUNTER — Encounter (HOSPITAL_COMMUNITY): Payer: Self-pay

## 2019-07-13 ENCOUNTER — Encounter (HOSPITAL_COMMUNITY)
Admit: 2019-07-13 | Discharge: 2019-07-13 | Disposition: A | Discharge status: Home / Self Care | Payer: PPO | Attending: Cardiovascular Disease | Admitting: Cardiovascular Disease

## 2019-07-13 ENCOUNTER — Other Ambulatory Visit: Payer: Self-pay

## 2019-07-13 ENCOUNTER — Ambulatory Visit (HOSPITAL_COMMUNITY)
Admission: RE | Admit: 2019-07-13 | Discharge: 2019-07-13 | Disposition: A | Discharge status: Home / Self Care | Payer: PPO | Source: Ambulatory Visit | Attending: Cardiovascular Disease | Admitting: Cardiovascular Disease

## 2019-07-13 ENCOUNTER — Other Ambulatory Visit (HOSPITAL_COMMUNITY)
Admission: RE | Admit: 2019-07-13 | Discharge: 2019-07-13 | Disposition: A | Discharge status: Home / Self Care | Payer: PPO | Source: Ambulatory Visit | Attending: Physician Assistant | Admitting: Physician Assistant

## 2019-07-13 DIAGNOSIS — I350 Nonrheumatic aortic (valve) stenosis: Secondary | ICD-10-CM | POA: Diagnosis not present

## 2019-07-13 DIAGNOSIS — Z01818 Encounter for other preprocedural examination: Secondary | ICD-10-CM | POA: Diagnosis not present

## 2019-07-13 DIAGNOSIS — Z20828 Contact with and (suspected) exposure to other viral communicable diseases: Secondary | ICD-10-CM | POA: Diagnosis not present

## 2019-07-13 DIAGNOSIS — Z01812 Encounter for preprocedural laboratory examination: Secondary | ICD-10-CM | POA: Diagnosis not present

## 2019-07-13 LAB — APTT: aPTT: 26 seconds (ref 24–36)

## 2019-07-13 LAB — COMPREHENSIVE METABOLIC PANEL
ALT: 21 U/L (ref 0–44)
AST: 27 U/L (ref 15–41)
Albumin: 4.2 g/dL (ref 3.5–5.0)
Alkaline Phosphatase: 72 U/L (ref 38–126)
Anion gap: 9 (ref 5–15)
BUN: 13 mg/dL (ref 8–23)
CO2: 28 mmol/L (ref 22–32)
Calcium: 9.6 mg/dL (ref 8.9–10.3)
Chloride: 104 mmol/L (ref 98–111)
Creatinine, Ser: 1.06 mg/dL (ref 0.61–1.24)
GFR calc Af Amer: >60 mL/min (ref >60)
GFR calc non Af Amer: >60 mL/min (ref >60)
Glucose, Bld: 87 mg/dL (ref 70–99)
Potassium: 3.4 mmol/L — ABNORMAL LOW (ref 3.5–5.1)
Sodium: 141 mmol/L (ref 135–145)
Total Bilirubin: 1.5 mg/dL — ABNORMAL HIGH (ref 0.3–1.2)
Total Protein: 6.9 g/dL (ref 6.5–8.1)

## 2019-07-13 LAB — PROTIME-INR
INR: 1 (ref 0.8–1.2)
Prothrombin Time: 13.4 seconds (ref 11.4–15.2)

## 2019-07-13 LAB — URINALYSIS, ROUTINE W REFLEX MICROSCOPIC
Bilirubin Urine: NEGATIVE
Glucose, UA: NEGATIVE mg/dL
Hgb urine dipstick: NEGATIVE
Ketones, ur: NEGATIVE mg/dL
Leukocytes,Ua: NEGATIVE
Nitrite: NEGATIVE
Protein, ur: NEGATIVE mg/dL
Specific Gravity, Urine: 1.014 (ref 1.005–1.030)
pH: 6 (ref 5.0–8.0)

## 2019-07-13 LAB — TYPE AND SCREEN
ABO/RH(D): A POS
Antibody Screen: NEGATIVE

## 2019-07-13 LAB — HEMOGLOBIN A1C
Hgb A1c MFr Bld: 5.4 % (ref 4.8–5.6)
Mean Plasma Glucose: 108.28 mg/dL

## 2019-07-13 LAB — BLOOD GAS, ARTERIAL
Acid-Base Excess: 0.3 mmol/L (ref 0.0–2.0)
Bicarbonate: 23.6 mmol/L (ref 20.0–28.0)
Drawn by: 275531
FIO2: 0.21
O2 Saturation: 97.3 %
Patient temperature: 37
pCO2 arterial: 32.8 mmHg (ref 32.0–48.0)
pH, Arterial: 7.47 — ABNORMAL HIGH (ref 7.350–7.450)
pO2, Arterial: 86.5 mmHg (ref 83.0–108.0)

## 2019-07-13 LAB — CBC
HCT: 48.7 % (ref 39.0–52.0)
Hemoglobin: 16.9 g/dL (ref 13.0–17.0)
MCH: 32.8 pg (ref 26.0–34.0)
MCHC: 34.7 g/dL (ref 30.0–36.0)
MCV: 94.4 fL (ref 80.0–100.0)
Platelets: 159 10*3/uL (ref 150–400)
RBC: 5.16 MIL/uL (ref 4.22–5.81)
RDW: 12.4 % (ref 11.5–15.5)
WBC: 7.6 10*3/uL (ref 4.0–10.5)
nRBC: 0 % (ref 0.0–0.2)

## 2019-07-13 LAB — BRAIN NATRIURETIC PEPTIDE: B Natriuretic Peptide: 72 pg/mL (ref 0.0–100.0)

## 2019-07-13 NOTE — Progress Notes (Signed)
PCP - Dr. Nelda Bucks Cardiologist - Dr. Geraldo Pitter  PPM/ICD - N/A Device Orders -  Rep Notified -   Chest x-ray - 07/13/19 EKG - 07/03/19 Stress Test - 10+ years ago ECHO - 07/03/19 Cardiac Cath - 06/20/19  Sleep Study - denies CPAP - denies  Blood Thinner Instructions: N/A Aspirin Instructions:N/A  ERAS Protcol -n/a PRE-SURGERY Ensure or G2-   COVID TEST- Will go today after PAT appointment. Pt aware of quarantine after testing.    Anesthesia review: Yes, heart history. Previous TAVR attempt on 07/03/19.    Patient denies shortness of breath, fever, cough and chest pain at PAT appointment   All instructions explained to the patient, with a verbal understanding of the material. Patient agrees to go over the instructions while at home for a better understanding. Patient also instructed to self quarantine after being tested for COVID-19. The opportunity to ask questions was provided.   Coronavirus Screening  Have you experienced the following symptoms:  Cough yes/no: No Fever (>100.16F)  yes/no: No Runny nose yes/no: No Sore throat yes/no: No Difficulty breathing/shortness of breath  yes/no: No  Have you or a family member traveled in the last 14 days and where? yes/no: No   If the patient indicates "YES" to the above questions, their PAT will be rescheduled to limit the exposure to others and, the surgeon will be notified. THE PATIENT WILL NEED TO BE ASYMPTOMATIC FOR 14 DAYS.   If the patient is not experiencing any of these symptoms, the PAT nurse will instruct them to NOT bring anyone with them to their appointment since they may have these symptoms or traveled as well.   Please remind your patients and families that hospital visitation restrictions are in effect and the importance of the restrictions.

## 2019-07-16 ENCOUNTER — Encounter (HOSPITAL_COMMUNITY): Payer: Self-pay | Admitting: Certified Registered Nurse Anesthetist

## 2019-07-16 LAB — NOVEL CORONAVIRUS, NAA (HOSP ORDER, SEND-OUT TO REF LAB; TAT 18-24 HRS): SARS-CoV-2, NAA: NOT DETECTED

## 2019-07-16 MED ORDER — NOREPINEPHRINE 4 MG/250ML-% IV SOLN
0.0000 ug/min | INTRAVENOUS | Status: AC
  Administered 2019-07-17: 2 ug/min via INTRAVENOUS
  Filled 2019-07-16: qty 250

## 2019-07-16 MED ORDER — POTASSIUM CHLORIDE 2 MEQ/ML IV SOLN
80.0000 meq | INTRAVENOUS | Status: DC
  Filled 2019-07-16: qty 40

## 2019-07-16 MED ORDER — DEXMEDETOMIDINE HCL IN NACL 400 MCG/100ML IV SOLN
0.1000 ug/kg/h | INTRAVENOUS | Status: DC
  Filled 2019-07-16: qty 100

## 2019-07-16 MED ORDER — VANCOMYCIN HCL 10 G IV SOLR
1500.0000 mg | INTRAVENOUS | Status: AC
  Administered 2019-07-17: 1500 mg via INTRAVENOUS
  Filled 2019-07-16: qty 1500

## 2019-07-16 MED ORDER — SODIUM CHLORIDE 0.9 % IV SOLN
INTRAVENOUS | Status: DC
  Filled 2019-07-16: qty 30

## 2019-07-16 MED ORDER — SODIUM CHLORIDE 0.9 % IV SOLN
1.5000 g | INTRAVENOUS | Status: AC
  Administered 2019-07-17: 1.5 g via INTRAVENOUS
  Filled 2019-07-16: qty 1.5

## 2019-07-16 MED ORDER — MAGNESIUM SULFATE 50 % IJ SOLN
40.0000 meq | INTRAMUSCULAR | Status: DC
  Filled 2019-07-16: qty 9.85

## 2019-07-16 NOTE — H&P (Signed)
[address]       [place] [address]             [phone]      Cardiothoracic Surgery Admission History and Physical   Referring Provider is Revankar, Reita Cliche, MD  Primary Cardiologist is No primary care provider on file.  PCP is Nicoletta Dress, MD      Chief Complaint  Patient presents with   Prosthetic Aortic Valve Stenosis      HPI:  Patient is a 73 year old gentleman with a history of hypertension, hyperlipidemia, and atrial tachycardia status post ablation who underwent bioprosthetic Bentall procedure by me in 2010 at [facility]. He had a 25 mm Edwards Magna-Ease pericardial valve inside a Gelweave Valsalva graft. The valve model was 3300 TFX. He has done well since that surgery now presents with a couple month history of exertional fatigue such as doing yard work. His wife said that he has to rest frequently and is tired at the end of the day. He denies any shortness of breath or chest pain. Has had no dizziness or syncope. He denies any edema. He has a history of atrial fibrillation and underwent A. fib ablation in 2017. He continues to take sotalol for rhythm control. He has had periodic echocardiograms to follow-up on his prosthetic valve. His most recent echocardiogram 05/14/2011 showed an increase in the mean gradient to 36.5 mmHg with a peak gradient of 60 mm. The dimensionless index of 0.26. Left ventricular ejection fraction was 60 to 65%. His mean aortic valve gradient 1 year ago was 22 mmHg.   On 07/03/19 Dr. Burt Knack and I attempted transfemoral valve in valve TAVR but we were not able to pass a wire across the valve despite trying multiple different catheters and after 50+ minutes of fluoroscopy time we decided to stop and plan transapical insertion at a later date. He had an uneventful recovery from that and has continued to do well at home.  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      Past Medical History:    Diagnosis Date   Atrial tachycardia [facility])    s/p ablation and on sotolol   GERD (gastroesophageal reflux disease)    H/O cardiac radiofrequency ablation 08/2016   Hyperlipidemia    Hypertension    S/P AVR (aortic valve replacement) 2010        Past Surgical History:  Procedure Laterality Date   ABLATION OF DYSRHYTHMIC FOCUS     CARDIAC VALVE REPLACEMENT     CHOLECYSTECTOMY     KIDNEY STONE SURGERY     KNEE ARTHROSCOPY Bilateral    MASS EXCISION Right 12/26/2017   Procedure: RIGHT INDEX EXCISION CYST AND DEBRIDEMENT OF DISTAL INTERPHALANGEAL JOINT; Surgeon: Leanora Cover, MD; Location: [facility]; Service: Orthopedics; Laterality: Right; Bier block   RIGHT HEART CATH AND CORONARY ANGIOGRAPHY N/A 06/20/2019   Procedure: RIGHT HEART CATH AND CORONARY ANGIOGRAPHY; Surgeon: Sherren Mocha, MD; Location: [facility]; Service: Cardiovascular; Laterality: N/A;        Family History  Problem Relation Age of Onset   CAD Brother    Social History        Socioeconomic History   Marital status: Married    Spouse name: Not on file   Number of children: Not on file   Years of education: Not on file   Highest education level: Not on file  Occupational History  Not on file  Social Needs   Financial resource strain: Not on file   Food insecurity    Worry: Not on file    Inability: Not on file   Transportation needs    Medical: Not on file    Non-medical: Not on file  Tobacco Use   Smoking status: Never Smoker   Smokeless tobacco: Never Used  Substance and Sexual Activity   Alcohol use: No    Frequency: Never   Drug use: No   Sexual activity: Not on file  Lifestyle   Physical activity    Days per week: Not on file    Minutes per session: Not on file   Stress: Not on file  Relationships   Social connections    Talks on phone: Not on file    Gets together: Not on file    Attends religious service: Not on file    Active  member of club or organization: Not on file    Attends meetings of clubs or organizations: Not on file    Relationship status: Not on file   Intimate partner violence    Fear of current or ex partner: Not on file    Emotionally abused: Not on file    Physically abused: Not on file    Forced sexual activity: Not on file  Other Topics Concern   Not on file  Social History Narrative   Not on file         Current Outpatient Medications  Medication Sig Dispense Refill   aspirin EC 81 MG tablet Take 81 mg by mouth daily.      fexofenadine (ALLEGRA) 180 MG tablet Take 180 mg by mouth 2 (two) times daily.      rosuvastatin (CRESTOR) 10 MG tablet TAKE ONE TABLET BY MOUTH EVERY DAY IN THE EVENING (Patient taking differently: Take 10 mg by mouth every evening. ) 90 tablet 3   sotalol (BETAPACE) 80 MG tablet Take 40 mg by mouth 2 (two) times daily.     temazepam (RESTORIL) 15 MG capsule Take 15 mg by mouth at bedtime.      triamterene-hydrochlorothiazide (MAXZIDE-25) 37.5-25 MG tablet TAKE ONE TABLET BY MOUTH EVERY DAY (Patient taking differently: Take 1 tablet by mouth daily. ) 90 tablet 1   diazepam (VALIUM) 5 MG tablet Take 5mg  (one tablet) 1 hour prior to CT scan. You may take the second 5mg  tablet (for a total of 10mg ) if necessary. (Patient not taking: Reported on 06/27/2019) 2 tablet 0   omeprazole (PRILOSEC) 20 MG capsule Take 20 mg by mouth daily.      sodium chloride (OCEAN) 0.65 % SOLN nasal spray Place 1 spray into both nostrils 3 (three) times daily as needed for congestion.     tamsulosin (FLOMAX) 0.4 MG CAPS capsule Take 0.4 mg by mouth daily after supper.      No current facility-administered medications for this visit.   No Known Allergies  Review of Systems:  General: normal appetite, + decreased energy, no weight gain, no weight loss, no fever  Cardiac: no chest pain with exertion, no chest pain at rest, no SOB with exertion, no resting SOB, no PND, no orthopnea, no  palpitations, + arrhythmia, + atrial fibrillation, no LE edema, no dizzy spells, no syncope  Respiratory: no shortness of breath, no home oxygen, no productive cough, no dry cough, no bronchitis, no wheezing, no hemoptysis, no asthma, no pain with inspiration or cough, no sleep apnea, no CPAP at night  GI: no difficulty swallowing, no reflux, no frequent heartburn, no hiatal hernia, no abdominal pain, no constipation, no diarrhea, no hematochezia, no hematemesis, no melena  GU: no dysuria, no frequency, no urinary tract infection, no hematuria, no enlarged prostate, no kidney stones, no kidney disease  Vascular: no pain suggestive of claudication, no pain in feet, no leg cramps, no varicose veins, no DVT, no non-healing foot ulcer  Neuro: no stroke, no TIA's, no seizures, no headaches, no temporary blindness one eye, no slurred speech, no peripheral neuropathy, no chronic pain, o instability of gait, no memory/cognitive dysfunction  Musculoskeletal: + arthritis, no joint swelling, no myalgias, no difficulty walking, normal mobility  Skin: no rash, no itching, no skin infections, no pressure sores or ulcerations  Psych: no anxiety, no depression, no nervousness, no unusual recent stress  Eyes: no blurry vision, no floaters, no recent vision changes, + wears glasses or contacts  ENT: no hearing loss, no loose or painful teeth, no dentures, last saw dentist every 6 months.  Hematologic: no easy bruising, no abnormal bleeding, no clotting disorder, no frequent epistaxis  Endocrine: no diabetes, does not check CBG's at home  Physical Exam:  BP (!) 133/91 (BP Location: Right Arm)   Pulse 86   Temp (!) 97.5 F (36.4 C) (Skin) Comment: RA   Resp 20   Wt 186 lb 6.4 oz (84.6 kg)   SpO2 94% Comment: RA   BMI 27.53 kg/m  General: well-appearing  HEENT: Unremarkable, NCAT, PERLA, EOMI, teeth in good condition  Neck: no JVD, no bruits, no adenopathy  Chest: clear to auscultation, symmetrical breath sounds, no  wheezes, no rhonchi  CV: RRR, grade lll/VI crescendo/decrescendo murmur heard best at RSB, no diastolic murmur  Abdomen: soft, non-tender, no masses  Extremities: warm, well-perfused, pulses palpable in feet, no LE edema  Rectal/GU Deferred  Neuro: Grossly non-focal and symmetrical throughout  Skin: Clean and dry, no rashes, no breakdown  Diagnostic Tests:  ECHOCARDIOGRAM REPORT  Patient Name: HENRI NODARSE Date of Exam: 05/14/2019  Medical Rec #: [id number] Height: 68.5 in  Accession #: [id number] Weight: 189.0 lb  Date of Birth: September 04, 1946 BSA: 2.01 m  Patient Age: 63 years BP: 142/76 mmHg  Patient Gender: M HR: 62 bpm.  Exam Location: [place] [facility]  Procedure: 2D Echo  Indications: S/P AVR (aortic valve replacement) [Z95.2] - Primary  History: Patient has prior history of Echocardiogram examinations, most  recent 07/18/2018. Atrial Fibrillation Risk Factors: Hypertension  and Dyslipidemia.  Sonographer: Luane School  Referring Phys: [place]  1. The left ventricle has normal systolic function with an ejection fraction of 60-65%. The cavity size was normal. There is moderately increased left ventricular wall thickness. Left ventricular diastolic Doppler parameters are consistent with impaired  relaxation.  2. The right ventricle has normal systolic function. The cavity was normal. There is no increase in right ventricular wall thickness.  3. Left atrial size was mild-moderately dilated.  4. The mitral valve is grossly normal.  5. The tricuspid valve is grossly normal.  6. Aortic valve regurgitation was not assessed by color flow Doppler. Severe stenosis of the aortic valve.  7. The patient has a bioprosthetic valve.  8. Aneurysm of the ascending aorta, measuring 46 mm.  9. The aorta is abnormal unless otherwise noted.  FINDINGS  Left Ventricle: The left ventricle has normal systolic function, with an ejection fraction of 60-65%. The cavity size was normal. There  is moderately increased left ventricular wall thickness.  Left ventricular diastolic Doppler parameters are consistent  with impaired relaxation.  Right Ventricle: The right ventricle has normal systolic function. The cavity was normal. There is no increase in right ventricular wall thickness.  Left Atrium: Left atrial size was mild-moderately dilated.  Right Atrium: Right atrial size was normal in size. Right atrial pressure is estimated at 3 mmHg.  Interatrial Septum: No atrial level shunt detected by color flow Doppler.  Pericardium: There is no evidence of pericardial effusion.  Mitral Valve: The mitral valve is grossly normal. Mitral valve regurgitation was not assessed by color flow Doppler.  Tricuspid Valve: The tricuspid valve is grossly normal. Tricuspid valve regurgitation is trivial by color flow Doppler.  Aortic Valve: The aortic valve has been repaired/replaced Aortic valve regurgitation was not assessed by color flow Doppler. There is Severe stenosis of the aortic valve, with a calculated valve area of 1.00 cm. The patient has a bioprosthetic valve.  Pulmonic Valve: The pulmonic valve was not well visualized. Pulmonic valve regurgitation was not assessed by color flow Doppler.  Aorta: The aorta is abnormal unless otherwise noted. There is an aneurysm involving the ascending aorta. The aneurysm measures 46 mm.  Venous: The inferior vena cava is normal in size with greater than 50% respiratory variability.  +--------------+--------++   LEFT VENTRICLE     +----------------+---------++  +--------------+--------++  Diastology        PLAX 2D      +----------------+---------++  +--------------+--------++  LV e' lateral:  6.09 cm/s     LVIDd:  4.20 cm    +----------------+---------++  +--------------+--------++  LV E/e' lateral: 12.3      LVIDs:  2.90 cm    +----------------+---------++  +--------------+--------++  LV e' medial:  4.57 cm/s     LV PW:  1.40 cm     +----------------+---------++  +--------------+--------++  LV E/e' medial:  16.5      LV IVS:  1.50 cm    +----------------+---------++  +--------------+--------++   LVOT diam:  2.20 cm     +--------------+--------++   LV SV:  46 ml     +--------------+--------++   LV SV Index:  22.56     +--------------+--------++   LVOT Area:  3.80 cm    +--------------+--------++          +--------------+--------++  +---------------+---------++   RIGHT VENTRICLE      +---------------+---------++   RV S prime:  8.05 cm/s    +---------------+---------++   TAPSE (M-mode): 1.6 cm     +---------------+---------++  +---------------+-------++-----------++   LEFT ATRIUM     Index     +---------------+-------++-----------++   LA diam:  4.50 cm  2.24 cm/m     +---------------+-------++-----------++   LA Vol (A2C):  61.5 ml  30.66 ml/m    +---------------+-------++-----------++   LA Vol (A4C):  90.6 ml  45.16 ml/m    +---------------+-------++-----------++   LA Biplane Vol: 76.3 ml  38.03 ml/m    +---------------+-------++-----------++  +------------+---------++-----------++   RIGHT ATRIUM    Index     +------------+---------++-----------++   RA Area:  17.50 cm       +------------+---------++-----------++   RA Volume:  38.50 ml   19.19 ml/m    +------------+---------++-----------++  +------------------+------------++   AORTIC VALVE       +------------------+------------++   AV Area (Vmax):  0.89 cm     +------------------+------------++   AV Area (Vmean):  0.93 cm     +------------------+------------++   AV Area (VTI):  1.00 cm     +------------------+------------++  AV Vmax:  386.50 cm/s     +------------------+------------++   AV Vmean:  284.500 cm/s    +------------------+------------++   AV VTI:  0.866 m     +------------------+------------++   AV Peak Grad:  59.8 mmHg     +------------------+------------++   AV Mean Grad:  36.5 mmHg       +------------------+------------++   LVOT Vmax:  90.90 cm/s     +------------------+------------++   LVOT Vmean:  69.600 cm/s     +------------------+------------++   LVOT VTI:  0.228 m     +------------------+------------++   LVOT/AV VTI ratio: 0.26     +------------------+------------++  +-------------+-------++   AORTA       +-------------+-------++   Ao Root diam: 4.20 cm    +-------------+-------++   Ao Asc diam:  4.70 cm    +-------------+-------++  +--------------+----------++ +---------------+-----------++   MITRAL VALVE       TRICUSPID VALVE      +--------------+----------++ +---------------+-----------++   MV Area (PHT): 2.26 cm     TR Peak grad:  9.4 mmHg     +--------------+----------++ +---------------+-----------++   MV PHT:  97.15 msec    TR Vmax:  153.00 cm/s    +--------------+----------++ +---------------+-----------++   MV Decel Time: 335 msec     +--------------+----------++ +--------------+-------+  +--------------+-----------++  SHUNTS       MV E velocity: 75.20 cm/s    +--------------+-------+  +--------------+-----------++  Systemic VTI:  0.23 m     MV A velocity: 108.00 cm/s   +--------------+-------+  +--------------+-----------++  Systemic Diam: 2.20 cm    MV E/A ratio:  0.70    +--------------+-------+  +--------------+-----------++  Jyl Heinz MD  Electronically signed by Jyl Heinz MD  Signature Date/Time: 05/14/2019/3:13:21 PM  Panel Physicians Referring Physician Case Authorizing Physician  Sherren Mocha, MD (Primary)    Procedures  RIGHT HEART CATH AND CORONARY ANGIOGRAPHY  Conclusion  1. Widely patent coronary arteries without stenosis  2. Normal right heart hemodynamics  3. Known severe bioprosthetic aortic valve stenosis by echo assessment  Plan: continue multidisciplinary heart team evaluation for treatment of severe bioprosthetic aortic stenosis.   Indications  Severe aortic stenosis [I35.0 (ICD-10-CM)]  Procedural  Details  Technical Details INDICATION: Severe bioprosthetic aortic valve stenosis  PROCEDURAL DETAILS: There was an indwelling IV in a right antecubital vein. Using normal sterile technique, the IV was changed out for a 5 Fr brachial sheath over a 0.018 inch wire. The right wrist was then prepped, draped, and anesthetized with 1% lidocaine. Using the modified Seldinger technique a 5/6 French Slender sheath was placed in the right radial artery. Intra-arterial verapamil was administered through the radial artery sheath. IV heparin was administered after a JR4 catheter was advanced into the central aorta. A Swan-Ganz catheter was used for the right heart catheterization. Standard protocol was followed for recording of right heart pressures and sampling of oxygen saturations. Fick cardiac output was calculated. Standard Judkins catheters were used for selective coronary angiography. The aortic valve was difficult to cross from radial access. The valve was crossed with an AL-1 catheter directing a straight wire, but the AL-1 would not advance into the left ventricle. I then tried to cross the valve with a MPA catheter but this was unsuccessful. Catheters, wires, and sheaths were removed without complication. There were no immediate procedural complications. The patient was transferred to the post catheterization recovery area for further monitoring.    Estimated blood loss <50 mL.   During this procedure medications were administered to achieve  and maintain moderate conscious sedation while the patient's heart rate, blood pressure, and oxygen saturation were continuously monitored and I was present face-to-face 100% of this time.  Medications  (Filter: Administrations occurring from 06/20/19 1322 to 06/20/19 1425)          (important) Continuous medications are totaled by the amount administered until 06/20/19 1425.  Medication Rate/Dose/Volume Action  Date Time   Heparin (Porcine) in NaCl 1000-0.9  UT/500ML-% SOLN (mL) 500 mL Given 06/20/19 1341   Total dose as of 06/20/19 1425 500 mL Given 1341   1,000 mL        fentaNYL (SUBLIMAZE) injection (mcg) 25 mcg Given 06/20/19 1346   Total dose as of 06/20/19 1425        25 mcg        midazolam (VERSED) injection (mg) 1 mg Given 06/20/19 1346   Total dose as of 06/20/19 1425        1 mg        lidocaine (PF) (XYLOCAINE) 1 % injection (mL) 2 mL Given 06/20/19 1346   Total dose as of 06/20/19 1425 2 mL Given 1347   4 mL        Radial Cocktail/Verapamil only (mL) 10 mL Given 06/20/19 1347   Total dose as of 06/20/19 1425        10 mL        heparin injection (Units) 4,000 Units Given 06/20/19 1355   Total dose as of 06/20/19 1425        4,000 Units        iohexol (OMNIPAQUE) 350 MG/ML injection (mL) 60 mL Given 06/20/19 1415   Total dose as of 06/20/19 1425        60 mL        Sedation Time  Sedation Time Physician-1: 26 minutes 14 seconds  Contrast  Medication Name Total Dose  iohexol (OMNIPAQUE) 350 MG/ML injection 60 mL  Radiation/Fluoro  Fluoro time: 10.3 (min)  DAP: 20242 (mGycm2)  Cumulative Air Kerma: 374 (mGy)  Coronary Findings  Diagnostic  Dominance: Right  Left Main  Vessel is angiographically normal.  Left Anterior Descending  The vessel exhibits minimal luminal irregularities.  Ramus Intermedius  Vessel is moderate in size. Vessel is angiographically normal.  Left Circumflex  Vessel is angiographically normal.  Right Coronary Artery  Vessel is moderate in size. Vessel is angiographically normal.  Right Ventricular Branch  Widely patent, dominant RCA with no stenosis  Intervention  No interventions have been documented.  Coronary Diagrams  Diagnostic  Dominance: Right   Intervention  Implants     No implant documentation for this case.  Syngo Images  Link to Procedure Log   Show images for CARDIAC CATHETERIZATION Procedure Log  Images on Long Term Storage    Show images for Montravious, Despain       Hemo Data   Most Recent Value  Fick Cardiac Output 5.06 L/min  Fick Cardiac Output Index 2.53 (L/min)/BSA  RA A Wave 6 mmHg  RA V Wave 5 mmHg  RA Mean 3 mmHg  RV Systolic Pressure 24 mmHg  RV Diastolic Pressure 0 mmHg  RV EDP 6 mmHg  PA Systolic Pressure 23 mmHg  PA Diastolic Pressure 0 mmHg  PA Mean 12 mmHg  PW A Wave 11 mmHg  PW V Wave 12 mmHg  PW Mean 8 mmHg  AO Systolic Pressure [id number] mmHg  AO Diastolic Pressure 72 mmHg  AO Mean 97 mmHg  QP/QS 1  TPVR Index 4.74 HRUI  TSVR Index 38.33 HRUI  PVR SVR Ratio 0.04  TPVR/TSVR Ratio 0.12   ADDENDUM REPORT: 06/12/2019 11:26  CLINICAL DATA: Aortic stenosis  EXAM:  Cardiac TAVR CT  TECHNIQUE:  The patient was scanned on a Siemens Force [id number] slice scanner. A 120  kV retrospective scan was triggered in the descending thoracic aorta  at 111 HU's. Gantry rotation speed was 270 msecs and collimation was  .9 mm. No beta blockade or nitro were given. The 3D data set was  reconstructed in 5% intervals of the R-R cycle. Systolic and  diastolic phases were analyzed on a dedicated work station using  MPR, MIP and VRT modes. The patient received 80 cc of contrast.  FINDINGS:  Aortic Valve: Patient has had a Bentall procedure with AVR and root  replacement Stented pericardial tissue valve 25 mm Edwards  MagnaEase. Thickened leaflets with restricted motion. Annulus intact  ID measures 24 mm consistent with described valve replacement.  Aorta: Dilated and angulated ascending root. Suture line noted for  Bentall bovine arch with moderate calcific atherosclerosis  Ascending Thoracic Aorta: 3.9 cm  Aortic Arch: 3.1 cm  Descending Thoracic Aorta: 2.9 cm  Sinus of Valsalva Measurements:  Non-coronary: 38.6 mm  Right - coronary: 38.2 mm  Left - coronary: 38 mm  Coronary Artery Height above Annulus:  Left Main: 19.9 mm above annulus and above valve strut. SA 11 mm  away from virtual valve at ostium  Right Coronary: 16.1 mm above annulus and  near top edge of valve  strut SA 12.6 mm away from virtual valve ostium  IMPRESSION:  1. 25 mm Edwards stented pericardial tissue valve with ID 24 mm  MagnaEase. Thickened leaflets with restricted motion. Intact annulus  2. Angulated and dilated aortic root 3.9 cm Appears to have had  Bentall with root replacement Bovine Arch  3. Coronary arteries appear to have sufficient height above valve  struts and SA depth to avoid occlusion with valve in valve procedure  4. Appropriate for 26 mm Sapien 3 valve in valve TAVR  Jenkins Rouge  Electronically Signed  By: Jenkins Rouge M.D.  On: 06/12/2019 11:26   Addended by Josue Hector, MD on 06/12/2019 11:29 AM  Study Result   EXAM:  OVER-READ INTERPRETATION CT CHEST  The following report is an over-read performed by radiologist Dr.  Vinnie Langton of [facility] [place] on 06/12/2019. This  over-read does not include interpretation of cardiac or coronary  anatomy or pathology. The coronary calcium score/coronary CTA  interpretation by the cardiologist is attached.  COMPARISON: Chest CTA 07/24/2018.  FINDINGS:  Extracardiac findings will be described separately under dictation  for contemporaneously obtained CTA chest, abdomen and pelvis.  IMPRESSION:  Please see separate dictation for contemporaneously obtained CTA  chest, abdomen and pelvis dated 06/12/2019 for full description of  relevant extracardiac findings.  Electronically Signed:  By: Vinnie Langton M.D.  On: 06/12/2019 10:28  CLINICAL DATA: 73 year old male with history of severe aortic  stenosis. Preprocedural study prior to potential transcatheter  aortic valve replacement (TAVR) procedure.  EXAM:  CT ANGIOGRAPHY CHEST, ABDOMEN AND PELVIS  TECHNIQUE:  Multidetector CT imaging through the chest, abdomen and pelvis was  performed using the standard protocol during bolus administration of  intravenous contrast. Multiplanar reconstructed images and MIPs were  obtained and  reviewed to evaluate the vascular anatomy.  CONTRAST: 175mL OMNIPAQUE IOHEXOL 350 MG/ML SOLN  COMPARISON: CTA chest 07/24/2018.  FINDINGS:  CTA CHEST FINDINGS  Cardiovascular:  Heart size is normal. There is no significant  pericardial fluid, thickening or pericardial calcification. There is  aortic atherosclerosis, as well as atherosclerosis of the great  vessels of the mediastinum and the coronary arteries, including  calcified atherosclerotic plaque in the left main and left anterior  descending coronary arteries. Status post median sternotomy for  aortic root replacement with a stented bioprosthesis.  Mediastinum/Lymph Nodes: No pathologically enlarged mediastinal or  hilar lymph nodes. Esophagus is unremarkable in appearance. No  axillary lymphadenopathy.  Lungs/Pleura: No suspicious appearing pulmonary nodules or masses  are noted. No acute consolidative airspace disease. No pleural  effusions.  Musculoskeletal/Soft Tissues: Median sternotomy wires. There are no  aggressive appearing lytic or blastic lesions noted in the  visualized portions of the skeleton.  CTA ABDOMEN AND PELVIS FINDINGS  Hepatobiliary: No suspicious cystic or solid hepatic lesions. No  intra or extrahepatic biliary ductal dilatation. Status post  cholecystectomy.  Pancreas: No pancreatic mass. No pancreatic ductal dilatation. No  pancreatic or peripancreatic fluid collections or inflammatory  changes.  Spleen: Unremarkable.  Adrenals/Urinary Tract: Bilateral kidneys and bilateral adrenal  glands are normal in appearance. No hydroureteronephrosis. Urinary  bladder is normal in appearance.  Stomach/Bowel: Normal appearance of the stomach. No pathologic  dilatation of small bowel or colon. Numerous colonic diverticulae  are noted, without surrounding inflammatory changes to suggest an  acute diverticulitis at this time. Normal appendix.  Vascular/Lymphatic: Aortic atherosclerosis, without evidence of   aneurysm or dissection in the abdominal or pelvic vasculature.  Vascular findings and measurements pertinent to potential TAVR  procedure, as detailed below. No lymphadenopathy noted in the  abdomen or pelvis.  Reproductive: Prostate gland and seminal vesicles are unremarkable  in appearance.  Other: No significant volume of ascites. No pneumoperitoneum.  Musculoskeletal: There are no aggressive appearing lytic or blastic  lesions noted in the visualized portions of the skeleton.  VASCULAR MEASUREMENTS PERTINENT TO TAVR:  AORTA:  Minimal Aortic Diameter-17 x 18 mm  Severity of Aortic Calcification-mild  RIGHT PELVIS:  Right Common Iliac Artery -  Minimal Diameter-13.2 x 10.9 mm  Tortuosity-mild  Calcification-mild  Right External Iliac Artery -  Minimal Diameter-11.1 x 11.5 mm  Tortuosity-moderate  Calcification-minimal  Right Common Femoral Artery -  Minimal Diameter-12.1 x 10.5 mm  Tortuosity-mild  Calcification-mild  LEFT PELVIS:  Left Common Iliac Artery -  Minimal Diameter-12.4 x 10.3 mm  Tortuosity-mild  Calcification-mild  Left External Iliac Artery -  Minimal Diameter-12.4 x 12.0 mm  Tortuosity-moderate  Calcification-none  Left Common Femoral Artery -  Minimal Diameter-13.4 x 12.2 mm  Tortuosity-mild  Calcification-mild  Review of the MIP images confirms the above findings.  IMPRESSION:  1. Vascular findings and measurements pertinent to potential TAVR  procedure, as detailed above.  2. Aortic atherosclerosis, in addition to left main and left  anterior descending coronary artery disease.  3. Status post aortic valve replacement with stented bioprosthesis.  4. Colonic diverticulosis without evidence of acute diverticulitis  at this time.  5. Additional incidental findings, as above.  Electronically Signed  By: Vinnie Langton M.D.  On: 06/12/2019 11:08  STS RISK CALCULATOR:  Redo aortic valve replacement:  Risk of Mortality:  1.129%  Renal Failure:   1.326%  Permanent Stroke:  1.556%  Prolonged Ventilation:  4.238%  DSW Infection:  0.106%  Reoperation:  3.328%  Morbidity or Mortality:  7.595%  Short Length of Stay:  49.567%  Long Length of Stay:  3.426%    Impression:   This 73 year old  gentleman has stage D, severe, symptomatic prosthetic aortic stenosis New York Heart Association class II symptoms to moderate physical activity consistent with chronic diastolic congestive heart failure. Says that he feels fairly well overall and was 145 units very tired at the end of the day and has to stop frequently when doing physical activity. His aerobic physical activity is somewhat limited by his arthritis although he continues to be active doing things around the house and yard. I have personally reviewed his 2D echocardiogram, cardiac catheterization, and CTA studies. He has a 25 mm pericardial aortic valve prosthesis that is thickened with reduced leaflet mobility. The mean gradient has increased from 22 mmHg 1 year ago to 36.5 mmHg now. Left ventricular systolic function is normal. Cardiac catheterization shows no coronary disease. I agree that it is probably time to proceed with aortic valve replacement given the degree of progression of the past year. I think he will probably become progressively more symptomatic over the next 6 months if this is not treated. I think the operative risk for open surgical replacement morbidity significantly higher than that predicted by the STS risk calculator since he had a composite bioprosthesis/graft Bentall procedure. This would require redo root replacement. I think valve in valve TAVR would be the best option for treating this patient. He has a  25 mm Edwards Magna-Ease pericardial valve which can be replaced with 26 mm Sapien 3 valve. His gated cardiac CTA shows anatomy suitable for valve in valve TAVR. His abdominal and pelvic CTA shows adequate pelvic vascular anatomy to allow transfemoral insertion but  we could not get a wire across the valve. Therefore we will plan transapical insertion via a small left thoracotomy.  The patient and his wife were counseled at length regarding treatment alternatives for management of severe symptomatic aortic stenosis. The risks and benefits of surgical intervention has been discussed in detail. Long-term prognosis with medical therapy was discussed. Alternative approaches such as conventional surgical aortic valve replacement, transcatheter aortic valve replacement, and palliative medical therapy were compared and contrasted at length. This discussion was placed in the context of the patient's own specific clinical presentation and past medical history. All of their questions have been addressed.  Following the decision to proceed with transcatheter aortic valve replacement, a discussion was held regarding what types of management strategies would be attempted intraoperatively in the event of life-threatening complications, including whether or not the patient would be considered a candidate for the use of cardiopulmonary bypass and/or conversion to open sternotomy for attempted surgical intervention. I do not think he would be a candidate for emergent redo sternotomy to manage any intraoperative complications. The patient has been advised of a variety of complications that might develop including but not limited to risks of death, stroke, paravalvular leak, aortic dissection or other major vascular complications, aortic annulus rupture, device embolization, cardiac rupture or perforation, mitral regurgitation, acute myocardial infarction, arrhythmia, heart block or bradycardia requiring permanent pacemaker placement, congestive heart failure, respiratory failure, renal failure, pneumonia, infection, other late complications related to structural valve deterioration or migration, or other complications that might ultimately cause a temporary or permanent loss of functional  independence or other long term morbidity. The patient provides full informed consent for the procedure as described and all questions were answered.   Plan:   Transapical valve in valve TAVR using a Sapien 3 valve.   Gaye Pollack, MD

## 2019-07-16 NOTE — Anesthesia Preprocedure Evaluation (Signed)
Anesthesia Evaluation  Patient identified by MRN, date of birth, ID band Patient awake    Reviewed: Allergy & Precautions, NPO status , Patient's Chart, lab work & pertinent test results  Airway Mallampati: II  TM Distance: >3 FB Neck ROM: Full    Dental  (+) Teeth Intact, Dental Advisory Given   Pulmonary neg pulmonary ROS,    Pulmonary exam normal breath sounds clear to auscultation       Cardiovascular hypertension, Pt. on medications Normal cardiovascular exam+ dysrhythmias (Atrial tachycardia s/p ablation) + Valvular Problems/Murmurs (s/p AVR 2010) AS  Rhythm:Regular Rate:Normal  Echo 05/14/2019:  1. The left ventricle has normal systolic function with an ejection fraction of 60-65%. The cavity size was normal. There is moderately increased left ventricular wall thickness. Left ventricular diastolic Doppler parameters are consistent with impaired  relaxation.  2. The right ventricle has normal systolic function. The cavity was normal. There is no increase in right ventricular wall thickness.  3. Left atrial size was mild-moderately dilated.  4. The mitral valve is grossly normal.  5. The tricuspid valve is grossly normal.  6. Aortic valve regurgitation was not assessed by color flow Doppler. Severe stenosis of the aortic valve.  7. The patient has a bioprosthetic valve.  8. Aneurysm of the ascending aorta, measuring 46 mm.  9. The aorta is abnormal unless otherwise noted.   Neuro/Psych negative neurological ROS  negative psych ROS   GI/Hepatic Neg liver ROS, GERD  ,  Endo/Other  negative endocrine ROS  Renal/GU negative Renal ROS     Musculoskeletal  (+) Arthritis ,   Abdominal   Peds  Hematology negative hematology ROS (+)   Anesthesia Other Findings Day of surgery medications reviewed with the patient.  Reproductive/Obstetrics                             Lab Results  Component Value  Date   WBC 7.6 07/13/2019   HGB 16.9 07/13/2019   HCT 48.7 07/13/2019   MCV 94.4 07/13/2019   PLT 159 07/13/2019   Lab Results  Component Value Date   CREATININE 1.06 07/13/2019   BUN 13 07/13/2019   NA 141 07/13/2019   K 3.4 (L) 07/13/2019   CL 104 07/13/2019   CO2 28 07/13/2019    Anesthesia Physical  Anesthesia Plan  ASA: IV  Anesthesia Plan: General   Post-op Pain Management:    Induction: Intravenous  PONV Risk Score and Plan: 1 and Dexamethasone, Ondansetron and Treatment may vary due to age or medical condition  Airway Management Planned: Oral ETT  Additional Equipment: Arterial line, CVP, TEE and Ultrasound Guidance Line Placement  Intra-op Plan:   Post-operative Plan: Possible Post-op intubation/ventilation and Extubation in OR  Informed Consent: I have reviewed the patients History and Physical, chart, labs and discussed the procedure including the risks, benefits and alternatives for the proposed anesthesia with the patient or authorized representative who has indicated his/her understanding and acceptance.     Dental advisory given  Plan Discussed with: CRNA  Anesthesia Plan Comments:         Anesthesia Quick Evaluation

## 2019-07-17 ENCOUNTER — Inpatient Hospital Stay (HOSPITAL_COMMUNITY): Payer: PPO | Admitting: Vascular Surgery

## 2019-07-17 ENCOUNTER — Inpatient Hospital Stay (HOSPITAL_COMMUNITY): Payer: PPO

## 2019-07-17 ENCOUNTER — Inpatient Hospital Stay (HOSPITAL_COMMUNITY): Payer: PPO | Admitting: Anesthesiology

## 2019-07-17 ENCOUNTER — Encounter (HOSPITAL_COMMUNITY)
Admission: RE | Disposition: A | Discharge status: Home / Self Care | Payer: Self-pay | Source: Home / Self Care | Attending: Surgery

## 2019-07-17 ENCOUNTER — Inpatient Hospital Stay (HOSPITAL_COMMUNITY)
Admission: RE | Admit: 2019-07-17 | Discharge: 2019-07-19 | DRG: 267 | Disposition: A | Discharge status: Home / Self Care | Payer: PPO | Attending: Surgery | Admitting: Surgery

## 2019-07-17 ENCOUNTER — Other Ambulatory Visit: Payer: Self-pay | Admitting: Physician Assistant

## 2019-07-17 ENCOUNTER — Encounter (HOSPITAL_COMMUNITY): Payer: Self-pay | Admitting: Surgery

## 2019-07-17 ENCOUNTER — Other Ambulatory Visit: Payer: Self-pay

## 2019-07-17 DIAGNOSIS — T82857A Stenosis of cardiac prosthetic devices, implants and grafts, initial encounter: Principal | ICD-10-CM | POA: Diagnosis present

## 2019-07-17 DIAGNOSIS — Z20828 Contact with and (suspected) exposure to other viral communicable diseases: Secondary | ICD-10-CM | POA: Diagnosis present

## 2019-07-17 DIAGNOSIS — Z9689 Presence of other specified functional implants: Secondary | ICD-10-CM

## 2019-07-17 DIAGNOSIS — E785 Hyperlipidemia, unspecified: Secondary | ICD-10-CM | POA: Diagnosis present

## 2019-07-17 DIAGNOSIS — Z952 Presence of prosthetic heart valve: Secondary | ICD-10-CM

## 2019-07-17 DIAGNOSIS — N179 Acute kidney failure, unspecified: Secondary | ICD-10-CM | POA: Diagnosis present

## 2019-07-17 DIAGNOSIS — I5032 Chronic diastolic (congestive) heart failure: Secondary | ICD-10-CM | POA: Diagnosis not present

## 2019-07-17 DIAGNOSIS — Y831 Surgical operation with implant of artificial internal device as the cause of abnormal reaction of the patient, or of later complication, without mention of misadventure at the time of the procedure: Secondary | ICD-10-CM | POA: Diagnosis present

## 2019-07-17 DIAGNOSIS — Z006 Encounter for examination for normal comparison and control in clinical research program: Secondary | ICD-10-CM | POA: Diagnosis not present

## 2019-07-17 DIAGNOSIS — I110 Hypertensive heart disease with heart failure: Secondary | ICD-10-CM | POA: Diagnosis present

## 2019-07-17 DIAGNOSIS — Z9049 Acquired absence of other specified parts of digestive tract: Secondary | ICD-10-CM | POA: Diagnosis not present

## 2019-07-17 DIAGNOSIS — Z9889 Other specified postprocedural states: Secondary | ICD-10-CM

## 2019-07-17 DIAGNOSIS — E876 Hypokalemia: Secondary | ICD-10-CM | POA: Diagnosis not present

## 2019-07-17 DIAGNOSIS — I350 Nonrheumatic aortic (valve) stenosis: Secondary | ICD-10-CM | POA: Diagnosis not present

## 2019-07-17 DIAGNOSIS — I10 Essential (primary) hypertension: Secondary | ICD-10-CM | POA: Diagnosis not present

## 2019-07-17 DIAGNOSIS — Z8249 Family history of ischemic heart disease and other diseases of the circulatory system: Secondary | ICD-10-CM

## 2019-07-17 DIAGNOSIS — R0602 Shortness of breath: Secondary | ICD-10-CM | POA: Diagnosis not present

## 2019-07-17 DIAGNOSIS — Z4682 Encounter for fitting and adjustment of non-vascular catheter: Secondary | ICD-10-CM | POA: Diagnosis not present

## 2019-07-17 DIAGNOSIS — I77810 Thoracic aortic ectasia: Secondary | ICD-10-CM | POA: Diagnosis present

## 2019-07-17 DIAGNOSIS — Z8679 Personal history of other diseases of the circulatory system: Secondary | ICD-10-CM

## 2019-07-17 DIAGNOSIS — J9811 Atelectasis: Secondary | ICD-10-CM | POA: Diagnosis not present

## 2019-07-17 DIAGNOSIS — Z954 Presence of other heart-valve replacement: Secondary | ICD-10-CM | POA: Diagnosis not present

## 2019-07-17 DIAGNOSIS — K219 Gastro-esophageal reflux disease without esophagitis: Secondary | ICD-10-CM | POA: Diagnosis present

## 2019-07-17 HISTORY — PX: TRANSCATHETER AORTIC VALVE REPLACEMENT, TRANSAPICAL: SHX6401

## 2019-07-17 HISTORY — DX: Presence of prosthetic heart valve: Z95.2

## 2019-07-17 HISTORY — DX: Other specified postprocedural states: Z98.890

## 2019-07-17 HISTORY — PX: TEE WITHOUT CARDIOVERSION: SHX5443

## 2019-07-17 LAB — POCT I-STAT, CHEM 8
BUN: 10 mg/dL (ref 8–23)
BUN: 10 mg/dL (ref 8–23)
Calcium, Ion: 1.26 mmol/L (ref 1.15–1.40)
Calcium, Ion: 1.24 mmol/L (ref 1.15–1.40)
Chloride: 103 mmol/L (ref 98–111)
Chloride: 104 mmol/L (ref 98–111)
Creatinine, Ser: 0.8 mg/dL (ref 0.61–1.24)
Creatinine, Ser: 0.8 mg/dL (ref 0.61–1.24)
Glucose, Bld: 97 mg/dL (ref 70–99)
Glucose, Bld: 131 mg/dL — ABNORMAL HIGH (ref 70–99)
HCT: 40 % (ref 39.0–52.0)
HCT: 40 % (ref 39.0–52.0)
Hemoglobin: 13.6 g/dL (ref 13.0–17.0)
Hemoglobin: 13.6 g/dL (ref 13.0–17.0)
Potassium: 3.3 mmol/L — ABNORMAL LOW (ref 3.5–5.1)
Potassium: 3.6 mmol/L (ref 3.5–5.1)
Sodium: 142 mmol/L (ref 135–145)
Sodium: 142 mmol/L (ref 135–145)
TCO2: 27 mmol/L (ref 22–32)
TCO2: 23 mmol/L (ref 22–32)

## 2019-07-17 LAB — PREPARE RBC (CROSSMATCH)

## 2019-07-17 SURGERY — REPLACEMENT, AORTIC VALVE, TRANSAPICAL APPROACH
Anesthesia: General | Site: Chest

## 2019-07-17 MED ORDER — KETOROLAC TROMETHAMINE 15 MG/ML IJ SOLN: 15.0000 mg | Freq: Four times a day (QID) | INTRAMUSCULAR | Status: DC | PRN

## 2019-07-17 MED ORDER — HYDRALAZINE HCL 50 MG PO TABS
50.0000 mg | ORAL_TABLET | Freq: Four times a day (QID) | ORAL | Status: DC | PRN
  Administered 2019-07-17 – 2019-07-18 (×3): 50 mg via ORAL
  Filled 2019-07-17 (×3): qty 1

## 2019-07-17 MED ORDER — SODIUM CHLORIDE 0.9% FLUSH
3.0000 mL | Freq: Two times a day (BID) | INTRAVENOUS | Status: DC
  Administered 2019-07-17 – 2019-07-19 (×4): 3 mL via INTRAVENOUS

## 2019-07-17 MED ORDER — CHLORHEXIDINE GLUCONATE 4 % EX LIQD: 60.0000 mL | Freq: Once | CUTANEOUS | Status: DC

## 2019-07-17 MED ORDER — ROCURONIUM BROMIDE 50 MG/5ML IV SOSY
PREFILLED_SYRINGE | INTRAVENOUS | Status: DC | PRN
  Administered 2019-07-17 (×2): 50 mg via INTRAVENOUS

## 2019-07-17 MED ORDER — NITROGLYCERIN IN D5W 200-5 MCG/ML-% IV SOLN
0.0000 ug/min | INTRAVENOUS | Status: DC
  Administered 2019-07-17: 15 ug/min via INTRAVENOUS
  Administered 2019-07-18: 60 ug/min via INTRAVENOUS
  Filled 2019-07-17 (×2): qty 250

## 2019-07-17 MED ORDER — POTASSIUM CHLORIDE ER 10 MEQ PO TBCR
10.0000 meq | EXTENDED_RELEASE_TABLET | ORAL | Status: DC
  Administered 2019-07-17: 10 meq via ORAL
  Filled 2019-07-17 (×2): qty 1

## 2019-07-17 MED ORDER — ONDANSETRON HCL 4 MG/2ML IJ SOLN: 4.0000 mg | Freq: Four times a day (QID) | INTRAMUSCULAR | Status: DC | PRN

## 2019-07-17 MED ORDER — CHLORHEXIDINE GLUCONATE 0.12 % MT SOLN
OROMUCOSAL | Status: AC
  Administered 2019-07-17: 15 mL via OROMUCOSAL
  Filled 2019-07-17: qty 15

## 2019-07-17 MED ORDER — SODIUM CHLORIDE 0.9% FLUSH: 3.0000 mL | INTRAVENOUS | Status: DC | PRN

## 2019-07-17 MED ORDER — MIDAZOLAM HCL 5 MG/5ML IJ SOLN
INTRAMUSCULAR | Status: DC | PRN
  Administered 2019-07-17: 2 mg via INTRAVENOUS

## 2019-07-17 MED ORDER — FENTANYL CITRATE (PF) 100 MCG/2ML IJ SOLN
INTRAMUSCULAR | Status: DC | PRN
  Administered 2019-07-17 (×2): 50 ug via INTRAVENOUS
  Administered 2019-07-17: 100 ug via INTRAVENOUS
  Administered 2019-07-17 (×3): 50 ug via INTRAVENOUS

## 2019-07-17 MED ORDER — OXYCODONE HCL 5 MG PO TABS
5.0000 mg | ORAL_TABLET | ORAL | Status: DC | PRN
  Administered 2019-07-17: 10 mg via ORAL
  Administered 2019-07-17 – 2019-07-18 (×3): 5 mg via ORAL
  Administered 2019-07-18: 10 mg via ORAL
  Administered 2019-07-18: 5 mg via ORAL
  Administered 2019-07-19: 10 mg via ORAL
  Filled 2019-07-17: qty 1
  Filled 2019-07-17: qty 2
  Filled 2019-07-17: qty 1
  Filled 2019-07-17: qty 2
  Filled 2019-07-17 (×2): qty 1
  Filled 2019-07-17: qty 2

## 2019-07-17 MED ORDER — ACETAMINOPHEN 325 MG PO TABS: 650.0000 mg | ORAL_TABLET | Freq: Four times a day (QID) | ORAL | Status: DC | PRN

## 2019-07-17 MED ORDER — ACETAMINOPHEN 650 MG RE SUPP: 650.0000 mg | Freq: Four times a day (QID) | RECTAL | Status: DC | PRN

## 2019-07-17 MED ORDER — LIDOCAINE 2% (20 MG/ML) 5 ML SYRINGE
INTRAMUSCULAR | Status: AC
  Filled 2019-07-17: qty 5

## 2019-07-17 MED ORDER — PROPOFOL 10 MG/ML IV BOLUS
INTRAVENOUS | Status: AC
  Filled 2019-07-17: qty 20

## 2019-07-17 MED ORDER — ONDANSETRON HCL 4 MG/2ML IJ SOLN
INTRAMUSCULAR | Status: DC | PRN
  Administered 2019-07-17: 4 mg via INTRAVENOUS

## 2019-07-17 MED ORDER — TEMAZEPAM 15 MG PO CAPS
15.0000 mg | ORAL_CAPSULE | Freq: Every day | ORAL | Status: DC
  Administered 2019-07-17: 15 mg via ORAL
  Filled 2019-07-17: qty 1

## 2019-07-17 MED ORDER — SUGAMMADEX SODIUM 200 MG/2ML IV SOLN
INTRAVENOUS | Status: DC | PRN
  Administered 2019-07-17: 170 mg via INTRAVENOUS

## 2019-07-17 MED ORDER — SODIUM CHLORIDE 0.9 % IV SOLN
1.5000 g | Freq: Two times a day (BID) | INTRAVENOUS | Status: AC
  Administered 2019-07-17 – 2019-07-19 (×4): 1.5 g via INTRAVENOUS
  Filled 2019-07-17 (×4): qty 1.5

## 2019-07-17 MED ORDER — PHENYLEPHRINE HCL-NACL 20-0.9 MG/250ML-% IV SOLN: 0.0000 ug/min | INTRAVENOUS | Status: DC

## 2019-07-17 MED ORDER — IODIXANOL 320 MG/ML IV SOLN
INTRAVENOUS | Status: DC | PRN
  Administered 2019-07-17: .000001 mL via INTRA_ARTERIAL

## 2019-07-17 MED ORDER — CHLORHEXIDINE GLUCONATE 4 % EX LIQD: 30.0000 mL | CUTANEOUS | Status: DC

## 2019-07-17 MED ORDER — SODIUM CHLORIDE 0.9 % IV SOLN: 250.0000 mL | INTRAVENOUS | Status: DC | PRN

## 2019-07-17 MED ORDER — ROSUVASTATIN CALCIUM 5 MG PO TABS
10.0000 mg | ORAL_TABLET | Freq: Every evening | ORAL | Status: DC
  Administered 2019-07-17 – 2019-07-18 (×2): 10 mg via ORAL
  Filled 2019-07-17 (×2): qty 2

## 2019-07-17 MED ORDER — PROPOFOL 10 MG/ML IV BOLUS
INTRAVENOUS | Status: DC | PRN
  Administered 2019-07-17: 90 mg via INTRAVENOUS

## 2019-07-17 MED ORDER — SOTALOL HCL 80 MG PO TABS
40.0000 mg | ORAL_TABLET | Freq: Two times a day (BID) | ORAL | Status: DC
  Administered 2019-07-17 – 2019-07-19 (×5): 40 mg via ORAL
  Filled 2019-07-17 (×8): qty 0.5

## 2019-07-17 MED ORDER — SODIUM CHLORIDE 0.9 % IV SOLN: 10.0000 mL/h | Freq: Once | INTRAVENOUS | Status: DC

## 2019-07-17 MED ORDER — HEPARIN SODIUM (PORCINE) 1000 UNIT/ML IJ SOLN
INTRAMUSCULAR | Status: AC
  Filled 2019-07-17: qty 1

## 2019-07-17 MED ORDER — LIDOCAINE 2% (20 MG/ML) 5 ML SYRINGE
INTRAMUSCULAR | Status: DC | PRN
  Administered 2019-07-17: 60 mg via INTRAVENOUS

## 2019-07-17 MED ORDER — TRAMADOL HCL 50 MG PO TABS
50.0000 mg | ORAL_TABLET | ORAL | Status: DC | PRN
  Administered 2019-07-17 (×3): 50 mg via ORAL
  Filled 2019-07-17 (×3): qty 1

## 2019-07-17 MED ORDER — CHLORHEXIDINE GLUCONATE 0.12 % MT SOLN
15.0000 mL | Freq: Once | OROMUCOSAL | Status: AC
  Administered 2019-07-17: 06:00:00 15 mL via OROMUCOSAL

## 2019-07-17 MED ORDER — MORPHINE SULFATE (PF) 2 MG/ML IV SOLN
1.0000 mg | INTRAVENOUS | Status: DC | PRN
  Administered 2019-07-17: 2 mg via INTRAVENOUS
  Administered 2019-07-17: 4 mg via INTRAVENOUS
  Administered 2019-07-18: 2 mg via INTRAVENOUS
  Filled 2019-07-17 (×2): qty 1
  Filled 2019-07-17: qty 2

## 2019-07-17 MED ORDER — PANTOPRAZOLE SODIUM 40 MG PO TBEC
40.0000 mg | DELAYED_RELEASE_TABLET | Freq: Every day | ORAL | Status: DC
  Administered 2019-07-17 – 2019-07-19 (×3): 40 mg via ORAL
  Filled 2019-07-17 (×3): qty 1

## 2019-07-17 MED ORDER — LORATADINE 10 MG PO TABS
10.0000 mg | ORAL_TABLET | Freq: Every day | ORAL | Status: DC
  Administered 2019-07-17 – 2019-07-19 (×3): 10 mg via ORAL
  Filled 2019-07-17 (×3): qty 1

## 2019-07-17 MED ORDER — ASPIRIN EC 81 MG PO TBEC
81.0000 mg | DELAYED_RELEASE_TABLET | Freq: Every day | ORAL | Status: DC
  Administered 2019-07-17 – 2019-07-19 (×3): 81 mg via ORAL
  Filled 2019-07-17 (×3): qty 1

## 2019-07-17 MED ORDER — SODIUM CHLORIDE 0.9 % IV SOLN
INTRAVENOUS | Status: AC
  Administered 2019-07-17: 11:00:00 via INTRAVENOUS

## 2019-07-17 MED ORDER — SODIUM CHLORIDE 0.9 % IV SOLN: INTRAVENOUS | Status: DC

## 2019-07-17 MED ORDER — ONDANSETRON HCL 4 MG/2ML IJ SOLN
INTRAMUSCULAR | Status: AC
  Filled 2019-07-17: qty 2

## 2019-07-17 MED ORDER — FENTANYL CITRATE (PF) 250 MCG/5ML IJ SOLN
INTRAMUSCULAR | Status: AC
  Filled 2019-07-17: qty 5

## 2019-07-17 MED ORDER — CHLORHEXIDINE GLUCONATE CLOTH 2 % EX PADS
6.0000 | MEDICATED_PAD | Freq: Every day | CUTANEOUS | Status: DC
  Administered 2019-07-17 – 2019-07-19 (×2): 6 via TOPICAL

## 2019-07-17 MED ORDER — DEXAMETHASONE SODIUM PHOSPHATE 10 MG/ML IJ SOLN
INTRAMUSCULAR | Status: AC
  Filled 2019-07-17: qty 1

## 2019-07-17 MED ORDER — EPHEDRINE SULFATE-NACL 50-0.9 MG/10ML-% IV SOSY
PREFILLED_SYRINGE | INTRAVENOUS | Status: DC | PRN
  Administered 2019-07-17: 5 mg via INTRAVENOUS

## 2019-07-17 MED ORDER — HEPARIN SODIUM (PORCINE) 1000 UNIT/ML IJ SOLN
INTRAMUSCULAR | Status: DC | PRN
  Administered 2019-07-17: 3000 [IU] via INTRAVENOUS
  Administered 2019-07-17: 12000 [IU] via INTRAVENOUS

## 2019-07-17 MED ORDER — VANCOMYCIN HCL IN DEXTROSE 1-5 GM/200ML-% IV SOLN
1000.0000 mg | Freq: Once | INTRAVENOUS | Status: AC
  Administered 2019-07-17: 1000 mg via INTRAVENOUS
  Filled 2019-07-17: qty 200

## 2019-07-17 MED ORDER — SODIUM CHLORIDE 0.9 % IV SOLN
INTRAVENOUS | Status: AC
  Filled 2019-07-17 (×3): qty 1.2

## 2019-07-17 MED ORDER — TAMSULOSIN HCL 0.4 MG PO CAPS
0.4000 mg | ORAL_CAPSULE | Freq: Every day | ORAL | Status: DC
  Administered 2019-07-17 – 2019-07-18 (×2): 0.4 mg via ORAL
  Filled 2019-07-17 (×2): qty 1

## 2019-07-17 MED ORDER — SODIUM CHLORIDE 0.9 % IV SOLN
INTRAVENOUS | Status: DC | PRN
  Administered 2019-07-17: 1000 mL

## 2019-07-17 MED ORDER — 0.9 % SODIUM CHLORIDE (POUR BTL) OPTIME
TOPICAL | Status: DC | PRN
  Administered 2019-07-17: 2000 mL

## 2019-07-17 MED ORDER — LACTATED RINGERS IV SOLN
INTRAVENOUS | Status: DC | PRN
  Administered 2019-07-17 (×2): via INTRAVENOUS

## 2019-07-17 MED ORDER — DEXAMETHASONE SODIUM PHOSPHATE 10 MG/ML IJ SOLN
INTRAMUSCULAR | Status: DC | PRN
  Administered 2019-07-17: 10 mg via INTRAVENOUS

## 2019-07-17 MED ORDER — MIDAZOLAM HCL 2 MG/2ML IJ SOLN
INTRAMUSCULAR | Status: AC
  Filled 2019-07-17: qty 2

## 2019-07-17 MED ORDER — PROTAMINE SULFATE 10 MG/ML IV SOLN
INTRAVENOUS | Status: DC | PRN
  Administered 2019-07-17: 150 mg via INTRAVENOUS

## 2019-07-17 MED ORDER — ROCURONIUM BROMIDE 10 MG/ML (PF) SYRINGE
PREFILLED_SYRINGE | INTRAVENOUS | Status: AC
  Filled 2019-07-17: qty 10

## 2019-07-17 MED ORDER — CLOPIDOGREL BISULFATE 75 MG PO TABS
75.0000 mg | ORAL_TABLET | Freq: Every day | ORAL | Status: DC
  Administered 2019-07-17 – 2019-07-19 (×3): 75 mg via ORAL
  Filled 2019-07-17 (×3): qty 1

## 2019-07-17 SURGICAL SUPPLY — 116 items
ADAPTER UNIV SWAN GANZ BIP (ADAPTER) ×2 IMPLANT
ADAPTER UNV SWAN GANZ BIP (ADAPTER) ×2
ANTEGRADE CPLG (MISCELLANEOUS) IMPLANT
BAG DECANTER FOR FLEXI CONT (MISCELLANEOUS) ×4 IMPLANT
BAG SNAP BAND KOVER 36X36 (MISCELLANEOUS) ×8 IMPLANT
BLADE CLIPPER SURG (BLADE) ×4 IMPLANT
BLADE OSCILLATING /SAGITTAL (BLADE) IMPLANT
BLADE STERNUM SYSTEM 6 (BLADE) ×4 IMPLANT
CABLE ADAPT CONN TEMP 6FT (ADAPTER) ×4 IMPLANT
CANNULA FEM VENOUS REMOTE 22FR (CANNULA) IMPLANT
CANNULA OPTISITE PERFUSION 16F (CANNULA) IMPLANT
CANNULA OPTISITE PERFUSION 18F (CANNULA) IMPLANT
CANNULA SUMP PERICARDIAL (CANNULA) ×4 IMPLANT
CATH CROSS OVER TEMPO 5F (CATHETERS) ×4 IMPLANT
CATH DIAG EXPO 6F VENT PIG 145 (CATHETERS) IMPLANT
CATH EXTERNAL FEMALE PUREWICK (CATHETERS) IMPLANT
CATH INFINITI 6F AL2 (CATHETERS) ×4 IMPLANT
CATH S G BIP PACING (CATHETERS) ×4 IMPLANT
CLIP VESOCCLUDE MED 6/CT (CLIP) ×4 IMPLANT
CLIP VESOCCLUDE SM WIDE 6/CT (CLIP) ×4 IMPLANT
CONN ST 1/4X3/8  BEN (MISCELLANEOUS) ×2
CONN ST 1/4X3/8 BEN (MISCELLANEOUS) ×2 IMPLANT
CONT SPEC 4OZ CLIKSEAL STRL BL (MISCELLANEOUS) ×4 IMPLANT
COVER BACK TABLE 24X17X13 BIG (DRAPES) ×4 IMPLANT
COVER DOME SNAP 22 D (MISCELLANEOUS) ×4 IMPLANT
COVER SURGICAL LIGHT HANDLE (MISCELLANEOUS) ×4 IMPLANT
COVER WAND RF STERILE (DRAPES) ×4 IMPLANT
DERMABOND ADVANCED (GAUZE/BANDAGES/DRESSINGS) ×2
DERMABOND ADVANCED .7 DNX12 (GAUZE/BANDAGES/DRESSINGS) ×2 IMPLANT
DRAIN CHANNEL 28F RND 3/8 FF (WOUND CARE) ×4 IMPLANT
DRAPE INCISE IOBAN 66X45 STRL (DRAPES) ×4 IMPLANT
DRAPE SLUSH/WARMER DISC (DRAPES) IMPLANT
DRAPE SURG IRRIG POUCH 19X23 (DRAPES) ×8 IMPLANT
DRSG AQUACEL AG ADV 3.5X 6 (GAUZE/BANDAGES/DRESSINGS) ×4 IMPLANT
DRSG TEGADERM 4X4.75 (GAUZE/BANDAGES/DRESSINGS) ×4 IMPLANT
ELECT BLADE 6.5 EXT (BLADE) IMPLANT
ELECT CAUTERY BLADE 6.4 (BLADE) ×4 IMPLANT
ELECT REM PT RETURN 9FT ADLT (ELECTROSURGICAL) ×8
ELECTRODE REM PT RTRN 9FT ADLT (ELECTROSURGICAL) ×4 IMPLANT
FELT TEFLON 1X6 (MISCELLANEOUS) ×4 IMPLANT
FELT TEFLON 6X6 (MISCELLANEOUS) ×4 IMPLANT
GAUZE SPONGE 4X4 12PLY STRL (GAUZE/BANDAGES/DRESSINGS) ×4 IMPLANT
GAUZE SPONGE 4X4 12PLY STRL LF (GAUZE/BANDAGES/DRESSINGS) ×4 IMPLANT
GLOVE BIO SURGEON STRL SZ7.5 (GLOVE) ×8 IMPLANT
GLOVE BIO SURGEON STRL SZ8 (GLOVE) ×8 IMPLANT
GLOVE EUDERMIC 7 POWDERFREE (GLOVE) ×8 IMPLANT
GLOVE ORTHO TXT STRL SZ7.5 (GLOVE) ×8 IMPLANT
GOWN STRL REUS W/ TWL LRG LVL3 (GOWN DISPOSABLE) ×6 IMPLANT
GOWN STRL REUS W/ TWL XL LVL3 (GOWN DISPOSABLE) ×12 IMPLANT
GOWN STRL REUS W/TWL LRG LVL3 (GOWN DISPOSABLE) ×6
GOWN STRL REUS W/TWL XL LVL3 (GOWN DISPOSABLE) ×12
GUIDEWIRE SAF TJ AMPL .035X180 (WIRE) ×4 IMPLANT
GUIDEWIRE SAFE TJ AMPLATZ EXST (WIRE) ×4 IMPLANT
GUIDEWIRE WHOLEY .035 145 JTIP (WIRE) IMPLANT
HEMOSTAT POWDER SURGIFOAM 1G (HEMOSTASIS) IMPLANT
INSERT FOGARTY SM (MISCELLANEOUS) ×4 IMPLANT
KIT DILATOR VASC 18G NDL (KITS) IMPLANT
KIT HEART LEFT (KITS) ×8 IMPLANT
KIT SUCTION CATH 14FR (SUCTIONS) ×12 IMPLANT
KIT TURNOVER KIT B (KITS) ×4 IMPLANT
LEAD PACING MYOCARDI (MISCELLANEOUS) ×4 IMPLANT
LOOP VESSEL MAXI BLUE (MISCELLANEOUS) IMPLANT
LOOP VESSEL MINI RED (MISCELLANEOUS) IMPLANT
NEEDLE 22X1 1/2 (OR ONLY) (NEEDLE) ×4 IMPLANT
NEEDLE PERC 18GX7CM (NEEDLE) ×4 IMPLANT
NS IRRIG 1000ML POUR BTL (IV SOLUTION) ×20 IMPLANT
PACK ENDOVASCULAR (PACKS) ×4 IMPLANT
PAD ARMBOARD 7.5X6 YLW CONV (MISCELLANEOUS) ×8 IMPLANT
PAD ELECT DEFIB RADIOL ZOLL (MISCELLANEOUS) ×4 IMPLANT
PENCIL BUTTON HOLSTER BLD 10FT (ELECTRODE) ×4 IMPLANT
POSITIONER HEAD DONUT 9IN (MISCELLANEOUS) ×4 IMPLANT
RETRACTOR TRL SOFT TISSUE LG (INSTRUMENTS) IMPLANT
RETRACTOR TRM SOFT TISSUE 7.5 (INSTRUMENTS) IMPLANT
SET MICROPUNCTURE 5F STIFF (MISCELLANEOUS) ×4 IMPLANT
SHEATH BRITE TIP 7FR 35CM (SHEATH) ×4 IMPLANT
SHEATH PINNACLE 6F 10CM (SHEATH) ×4 IMPLANT
SHEATH PINNACLE 8F 10CM (SHEATH) ×4 IMPLANT
SLEEVE REPOSITIONING LENGTH 30 (MISCELLANEOUS) ×4 IMPLANT
SPONGE LAP 4X18 RFD (DISPOSABLE) ×4 IMPLANT
STOPCOCK MORSE 400PSI 3WAY (MISCELLANEOUS) ×8 IMPLANT
SUT BONE WAX W31G (SUTURE) ×4 IMPLANT
SUT ETHIBOND X763 2 0 SH 1 (SUTURE) ×8 IMPLANT
SUT PROLENE 2 0 MH 48 (SUTURE) ×12 IMPLANT
SUT PROLENE 3 0 SH DA (SUTURE) ×4 IMPLANT
SUT PROLENE 4 0 RB 1 (SUTURE) ×2
SUT PROLENE 4-0 RB1 .5 CRCL 36 (SUTURE) ×2 IMPLANT
SUT SILK  1 MH (SUTURE) ×2
SUT SILK 1 MH (SUTURE) ×2 IMPLANT
SUT SILK 2 0 SH CR/8 (SUTURE) ×4 IMPLANT
SUT VIC AB 1 CTX 36 (SUTURE) ×2
SUT VIC AB 1 CTX36XBRD ANBCTR (SUTURE) ×2 IMPLANT
SUT VIC AB 2 TP1 27 (SUTURE) ×4 IMPLANT
SUT VIC AB 2-0 CTX 36 (SUTURE) ×8 IMPLANT
SUT VIC AB 3-0 X1 27 (SUTURE) ×8 IMPLANT
SUT VICRYL 2 TP 1 (SUTURE) IMPLANT
SYR 3ML LL SCALE MARK (SYRINGE) ×4 IMPLANT
SYR 50ML LL SCALE MARK (SYRINGE) ×4 IMPLANT
SYR 5ML LL (SYRINGE) ×4 IMPLANT
SYR BULB IRRIGATION 50ML (SYRINGE) ×4 IMPLANT
SYSTEM SAHARA CHEST DRAIN ATS (WOUND CARE) ×4 IMPLANT
TOWEL GREEN STERILE (TOWEL DISPOSABLE) ×4 IMPLANT
TOWEL GREEN STERILE FF (TOWEL DISPOSABLE) ×4 IMPLANT
TOWEL OR NON WOVEN STRL DISP B (DISPOSABLE) ×4 IMPLANT
TRANSDUCER DISP STR W/STOPCOCK (MISCELLANEOUS) ×4 IMPLANT
TRANSDUCER W/STOPCOCK (MISCELLANEOUS) ×8 IMPLANT
TRAY FOLEY SLVR 16FR TEMP STAT (SET/KITS/TRAYS/PACK) ×4 IMPLANT
TUBING ART PRESS 72  MALE/FEM (TUBING) ×2
TUBING ART PRESS 72 MALE/FEM (TUBING) ×2 IMPLANT
TUBING HIGH PRESSURE 120CM (CONNECTOR) IMPLANT
VALVE HRT TRANSCATH CERT 26MM (Valve) ×4 IMPLANT
WIRE AMPLATZ SS-J .035X180CM (WIRE) ×4 IMPLANT
WIRE BENTSON .035X145CM (WIRE) IMPLANT
WIRE EMERALD 3MM-J .035X150CM (WIRE) ×4 IMPLANT
WIRE EMERALD 3MM-J .035X260CM (WIRE) ×4 IMPLANT
WIRE EMERALD ST .035X260CM (WIRE) ×4 IMPLANT
YANKAUER SUCT BULB TIP NO VENT (SUCTIONS) IMPLANT

## 2019-07-17 NOTE — Op Note (Signed)
STRUCTURAL HEART OPERATIVE NOTE  Date of Procedure:  07/17/2019  Preoperative Diagnosis: Severe Aortic Stenosis   Postoperative Diagnosis: Same   Procedure:    Transcatheter Aortic Valve Replacement (Valve in Valve) - Transapical Approach  Edwards Sapien 3 THV (size 26 mm, model # 9600TFX, serial # [id number])   Co-Surgeons:  Gilford Raid, MD and Sherren Mocha, MD  Anesthesiologist:  Suzette Battiest, MD  Echocardiographer:  Ena Dawley, MD  Pre-operative Echo Findings:  Severe bioprosthetic aortic stenosis  Normal left ventricular systolic function  Post-operative Echo Findings:  No paravalvular leak  Normal/unchanged left ventricular systolic function  BRIEF CLINICAL NOTE AND INDICATIONS FOR SURGERY  73 year old gentleman with history of aortic valve disease status post bioprosthetic Bentall procedure in 2010 with a 25 mm Edwards magna ease pericardial tissue valve inside a Gel[place]ave Valsalva graft.  The patient has developed severe symptomatic bioprosthetic aortic stenosis with peak and mean transvalvular gradients of 60 and 37 mmHg, respectively.  After multidisciplinary heart team evaluation, he presented July 03, 2019 with plans for transfemoral TAVR.  Please see those notes for full details.  After a prolonged attempt, we were not able to cross the aortic valve in retrograde fashion and his TAVR procedure had to be aborted because of inability to cross the severely stenosed bioprosthesis.  He returns today for TAVR via planned transapical approach.  During the course of the patient's preoperative work up they have been evaluated comprehensively by a multidisciplinary team of specialists coordinated through the [facility] in the [facility].  They have been demonstrated to suffer from symptomatic severe aortic stenosis as noted above. The patient has been counseled extensively as to the relative risks and benefits of  all options for the treatment of severe aortic stenosis including long term medical therapy, conventional surgery for aortic valve replacement, and transcatheter aortic valve replacement.  The patient has been independently evaluated by Dr Cyndia Bent they are felt to be at high risk for conventional surgical aortic valve replacement.  Based upon review of all of the patient's preoperative diagnostic tests they are felt to be candidate for transcatheter aortic valve replacement using the transapical approach as an alternative to high risk conventional surgery.    Following the decision to proceed with transcatheter aortic valve replacement, a discussion has been held regarding what types of management strategies would be attempted intraoperatively in the event of life-threatening complications, including whether or not the patient would be considered a candidate for the use of cardiopulmonary bypass and/or conversion to open sternotomy for attempted surgical intervention.  The patient has been advised of a variety of complications that might develop peculiar to this approach including but not limited to risks of death, stroke, paravalvular leak, aortic dissection or other major vascular complications, aortic annulus rupture, device embolization, cardiac rupture or perforation, acute myocardial infarction, arrhythmia, heart block or bradycardia requiring permanent pacemaker placement, congestive heart failure, respiratory failure, renal failure, pneumonia, infection, other late complications related to structural valve deterioration or migration, or other complications that might ultimately cause a temporary or permanent loss of functional independence or other long term morbidity.  The patient provides full informed consent for the procedure as described and all questions were answered preoperatively.    DETAILS OF THE OPERATIVE PROCEDURE  PREPARATION:    The patient is brought to the operating room on the  above mentioned date and central monitoring was established by the anesthesia team including placement of a radial arterial line. The  patient is placed in the supine position on the operating table.  Intravenous antibiotics are administered. General endotracheal anesthesia is induced uneventfully.   Baseline transesophageal echocardiogram was performed. The patient's chest, abdomen, both groins, and both lower extremities are prepared and draped in a sterile manner. A time out procedure is performed.   PERIPHERAL ACCESS:    Femoral venous access is obtained under direct US guidance with placement of 7 long venous sheath on the right side. A temporary transvenous pacemaker catheter is passed through the right femoral venous sheath under fluoroscopic guidance into the right ventricle.  The pacemaker is tested to ensure stable lead placement and pacemaker capture.   TRANSAPICAL ACCESS:   Please see the complete note of Dr Cyndia Bent for details.   The patient is heparinized systemically and ACT verified > 250 seconds.  The left ventricular apex is punctured using an 18 gauge needle and a soft J-tipped guidewire is passed into the left ventricle and through the aortic valve under fluoroscopic guidance.  A 6 Fr sheath is placed over the guidewire and across the aortic valve.  A crossover catheter is passed through the sheath and maneuvered around the aortic arch into the descending thoracic aorta under fluoroscopic guidance.  An Amplatz extra stiff guidewire is passed through thecrossover catheter into the descending thoracic aorta and both the crossover catheter and the introducing sheath are removed.  An [place] introducer sheath is passed over the guidewire into the left ventricle.  The sheath position is continuously monitored and secured by Dr Cyndia Bent.     BALLOON AORTIC VALVULOPLASTY:  Not performed   TRANSCATHETER HEART VALVE DEPLOYMENT:  An Edwards Sapien 3 transcatheter  heart valve (size 26 mm serial [id number]) is prepared and crimped per manufacturer's guidelines, and the proper orientation of the valve is confirmed on the [place] [employer] delivery system.  The delivery system loader is advanced into the introducing sheath.  The valve and delivery system are advanced through the loader into the sheath and all air is evacuated.  The valve and balloon are advanced into the left ventricle and part way through the aortic valve. The valve is then finely positioned in the indwelling aortic stented bioprosthesis.  Once final position of the valve has been confirmed, the valve is deployed during rapid ventricular pacing to maintain systolic blood pressure < 50 mmHg and pulse pressure < 10 mmHg.  The balloon inflation is held for >3 seconds after reaching full deployment volume.  Once the balloon has fully deflated the balloon is retracted into the left ventricle and valve function is assessed using TEE.   There is felt to be no paravalvular leak and no central aortic insufficiency.  Left ventricular function is  unchanged from preoperatively.  There is trace mitral regurgitation.  The patient's hemodynamic recovery following valve deployment is good.  PROCEDURE COMPLETION:  The left ventricular sheath is removed during rapid ventricular pacing while the pursestring sutures are tied.  Protamine is administered.    Please see Dr Vivi Martens note for further details. The temporary pacemaker and all femoral venous sheath are removed after administration of protamine.  The patient tolerated the procedure well and is transported to the surgical intensive care in stable condition. There are no intraoperative complications. All sponge instrument and needle counts are verified correct at completion of the operation.  No blood products were administered during the operation.  The patient received a total of 0 mL of intravenous contrast during the procedure.  Juanda Bond  Burt Knack,  MD 07/17/2019 9:51 AM

## 2019-07-17 NOTE — Progress Notes (Signed)
  Echocardiogram Echocardiogram Transesophageal has been performed.  Randall Edwards 07/17/2019, 10:12 AM

## 2019-07-17 NOTE — Anesthesia Procedure Notes (Signed)
Central Venous Catheter Insertion Performed by: Suzette Battiest, MD, anesthesiologist Start/End11/11/2018 7:07 AM, 07/17/2019 7:22 AM Patient location: Pre-op. Preanesthetic checklist: patient identified, IV checked, site marked, risks and benefits discussed, surgical consent, monitors and equipment checked, pre-op evaluation, timeout performed and anesthesia consent Position: Trendelenburg Lidocaine 1% used for infiltration and patient sedated Hand hygiene performed , maximum sterile barriers used  and Seldinger technique used Catheter size: 8.5 Fr Total catheter length 10. Central line was placed.Sheath introducer Swan type:thermodilution Procedure performed using ultrasound guided technique. Ultrasound Notes:anatomy identified, needle tip was noted to be adjacent to the nerve/plexus identified, no ultrasound evidence of intravascular and/or intraneural injection and image(s) printed for medical record Attempts: 1 Following insertion, line sutured, dressing applied and Biopatch. Post procedure assessment: blood return through all ports, free fluid flow and no air  Patient tolerated the procedure well with no immediate complications.

## 2019-07-17 NOTE — Transfer of Care (Signed)
Immediate Anesthesia Transfer of Care Note  Patient: Randall Edwards  Procedure(s) Performed: TRANSCATHETER AORTIC VALVE REPLACEMENT, TRANSAPICAL (N/A Chest) TRANSESOPHAGEAL ECHOCARDIOGRAM (TEE) (N/A )  Patient Location: ICU  Anesthesia Type:General  Level of Consciousness: drowsy and patient cooperative  Airway & Oxygen Therapy: Patient Spontanous Breathing and Patient connected to face mask oxygen  Post-op Assessment: Report given to RN and Post -op Vital signs reviewed and stable  Post vital signs: Reviewed and stable  Last Vitals:  Vitals Value Taken Time  BP 144/91 07/17/19 1047  Temp    Pulse 88 07/17/19 1055  Resp 20 07/17/19 1055  SpO2 96 % 07/17/19 1055  Vitals shown include unvalidated device data.  Last Pain:  Vitals:   07/17/19 0614  TempSrc:   PainSc: 0-No pain      Patients Stated Pain Goal: 2 ([id number])  Complications: No apparent anesthesia complications

## 2019-07-17 NOTE — Progress Notes (Signed)
Patient ID: Randall Edwards, male   DOB: 1945-11-21, 73 y.o.   MRN: [id number] TCTS:  Hemodynamically stable in sinus rhythm. On NTG 40 mcg for HTN.  Awake and alert. Pain under control  Chest tube output low  Has not urinated since foley removed postop.  Continue present plan. Keep MAP< 90 with prn NTG and hydralazine.

## 2019-07-17 NOTE — Anesthesia Procedure Notes (Signed)
Arterial Line Insertion Start/End11/11/2018 7:25 AM, 07/17/2019 7:29 AM Performed by: Suzette Battiest, MD  Patient location: Pre-op. Preanesthetic checklist: patient identified, IV checked, site marked, risks and benefits discussed, surgical consent, monitors and equipment checked, pre-op evaluation, timeout performed and anesthesia consent Lidocaine 1% used for infiltration Right, radial was placed Catheter size: 20 Fr Hand hygiene performed  and maximum sterile barriers used   Attempts: 1 Procedure performed using ultrasound guided technique. Ultrasound Notes:anatomy identified, needle tip was noted to be adjacent to the nerve/plexus identified, no ultrasound evidence of intravascular and/or intraneural injection and image(s) printed for medical record Following insertion, dressing applied and Biopatch. Post procedure assessment: normal and unchanged  Patient tolerated the procedure well with no immediate complications.

## 2019-07-17 NOTE — Plan of Care (Signed)
  Problem: Clinical Measurements: Goal: Ability to maintain clinical measurements within normal limits will improve Outcome: Progressing   Problem: Education: Goal: Knowledge of General Education information will improve Description: Including pain rating scale, medication(s)/side effects and non-pharmacologic comfort measures Outcome: Progressing   Problem: Health Behavior/Discharge Planning: Goal: Ability to manage health-related needs will improve Outcome: Progressing   Problem: Clinical Measurements: Goal: Ability to maintain clinical measurements within normal limits will improve Outcome: Progressing Goal: Will remain free from infection Outcome: Progressing Goal: Diagnostic test results will improve Outcome: Progressing Goal: Respiratory complications will improve Outcome: Progressing Goal: Cardiovascular complication will be avoided Outcome: Progressing   Problem: Activity: Goal: Risk for activity intolerance will decrease Outcome: Progressing   Problem: Nutrition: Goal: Adequate nutrition will be maintained Outcome: Progressing   Problem: Coping: Goal: Level of anxiety will decrease Outcome: Progressing   Problem: Elimination: Goal: Will not experience complications related to bowel motility Outcome: Progressing Goal: Will not experience complications related to urinary retention Outcome: Progressing   Problem: Pain Managment: Goal: General experience of comfort will improve Outcome: Progressing   Problem: Safety: Goal: Ability to remain free from injury will improve Outcome: Progressing   Problem: Skin Integrity: Goal: Risk for impaired skin integrity will decrease Outcome: Progressing

## 2019-07-17 NOTE — Progress Notes (Signed)
      [address]       [place] [address]             [phone]      Up in chair Denies pain and shortness of breath BP 102/71   Pulse (!) 114   Temp 97.6 F (36.4 C) (Oral)   Resp (!) 22   Ht 5\' 9"  (1.753 m)   Wt 82.7 kg   SpO2 (!) 88%   BMI 26.92 kg/m  89% on HFNC  CXR oredered by Dr. Burt Knack. Earlier film showed atelectasis Will add flutter valve, may do better with ventimask if needed  Remo Lipps C. Roxan Hockey, MD [facility] [phone]

## 2019-07-17 NOTE — Anesthesia Procedure Notes (Signed)
Procedure Name: Intubation Date/Time: 07/17/2019 7:54 AM Performed by: Lance Coon, CRNA Pre-anesthesia Checklist: Patient identified, Emergency Drugs available, Suction available, Patient being monitored and Timeout performed Patient Re-evaluated:Patient Re-evaluated prior to induction Oxygen Delivery Method: Circle system utilized Preoxygenation: Pre-oxygenation with 100% oxygen Induction Type: IV induction Ventilation: Mask ventilation without difficulty Laryngoscope Size: Miller and 3 Grade View: Grade I Tube type: Oral Tube size: 7.5 mm Number of attempts: 1 Airway Equipment and Method: Stylet Placement Confirmation: ETT inserted through vocal cords under direct vision,  positive ETCO2 and breath sounds checked- equal and bilateral Secured at: 22 cm Tube secured with: Tape Dental Injury: Teeth and Oropharynx as per pre-operative assessment

## 2019-07-17 NOTE — Interval H&P Note (Signed)
History and Physical Interval Note:  07/17/2019 7:04 AM  Randall Edwards  has presented today for surgery, with the diagnosis of Severe Aortic Stenosis.  The various methods of treatment have been discussed with the patient and family. After consideration of risks, benefits and other options for treatment, the patient has consented to  Procedure(s): TRANSCATHETER AORTIC VALVE REPLACEMENT, TRANSAPICAL (N/A) TRANSESOPHAGEAL ECHOCARDIOGRAM (TEE) (N/A) as a surgical intervention.  The patient's history has been reviewed, patient examined, no change in status, stable for surgery.  I have reviewed the patient's chart and labs.  Questions were answered to the patient's satisfaction.     Gaye Pollack

## 2019-07-17 NOTE — Progress Notes (Addendum)
  [facility]  Patient doing well s/p TAVR. He is hemodynamically stable, but on nitro gtt. Groin sites stable. ECG with sinus and new LBBB/1st deg AV block but no high grade block. Chest tube in place and draining. Patient in pain and requesting pain meds. Will order PRN hydralazine to try to get off nitro gtt.   Angelena Form PA-C  [facility]  Pager [phone]

## 2019-07-17 NOTE — Discharge Instructions (Signed)
ACTIVITY AND EXERCISE °• Daily activity and exercise are an important part of your recovery. People recover at different rates depending on their general health and type of valve procedure. °• Most people recovering from TAVR feel better relatively quickly  °• No lifting, pushing, pulling more than 10 pounds (examples to avoid: groceries, vacuuming, gardening, golfing): °            - For one week with a procedure through the groin. °            - For six weeks for procedures through the chest wall or neck. °NOTE: You will typically see one of our providers 7-14 days after your procedure to discuss WHEN TO RESUME the above activities.  °  °  °DRIVING °• Do not drive until you are seen for follow up and cleared by a provider. Generally, we ask patient to not drive for 1 week after their procedure. °• If you have been told by your doctor in the past that you may not drive, you must talk with him/her before you begin driving again. °  °DRESSING °• Groin site: you may leave the clear dressing over the site for up to one week or until it falls off. °  °HYGIENE °• If you had a femoral (leg) procedure, you may take a shower when you return home. After the shower, pat the site dry. Do NOT use powder, oils or lotions in your groin area until the site has completely healed. °• If you had a chest procedure, you may shower when you return home unless specifically instructed not to by your discharging practitioner. °            - DO NOT scrub incision; pat dry with a towel. °            - DO NOT apply any lotions, oils, powders to the incision. °            - No tub baths / swimming for at least 2 weeks. °• If you notice any fevers, chills, increased pain, swelling, bleeding or pus, please contact your doctor. °  °ADDITIONAL INFORMATION °• If you are going to have an upcoming dental procedure, please contact our office as you will require antibiotics ahead of time to prevent infection on your heart valve.  ° ° °If you have any  questions or concerns you can call the structural heart phone during normal business hours 8am-4pm. If you have an urgent need after hours or weekends please call [phone] to talk to the on call provider for general cardiology. If you have an emergency that requires immediate attention, please call 911.  ° ° °After TAVR Checklist ° °Check  Test Description  ° Follow up appointment in 1-2 weeks  You will see our structural heart physician assistant, Randall Edwards. Your incision sites will be checked and you will be cleared to drive and resume all normal activities if you are doing well.    ° 1 month echo and follow up  You will have an echo to check on your new heart valve and be seen back in the office by Randall Edwards. Many times the echo is not read by your appointment time, but Randall will call you later that day or the following day to report your results.  ° Follow up with your primary cardiologist You will need to be seen by your primary cardiologist in the following 3-6 months after your 1 month appointment in the valve   clinic. Often times your Plavix or Aspirin will be discontinued during this time, but this is decided on a case by case basis.   ° 1 year echo and follow up You will have another echo to check on your heart valve after 1 year and be seen back in the office by Randall Edwards. This your last structural heart visit.  ° Bacterial endocarditis prophylaxis  You will have to take antibiotics for the rest of your life before all dental procedures (even teeth cleanings) to protect your heart valve. Antibiotics are also required before some surgeries. Please check with your cardiologist before scheduling any surgeries. Also, please make sure to tell us if you have a penicillin allergy as you will require an alternative antibiotic.   ° ° °

## 2019-07-18 ENCOUNTER — Inpatient Hospital Stay (HOSPITAL_COMMUNITY): Payer: PPO

## 2019-07-18 ENCOUNTER — Encounter (HOSPITAL_COMMUNITY): Payer: Self-pay | Admitting: Cardiovascular Disease

## 2019-07-18 DIAGNOSIS — I350 Nonrheumatic aortic (valve) stenosis: Secondary | ICD-10-CM

## 2019-07-18 DIAGNOSIS — Z954 Presence of other heart-valve replacement: Secondary | ICD-10-CM

## 2019-07-18 DIAGNOSIS — Z952 Presence of prosthetic heart valve: Secondary | ICD-10-CM

## 2019-07-18 LAB — BASIC METABOLIC PANEL
Anion gap: 11 (ref 5–15)
BUN: 18 mg/dL (ref 8–23)
CO2: 22 mmol/L (ref 22–32)
Calcium: 9.3 mg/dL (ref 8.9–10.3)
Chloride: 105 mmol/L (ref 98–111)
Creatinine, Ser: 1.49 mg/dL — ABNORMAL HIGH (ref 0.61–1.24)
GFR calc Af Amer: 54 mL/min — ABNORMAL LOW (ref >60)
GFR calc non Af Amer: 46 mL/min — ABNORMAL LOW (ref >60)
Glucose, Bld: 133 mg/dL — ABNORMAL HIGH (ref 70–99)
Potassium: 4.2 mmol/L (ref 3.5–5.1)
Sodium: 138 mmol/L (ref 135–145)

## 2019-07-18 LAB — ECHOCARDIOGRAM COMPLETE
Height: 69 in
Weight: 3026.47 oz

## 2019-07-18 LAB — CBC
HCT: 40.2 % (ref 39.0–52.0)
Hemoglobin: 14 g/dL (ref 13.0–17.0)
MCH: 32.6 pg (ref 26.0–34.0)
MCHC: 34.8 g/dL (ref 30.0–36.0)
MCV: 93.7 fL (ref 80.0–100.0)
Platelets: 144 10*3/uL — ABNORMAL LOW (ref 150–400)
RBC: 4.29 MIL/uL (ref 4.22–5.81)
RDW: 12.5 % (ref 11.5–15.5)
WBC: 14 10*3/uL — ABNORMAL HIGH (ref 4.0–10.5)
nRBC: 0 % (ref 0.0–0.2)

## 2019-07-18 LAB — POCT I-STAT, CHEM 8
BUN: 11 mg/dL (ref 8–23)
Calcium, Ion: 1.28 mmol/L (ref 1.15–1.40)
Chloride: 106 mmol/L (ref 98–111)
Creatinine, Ser: 0.8 mg/dL (ref 0.61–1.24)
Glucose, Bld: 138 mg/dL — ABNORMAL HIGH (ref 70–99)
HCT: 42 % (ref 39.0–52.0)
Hemoglobin: 14.3 g/dL (ref 13.0–17.0)
Potassium: 3.7 mmol/L (ref 3.5–5.1)
Sodium: 142 mmol/L (ref 135–145)
TCO2: 25 mmol/L (ref 22–32)

## 2019-07-18 LAB — MAGNESIUM: Magnesium: 1.8 mg/dL (ref 1.7–2.4)

## 2019-07-18 MED ORDER — ALBUTEROL SULFATE (2.5 MG/3ML) 0.083% IN NEBU
2.5000 mg | INHALATION_SOLUTION | Freq: Three times a day (TID) | RESPIRATORY_TRACT | Status: DC
  Administered 2019-07-18 – 2019-07-19 (×3): 2.5 mg via RESPIRATORY_TRACT
  Filled 2019-07-18 (×3): qty 3

## 2019-07-18 MED ORDER — TRAMADOL HCL 50 MG PO TABS: 50.0000 mg | ORAL_TABLET | ORAL | Status: DC | PRN

## 2019-07-18 MED ORDER — AMLODIPINE BESYLATE 10 MG PO TABS
10.0000 mg | ORAL_TABLET | Freq: Every day | ORAL | Status: DC
  Administered 2019-07-18 – 2019-07-19 (×2): 10 mg via ORAL
  Filled 2019-07-18 (×2): qty 1

## 2019-07-18 MED ORDER — SIMETHICONE 80 MG PO CHEW
80.0000 mg | CHEWABLE_TABLET | Freq: Four times a day (QID) | ORAL | Status: DC
  Administered 2019-07-18 – 2019-07-19 (×3): 80 mg via ORAL
  Filled 2019-07-18 (×3): qty 1

## 2019-07-18 MED ORDER — TEMAZEPAM 15 MG PO CAPS
30.0000 mg | ORAL_CAPSULE | Freq: Every day | ORAL | Status: DC
  Administered 2019-07-18: 30 mg via ORAL
  Filled 2019-07-18: qty 2

## 2019-07-18 MED ORDER — ALBUTEROL SULFATE (2.5 MG/3ML) 0.083% IN NEBU
2.5000 mg | INHALATION_SOLUTION | Freq: Four times a day (QID) | RESPIRATORY_TRACT | Status: DC
  Administered 2019-07-18: 2.5 mg via RESPIRATORY_TRACT
  Filled 2019-07-18: qty 3

## 2019-07-18 MED ORDER — ALUM & MAG HYDROXIDE-SIMETH 200-200-20 MG/5ML PO SUSP
15.0000 mL | Freq: Four times a day (QID) | ORAL | Status: DC | PRN
  Administered 2019-07-19: 15 mL via ORAL
  Filled 2019-07-18: qty 30

## 2019-07-18 MED ORDER — CALCIUM CARBONATE ANTACID 500 MG PO CHEW
400.0000 mg | CHEWABLE_TABLET | ORAL | Status: DC | PRN
  Administered 2019-07-18 – 2019-07-19 (×3): 400 mg via ORAL
  Filled 2019-07-18 (×3): qty 2

## 2019-07-18 NOTE — Anesthesia Postprocedure Evaluation (Signed)
Anesthesia Post Note  Patient: NYAL EBAUGH  Procedure(s) Performed: TRANSCATHETER AORTIC VALVE REPLACEMENT, TRANSAPICAL (N/A Chest) TRANSESOPHAGEAL ECHOCARDIOGRAM (TEE) (N/A )     Patient location during evaluation: PACU Anesthesia Type: General Level of consciousness: awake and alert Pain management: pain level controlled Vital Signs Assessment: post-procedure vital signs reviewed and stable Respiratory status: spontaneous breathing, nonlabored ventilation, respiratory function stable and patient connected to nasal cannula oxygen Cardiovascular status: blood pressure returned to baseline and stable Postop Assessment: no apparent nausea or vomiting Anesthetic complications: no    Last Vitals:  Vitals:   07/18/19 0800 07/18/19 0830  BP: 102/81 130/90  Pulse: 71 68  Resp: (!) 24 (!) 24  Temp:    SpO2: 94% 91%    Last Pain:  Vitals:   07/18/19 0900  TempSrc:   PainSc: 5                  Tiajuana Amass

## 2019-07-18 NOTE — Progress Notes (Signed)
1 Day Post-Op Procedure(s) (LRB): TRANSCATHETER AORTIC VALVE REPLACEMENT, TRANSAPICAL (N/A) TRANSESOPHAGEAL ECHOCARDIOGRAM (TEE) (N/A) Subjective: Sore but otherwise ok  Objective: Vital signs in last 24 hours: Temp:  [97.6 F (36.4 C)-97.7 F (36.5 C)] 97.6 F (36.4 C) (11/04 0340) Pulse Rate:  [63-117] 64 (11/04 0600) Cardiac Rhythm: Normal sinus rhythm (11/04 0400) Resp:  [13-30] 24 (11/04 0600) BP: (95-144)/(69-88) 108/81 (11/04 0600) SpO2:  [85 %-96 %] 93 % (11/04 0600) Arterial Line BP: (99-151)/(58-84) 134/77 (11/04 0600) Weight:  [82.7 kg-85.8 kg] 85.8 kg (11/04 0500)  Hemodynamic parameters for last 24 hours:    Intake/Output from previous day: 11/03 0701 - 11/04 0700 In: 2158.4 [P.O.:120; I.V.:1738.3; IV Piggyback:300.1] Out: 820 [Urine:350; Blood:50; Chest Tube:420] Intake/Output this shift: No intake/output data recorded.  General appearance: alert and cooperative Neurologic: intact Heart: regular rate and rhythm, S1, S2 normal, no murmur, click, rub or gallop Lungs: diminished breath sounds bibasilar Extremities: extremities normal, atraumatic, no cyanosis or edema Wound: dressing dry  Lab Results: Recent Labs    07/17/19 1105 07/18/19 0345  WBC  --  14.0*  HGB 14.3 14.0  HCT 42.0 40.2  PLT  --  144*   BMET:  Recent Labs    07/17/19 1105 07/18/19 0345  NA 142 138  K 3.7 4.2  CL 106 105  CO2  --  22  GLUCOSE 138* 133*  BUN 11 18  CREATININE 0.80 1.49*  CALCIUM  --  9.3    PT/INR: No results for input(s): LABPROT, INR in the last 72 hours. ABG    Component Value Date/Time   PHART 7.470 (H) 07/13/2019 1355   HCO3 23.6 07/13/2019 1355   TCO2 25 07/17/2019 1105   O2SAT 97.3 07/13/2019 1355   CBG (last 3)  No results for input(s): GLUCAP in the last 72 hours.  CXR: left basilar atelectasis  Assessment/Plan: S/P Procedure(s) (LRB): TRANSCATHETER AORTIC VALVE REPLACEMENT, TRANSAPICAL (N/A) TRANSESOPHAGEAL ECHOCARDIOGRAM (TEE)  (N/A)  POD 1 He has been mildly hypertensive requiring IV NTG. Sinus rhythm 68 on Sotolol. Will start him on Norvasc for BP control which is important after trans-apical insertion. Hold off on his previous diuretic with jump in creat postop. I think his creat increased due to Toradol since he did not get any contrast. Will repeat BMET in am.  DC chest tube, central line, arterial line.  IS, ambulation.   Consider transfer to 4E later today once off NTG.  2D echo today.   LOS: 1 day    Gaye Pollack 07/18/2019

## 2019-07-18 NOTE — Progress Notes (Signed)
CT surgery p.m. rounds  Breathing comfortably, vital signs stable Abdomen moderately distended, tympanic but not tender  will order Mylicon Incision clean and dry, chest tube removed Normal sinus rhythm

## 2019-07-18 NOTE — Progress Notes (Signed)
Echocardiogram 2D Echocardiogram has been performed.  Randall Edwards 07/18/2019, 9:40 AM

## 2019-07-18 NOTE — Op Note (Signed)
CARDIOTHORACIC SURGERY OPERATIVE NOTE  Date of Procedure:  07/17/2019  Preoperative Diagnosis: Severe Aortic Stenosis   Postoperative Diagnosis: Same   Procedure:    Transcatheter Aortic Valve Replacement ( Valve in Valve) - Transapical Approach  Edwards Sapien 3  (size 26 mm, model # 9600TFX, serial # [id number])   Co-Surgeons:  Gaye Pollack, MD and Sherren Mocha, MD   Anesthesiologist:  R. Ola Spurr, MD  Echocardiographer:  Liane Comber, MD  Pre-operative Echo Findings:  Severe bioprosthetic aortic stenosis  Normal left ventricular systolic function   Post-operative Echo Findings:  No paravalvular leak  Normal left ventricular systolic function    BRIEF CLINICAL NOTE AND INDICATIONS FOR SURGERY  This 73 year old gentleman has stage D, severe, symptomatic prosthetic aortic stenosis with New York Heart Association class II symptoms of fatigue to moderate physical activity consistent with chronic diastolic congestive heart failure.  I have personally reviewed his 2D echocardiogram, cardiac catheterization, and CTA studies. He has a 25 mm pericardial aortic valve prosthesis that is thickened with reduced leaflet mobility. The mean gradient has increased from 22 mmHg 1 year ago to 36.5 mmHg now. Left ventricular systolic function is normal. Cardiac catheterization shows no coronary disease. I agree that it is probably time to proceed with aortic valve replacement given the degree of progression of the past year. I think he will probably become progressively more symptomatic over the next 6 months if this is not treated. I think the operative risk for open surgical replacement morbidity significantly higher than that predicted by the STS risk calculator since he had a composite bioprosthesis/graft Bentall procedure. This would require redo root replacement. I think valve in valve TAVR would be the best option for treating this patient. Is 25 mm Edwards Magna-Ease pericardial valve can  be replaced with 26 mm Sapien 3 valve. His gated cardiac CTA shows anatomy suitable for valve in valve TAVR. His abdominal and pelvic CTA shows adequate pelvic vascular anatomy to allow transfemoral insertion. He had an attempt at transfemoral insertion about 2 weeks ago but we were unable to pass a wire retrograde across the prosthetic valve.  Therefore the procedure was stopped and we decided to proceed with transapical insertion today.  The patient and his wife were counseled at length regarding treatment alternatives for management of severe symptomatic aortic stenosis. The risks and benefits of surgical intervention has been discussed in detail. Long-term prognosis with medical therapy was discussed. Alternative approaches such as conventional surgical aortic valve replacement, transcatheter aortic valve replacement, and palliative medical therapy were compared and contrasted at length. This discussion was placed in the context of the patient's own specific clinical presentation and past medical history. All of their questions have been addressed.   Following the decision to proceed with transcatheter aortic valve replacement, a discussion was held regarding what types of management strategies would be attempted intraoperatively in the event of life-threatening complications, including whether or not the patient would be considered a candidate for the use of cardiopulmonary bypass and/or conversion to open sternotomy for attempted surgical intervention. I do not think he would be a candidate for emergent redo sternotomy to manage any intraoperative complications. The patient has been advised of a variety of complications that might develop including but not limited to risks of death, stroke, paravalvular leak, aortic dissection or other major vascular complications, aortic annulus rupture, device embolization, cardiac rupture or perforation, mitral regurgitation, acute myocardial infarction, arrhythmia,  heart block or bradycardia requiring permanent pacemaker placement, congestive heart failure, respiratory  failure, renal failure, pneumonia, infection, other late complications related to structural valve deterioration or migration, or other complications that might ultimately cause a temporary or permanent loss of functional independence or other long term morbidity. The patient provides full informed consent for the procedure as described and all questions were answered.      DETAILS OF THE OPERATIVE PROCEDURE  PREPARATION:    The patient is brought to the operating room on the above mentioned date and central monitoring was established by the anesthesia team including placement of Swan-Ganz catheter and radial arterial line. The patient is placed in the supine position on the operating table.  Intravenous antibiotics are administered. General endotracheal anesthesia is induced uneventfully. A Foley catheter is placed.  Baseline transesophageal echocardiogram was performed.  The patient's chest, abdomen, both groins, and both lower extremities are prepared and draped in a sterile manner. A time out procedure is performed.   PERIPHERAL ACCESS:    Femoral  venous access is obtained with placement of a 6 Fr venous sheaths on the right side.  A temporary transvenous pacemaker catheter is passed through the right femoral venous sheath under fluoroscopic guidance into the right ventricle.  The pacemaker is tested to ensure stable lead placement and pacemaker capture.   TRANSAPICAL ACCESS:   The location of the left ventricular apex is confirmed using fluoroscopy, and a miniature left thoracotomy incision is made directly over the left ventricular apex.  The left pleural space is entered.  No adhesions are encountered.  A soft tissue retractor is placed and the ribs gently spread to expose the pericardial surface.  A longitudinal incision is made in the pericardium and the left ventricular apex  inspected.  The pericardium was adhesed to the heart and left intact.  An appropriate site for left ventricular sheath placement is chosen well lateral to the left anterior descending coronary artery and verified using TEE.  Two pursestring sutures are placed using pledgeted 2-0 Prolene suture.   The patient is heparinized systemically and ACT verified > 250 seconds.  The left ventricular apex is punctured using an 18 gauge needle and a soft J-tipped guidewire is passed into the left ventricle and through the aortic valve under fluoroscopic guidance while also continuously monitoring TEE for signs of entrapment in the mitral apparatus.  A 6 Fr sheath is placed over the guidewire and across the aortic valve.  A crossover catheter is passed through the sheath and maneuvered around the aortic arch into the descending thoracic aorta under fluoroscopic guidance.  An Amplatz extra stiff guidewire is passed through the catheter into the descending thoracic aorta and both the catheter and the introducing sheath are removed.  An [id number] Edwards Certitude introducer sheath is passed over the guidewire into the left ventricle.  The sheath position is continuously monitored and secured by me.  Baseline simultaneous left ventricular and aortic pressures are recorded.   BALLOON AORTIC VALVULOPLASTY:  Not performed   TRANSCATHETER HEART VALVE DEPLOYMENT:  An Edwards Sapien 3 transcatheter heart valve (size 26 mm, model # 9600TFX, serial [id number]) is prepared and crimped per manufacturer's guidelines, and the proper orientation of the valve is confirmed on the [place] Certitude delivery system.  The delivery system loader is advanced into the introducing sheath.  The valve and delivery system are advanced through the loader into the sheath and all air is evacuated.  The valve and balloon are advanced into the left ventricle and part way through the aortic valve.   The valve is  then finely positioned in the aortic  valve.  Once final position of the valve has been confirmed, the valve is deployed while temporarily holding ventilation and during rapid ventricular pacing to maintain systolic blood pressure < 50 mmHg and pulse pressure < 10 mmHg.  The balloon inflation is held for >3 seconds after reaching full deployment volume.  Once the balloon has fully deflated the balloon is retracted into the left ventricle and valve function is assessed using TEE.   There is felt to be no paravalvular leak and no central aortic insufficiency.  Left ventricular function is  unchanged from preoperatively.  There is no mitral regurgitation.  The patient's hemodynamic recovery following valve deployment is good. Mean gradient across the new prosthesis was 5 mm Hg and therefore no further dilation or valve ring fracture was indicated.   The deployment balloon and guidewire are both removed.  PROCEDURE COMPLETION:  The left ventricular sheath is removed during rapid ventricular pacing to maintain systolic blood pressure < 70 mmHg while the pursestring sutures are tied.  The apical closure is inspected and notably hemostatic.  Protamine is administered.    Once hemostasis has been ascertained, the left pleural space is drained using a single 28 Fr Bard chest tube and the mini thoracotomy incision is closed in layers and the skin incision closed using a subcuticular skin closure.   The temporary pacemaker and femoral sheath are removed.  The patient tolerated the procedure well and is transported to the surgical intensive care in stable condition. There are no intraoperative complications. All sponge instrument and needle counts are verified correct at completion of the operation.  No blood products were administered during the operation.  The patient received a total of 0 mL of intravenous contrast during the procedure.    Gaye Pollack MD

## 2019-07-19 ENCOUNTER — Encounter (HOSPITAL_COMMUNITY): Payer: Self-pay | Admitting: Physician Assistant

## 2019-07-19 ENCOUNTER — Inpatient Hospital Stay (HOSPITAL_COMMUNITY): Payer: PPO

## 2019-07-19 DIAGNOSIS — Z9889 Other specified postprocedural states: Secondary | ICD-10-CM

## 2019-07-19 LAB — TYPE AND SCREEN
ABO/RH(D): A POS
Antibody Screen: NEGATIVE
Unit division: 0
Unit division: 0

## 2019-07-19 LAB — BASIC METABOLIC PANEL
Anion gap: 8 (ref 5–15)
BUN: 18 mg/dL (ref 8–23)
CO2: 26 mmol/L (ref 22–32)
Calcium: 9.1 mg/dL (ref 8.9–10.3)
Chloride: 103 mmol/L (ref 98–111)
Creatinine, Ser: 1.27 mg/dL — ABNORMAL HIGH (ref 0.61–1.24)
GFR calc Af Amer: >60 mL/min (ref >60)
GFR calc non Af Amer: 56 mL/min — ABNORMAL LOW (ref >60)
Glucose, Bld: 114 mg/dL — ABNORMAL HIGH (ref 70–99)
Potassium: 3.7 mmol/L (ref 3.5–5.1)
Sodium: 137 mmol/L (ref 135–145)

## 2019-07-19 LAB — CBC
HCT: 35.8 % — ABNORMAL LOW (ref 39.0–52.0)
Hemoglobin: 12.2 g/dL — ABNORMAL LOW (ref 13.0–17.0)
MCH: 32.2 pg (ref 26.0–34.0)
MCHC: 34.1 g/dL (ref 30.0–36.0)
MCV: 94.5 fL (ref 80.0–100.0)
Platelets: 116 10*3/uL — ABNORMAL LOW (ref 150–400)
RBC: 3.79 MIL/uL — ABNORMAL LOW (ref 4.22–5.81)
RDW: 12.4 % (ref 11.5–15.5)
WBC: 14 10*3/uL — ABNORMAL HIGH (ref 4.0–10.5)
nRBC: 0 % (ref 0.0–0.2)

## 2019-07-19 LAB — BPAM RBC
Blood Product Expiration Date: 202011292359
Blood Product Expiration Date: 202011292359
ISSUE DATE / TIME: 202011030811
ISSUE DATE / TIME: 202011030811
Unit Type and Rh: 6200
Unit Type and Rh: 6200

## 2019-07-19 MED ORDER — AMLODIPINE BESYLATE 10 MG PO TABS: 10.0000 mg | ORAL_TABLET | Freq: Every day | ORAL | 3 refills | Status: DC

## 2019-07-19 MED ORDER — POTASSIUM CHLORIDE CRYS ER 20 MEQ PO TBCR
20.0000 meq | EXTENDED_RELEASE_TABLET | Freq: Once | ORAL | Status: AC
  Administered 2019-07-19: 20 meq via ORAL
  Filled 2019-07-19: qty 1

## 2019-07-19 MED ORDER — SORBITOL 70 % SOLN
30.0000 mL | Freq: Once | Status: AC
  Administered 2019-07-19: 30 mL via ORAL
  Filled 2019-07-19: qty 30

## 2019-07-19 MED ORDER — ALBUTEROL SULFATE (2.5 MG/3ML) 0.083% IN NEBU: 2.5000 mg | INHALATION_SOLUTION | Freq: Two times a day (BID) | RESPIRATORY_TRACT | Status: DC

## 2019-07-19 MED ORDER — POTASSIUM CHLORIDE ER 10 MEQ PO TBCR: 10.0000 meq | EXTENDED_RELEASE_TABLET | Freq: Every day | ORAL | 6 refills | Status: DC

## 2019-07-19 MED ORDER — PANTOPRAZOLE SODIUM 40 MG PO TBEC: 40.0000 mg | DELAYED_RELEASE_TABLET | Freq: Every day | ORAL | 1 refills | Status: DC

## 2019-07-19 MED ORDER — OXYCODONE-ACETAMINOPHEN 5-325 MG PO TABS: 1.0000 | ORAL_TABLET | ORAL | 0 refills | Status: DC | PRN

## 2019-07-19 MED ORDER — CLOPIDOGREL BISULFATE 75 MG PO TABS: 75.0000 mg | ORAL_TABLET | Freq: Every day | ORAL | 1 refills | Status: DC

## 2019-07-19 NOTE — Progress Notes (Signed)
Pt has walked approx 400 ft X 7 today. He has begun to pass gas. OK to D/C home per Dr Cyndia Bent. Will call wife and plan for pt d/c.

## 2019-07-19 NOTE — Progress Notes (Signed)
CARDIAC REHAB PHASE I   PRE:  Rate/Rhythm: 91 SR  BP:  Sitting: 110/74      SaO2: 95 RA  MODE:  Ambulation: 370 ft   POST:  Rate/Rhythm: 103 ST    SaO2: 93 RA   Pt ambulated 371ft in hallway standby assist without a device. Pt denies dizziness, SOB, or pain. Encouraged continued IS use and walks with emphasis on safety. Reviewed restrictions and site care.  [id number] Rufina Falco, RN BSN 07/19/2019 10:17 AM

## 2019-07-19 NOTE — Discharge Summary (Addendum)
[place] VALVE TEAM  Discharge Summary    Patient ID: Randall Edwards MRN: [id number]; DOB: [birth year]  Admit date: 07/17/2019 Discharge date: 07/19/2019  Primary Care Provider: Nicoletta Dress, MD  Primary Cardiologist: Dr. Geraldo Pitter / Dr. Burt Knack & Dr. Cyndia Bent (TAVR)  Discharge Diagnoses    Principal Problem:   s/p valve-in-valve TAVR Active Problems:   Dyslipidemia   Essential hypertension   S/P AVR (aortic valve replacement)   Status post radiofrequency ablation for arrhythmia   Severe aortic stenosis   Ascending aorta dilatation (HCC)   S/P aorta repair   Allergies No Known Allergies  Diagnostic Studies/Procedures    CARDIOTHORACIC SURGERY OPERATIVE NOTE  Date of Procedure:                07/17/2019  Preoperative Diagnosis:      Severe Aortic Stenosis   Postoperative Diagnosis:    Same   Procedure:        Transcatheter Aortic Valve Replacement ( Valve in Valve) - Transapical Approach             Edwards Sapien 3  (size 26 mm, model # 9600TFX, serial # [id number])              Co-Surgeons:                        Gaye Pollack, MD and Sherren Mocha, MD   Anesthesiologist:                  Deatra Canter, MD  Echocardiographer:              Liane Comber, MD  Pre-operative Echo Findings: ? Severe bioprosthetic aortic stenosis ? Normal left ventricular systolic function   Post-operative Echo Findings: ? No paravalvular leak ? Normal left ventricular systolic function _____________    Echo 07/18/19 IMPRESSIONS  1. Left ventricular ejection fraction, by visual estimation, is 55 to 60%. The left ventricle has normal function. There is no left ventricular hypertrophy.  2. Elevated left ventricular end-diastolic pressure.  3. Left ventricular diastolic parameters are consistent with Grade II diastolic dysfunction (pseudonormalization).  4. Global right ventricle has normal systolic function.The right  ventricular size is normal. No increase in right ventricular wall thickness.  5. Left atrial size was mildly dilated.  6. Right atrial size was normal.  7. The mitral valve is normal in structure. Mild mitral valve regurgitation. No evidence of mitral stenosis.  8. The tricuspid valve is normal in structure. Tricuspid valve regurgitation is not demonstrated.  9. 29mm Edwards Sapien bioprosthetic, stented aortic valve (TAVR) valve is present in the aortic position. Aortic valve mean gradient measures 13.5 mmHg. Aortic valve peak gradient measures 24.9 mmHg. Aortic valve area, by VTI measures 1.46 cm.  Dimensionless index is 0.42. No perivalvular AI is present. 10. IVC normal in size with greater than 50% respiratory variability, suggesting right atrial pressure of 3 mmHg    History of Present Illness     Randall Edwards is a 73 y.o. male with a history of HTN, HLD, s/p bioprosthetic Bentall procedure in 2010 by Dr. Cyndia Bent, scar related focal atrial tachycardia (some places documented as afib) s/p ablation by Dr. Minna Merritts on Sotalol (not on [facility]) and severe bioprosthetic AS who presented to [facility] on 07/17/2019 for planned TAVR.  He had a 25 mm Edwards Magna-Easepericardial valve insidea Gelweave Valsalva graft. The valvemodel [id number] [id number]. He has done well  since that surgery now presents with a couple month history of exertional fatigue such as doing yard work. His wife said that he has to rest frequently and is tired at the end of the day. He has a history of atrial fibrillation/tachyardia and underwent ablation in 2017. He continues to take sotalol for rhythm control.He has had periodic echocardiograms to follow-up on his prosthetic valve. His most recent echocardiogram 05/14/2011 showed an increase in the mean gradient to 36.5 mmHg with a peak gradient of 60 mm. The dimensionless index of 0.26. Left ventricular ejection fraction was 60 to 65%. His mean aortic valve gradient 1 year ago was 22  mmHg.  Patient was originally brought in on 07/03/2023 TAVR via the TF approach.  However, we were unable to pass a guidewire across the valve and surgery was aborted.  He was brought back on 07/17/2019 for repeat TAVR attempt via the transapical approach.  Hospital Course     Consultants: none  Severe AS: s/p successful TAVR with a 26 mm Edwards Sapien 3 Ultra THV via the TA approach on 07/17/19. Post operative echo showed EF 55-60% with normally functioning TAVR with a mean gradient of 13.5 mmHg and no PVL. Groin site and transapical site are stable. Chest tube has been removed. ECG with NSR with old first-degree AV block and new LBBB (122 ms.) There is no evidence of HAVB.  He has been started on plavix and continued on aspirin. Plan for discharge home today if he has a BM and close follow up in the office next week. Short supply of pain meds called in to pharmacy.  AKI: creat bumped to 1.49. This has been improved. Will resume Maxide at discharge. Check BMET next week in the office.   HTN: BP has been elevated. Initially he required a nitro gtt. Amlodipine 5mg  added for better control given recent transapical TAVR.   Hypokalemia: potassium now back in normal range on potassium supplementation. I have changed this to daily dosing instead of every other day.  Atrial tach s/p ablation: maintaining NSR. Continued on sotolol.   GERD: Prilosec was changed to Protonix given potential drug drug interaction with Plavix.  _____________  Discharge Vitals Blood pressure 140/70, pulse 67, temperature 98.6 F (37 C), temperature source Oral, resp. rate (!) 31, height 5\' 9"  (1.753 m), weight 88 kg, SpO2 95 %.  Filed Weights   07/17/19 1100 07/18/19 0500 07/19/19 0500  Weight: 82.7 kg 85.8 kg 88 kg    Labs & Radiologic Studies    CBC Recent Labs    07/18/19 0345 07/19/19 0235  WBC 14.0* 14.0*  HGB 14.0 12.2*  HCT 40.2 35.8*  MCV 93.7 94.5  PLT 144* [id number]*   Basic Metabolic Panel Recent  Labs    07/18/19 0345 07/19/19 0235  NA 138 137  K 4.2 3.7  CL 105 103  CO2 22 26  GLUCOSE 133* 114*  BUN 18 18  CREATININE 1.49* 1.27*  CALCIUM 9.3 9.1  MG 1.8  --    Liver Function Tests No results for input(s): AST, ALT, ALKPHOS, BILITOT, PROT, ALBUMIN in the last 72 hours. No results for input(s): LIPASE, AMYLASE in the last 72 hours. Cardiac Enzymes No results for input(s): CKTOTAL, CKMB, CKMBINDEX, TROPONINI in the last 72 hours. BNP Invalid input(s): POCBNP D-Dimer No results for input(s): DDIMER in the last 72 hours. Hemoglobin A1C No results for input(s): HGBA1C in the last 72 hours. Fasting Lipid Panel No results for input(s): CHOL, HDL, LDLCALC, TRIG,  CHOLHDL, LDLDIRECT in the last 72 hours. Thyroid Function Tests No results for input(s): TSH, T4TOTAL, T3FREE, THYROIDAB in the last 72 hours.  Invalid input(s): FREET3 _____________  Dg Chest 1 View  Result Date: 07/17/2019 CLINICAL DATA:  Shortness of breath EXAM: CHEST  1 VIEW COMPARISON:  07/17/2019 FINDINGS: Right central line remains in place, unchanged. Prior CABG. Low lung volumes. Left perihilar and basilar opacities, minimally improved since prior study. No confluent opacity on the right. Small left pleural effusion. No acute bony abnormality. IMPRESSION: Low lung volumes. Left perihilar and lower lobe opacities could reflect atelectasis or pneumonia, slightly improved since prior study. Small left effusion. Electronically Signed   By: Rolm Baptise M.D.   On: 07/17/2019 19:31   Dg Chest 2 View  Result Date: 07/19/2019 CLINICAL DATA:  73 year old male postoperative status post TAVR for treatment of symptomatic prosthetic aortic valve. EXAM: CHEST - 2 VIEW COMPARISON:  07/18/2019 and earlier. FINDINGS: Right IJ central line removed. Stable cardiac size and mediastinal contours. Mildly improved lung volumes. Improved left lung base ventilation although with persistent curvilinear platelike atelectasis. Stable  ventilation at the right lung base. No pneumothorax or pulmonary edema. Trace if any pleural effusion. Negative visible bowel gas pattern. Stable visualized osseous structures. IMPRESSION: 1. Right IJ central line removed. No pneumothorax or pulmonary edema. 2. Improved lung volumes and ventilation at the left lung base with persistent curvilinear atelectasis. Electronically Signed   By: Genevie Ann M.D.   On: 07/19/2019 08:56   Dg Chest 2 View  Result Date: 07/18/2019 CLINICAL DATA:  Chest tube EXAM: CHEST - 2 VIEW COMPARISON:  July 17, 2019 FINDINGS: The right-sided central venous catheter is stable in positioning. There is a left-sided chest tube in place. There is no significant pneumothorax. There is a persistent left-sided pleural effusion with adjacent atelectasis. The lung volumes remain low. The heart size is stable from prior study. Aortic calcifications are noted. IMPRESSION: 1. Lines and tubes as above. 2. Stable appearance of the chest.  No pneumothorax. Electronically Signed   By: Constance Holster M.D.   On: 07/18/2019 05:38   Dg Chest 2 View  Result Date: 07/13/2019 CLINICAL DATA:  Preoperative study.  AVR. EXAM: CHEST - 2 VIEW COMPARISON:  June 29, 2019 FINDINGS: Sternotomy wires are intact. A tortuous thoracic aorta is stable. The heart, hila, and mediastinum are unchanged. No pneumothorax. No nodules or masses. No focal infiltrates. IMPRESSION: No active cardiopulmonary disease. Electronically Signed   By: Dorise Bullion III M.D   On: 07/13/2019 14:15   Dg Chest 2 View  Result Date: 06/29/2019 CLINICAL DATA:  73 year old male under preoperative evaluation prior to aortic valve replacement. EXAM: CHEST - 2 VIEW COMPARISON:  Chest x-ray 05/05/2017. FINDINGS: Lung volumes are normal. No consolidative airspace disease. No pleural effusions. No pneumothorax. No pulmonary nodule or mass noted. Pulmonary vasculature and the cardiomediastinal silhouette are within normal limits.  Atherosclerosis in the thoracic aorta. Status post median sternotomy for mitral valve replacement. IMPRESSION: 1. No radiographic evidence of acute cardiopulmonary disease. 2. Aortic atherosclerosis. Electronically Signed   By: Vinnie Langton M.D.   On: 06/29/2019 15:46   Dg Chest Port 1 View  Result Date: 07/17/2019 CLINICAL DATA:  Status post TAVR EXAM: PORTABLE CHEST 1 VIEW COMPARISON:  07/13/2019 FINDINGS: Cardiac shadow is mildly prominent but accentuated by the portable technique. Interval TAVR is noted in satisfactory position. Right jugular central line is seen in the distal superior vena cava. Left chest tube is noted without  evidence of pneumothorax. The overall inspiratory effort is poor with crowding of the vascular markings. No focal infiltrate is seen. IMPRESSION: Postoperative change. Left chest tube without evidence of pneumothorax. Poor inspiratory effort with crowding of the vascular markings. Electronically Signed   By: Inez Catalina M.D.   On: 07/17/2019 11:09   Disposition   Pt is being discharged home today in good condition.  Follow-up Plans & Appointments    Follow-up Information    Eileen Stanford, PA-C. Go on 07/25/2019.   Specialties: Cardiology, Radiology Why: @ 1:30pm, please arrive at least 10 minutes early Contact information: [address] [place] [address] [phone]            Discharge Medications   Allergies as of 07/19/2019   No Known Allergies     Medication List    STOP taking these medications   omeprazole 20 MG capsule Commonly known as: PRILOSEC Replaced by: pantoprazole 40 MG tablet     TAKE these medications   amLODipine 10 MG tablet Commonly known as: NORVASC Take 1 tablet (10 mg total) by mouth daily. Start taking on: July 20, 2019   aspirin EC 81 MG tablet Take 81 mg by mouth daily.   clopidogrel 75 MG tablet Commonly known as: PLAVIX Take 1 tablet (75 mg total) by mouth daily with  breakfast. Start taking on: July 20, 2019   diazepam 5 MG tablet Commonly known as: Valium Take 5mg  (one tablet) 1 hour prior to CT scan. You may take the second 5mg  tablet (for a total of 10mg ) if necessary.   fexofenadine 180 MG tablet Commonly known as: ALLEGRA Take 180 mg by mouth 2 (two) times daily.   oxyCODONE-acetaminophen 5-325 MG tablet Commonly known as: Percocet Take 1 tablet by mouth every 4 (four) hours as needed for severe pain.   pantoprazole 40 MG tablet Commonly known as: PROTONIX Take 1 tablet (40 mg total) by mouth daily. Start taking on: July 20, 2019 Replaces: omeprazole 20 MG capsule   potassium chloride 10 MEQ tablet Commonly known as: KLOR-CON Take 1 tablet (10 mEq total) by mouth daily. What changed: when to take this   rosuvastatin 10 MG tablet Commonly known as: CRESTOR TAKE ONE TABLET BY MOUTH EVERY DAY IN THE EVENING   sodium chloride 0.65 % Soln nasal spray Commonly known as: OCEAN Place 1 spray into both nostrils 3 (three) times daily as needed for congestion.   sotalol 80 MG tablet Commonly known as: BETAPACE Take 40 mg by mouth 2 (two) times daily.   tamsulosin 0.4 MG Caps capsule Commonly known as: FLOMAX Take 0.4 mg by mouth daily after supper.   temazepam 15 MG capsule Commonly known as: RESTORIL Take 15 mg by mouth at bedtime.   triamterene-hydrochlorothiazide 37.5-25 MG tablet Commonly known as: MAXZIDE-25 TAKE ONE TABLET BY MOUTH EVERY DAY           Outstanding Labs/Studies   BMET  Duration of Discharge Encounter   Greater than 30 minutes including physician time.  SignedAngelena Form, PA-C 07/19/2019, 3:55 PM [phone]

## 2019-07-19 NOTE — Progress Notes (Signed)
2 Days Post-Op Procedure(s) (LRB): TRANSCATHETER AORTIC VALVE REPLACEMENT, TRANSAPICAL (N/A) TRANSESOPHAGEAL ECHOCARDIOGRAM (TEE) (N/A) Subjective: Feels ok, ambulating. No flatus or BM yet.  Objective: Vital signs in last 24 hours: Temp:  [97.7 F (36.5 C)-98.2 F (36.8 C)] 98.1 F (36.7 C) (11/05 0336) Pulse Rate:  [50-97] 97 (11/05 0500) Cardiac Rhythm: Normal sinus rhythm (11/05 0000) Resp:  [14-31] 31 (11/04 1900) BP: (101-149)/(70-94) 144/78 (11/05 0600) SpO2:  [89 %-98 %] 95 % (11/05 0500) Weight:  [88 kg] 88 kg (11/05 0500)  Hemodynamic parameters for last 24 hours:    Intake/Output from previous day: 11/04 0701 - 11/05 0700 In: 395.2 [P.O.:240; I.V.:55.2; IV Piggyback:100] Out: 1875 [Urine:1875] Intake/Output this shift: No intake/output data recorded.  General appearance: alert and cooperative Neurologic: intact Heart: regular rate and rhythm, S1, S2 normal, no murmur Lungs: clear to auscultation bilaterally Abdomen: soft, non-tender; bowel sounds normal Wound: Aquacel dressing dry  Lab Results: Recent Labs    07/18/19 0345 07/19/19 0235  WBC 14.0* 14.0*  HGB 14.0 12.2*  HCT 40.2 35.8*  PLT 144* 116*   BMET:  Recent Labs    07/18/19 0345 07/19/19 0235  NA 138 137  K 4.2 3.7  CL 105 103  CO2 22 26  GLUCOSE 133* 114*  BUN 18 18  CREATININE 1.49* 1.27*  CALCIUM 9.3 9.1    PT/INR: No results for input(s): LABPROT, INR in the last 72 hours. ABG    Component Value Date/Time   PHART 7.470 (H) 07/13/2019 1355   HCO3 23.6 07/13/2019 1355   TCO2 25 07/17/2019 1105   O2SAT 97.3 07/13/2019 1355   CBG (last 3)  No results for input(s): GLUCAP in the last 72 hours.   ECHOCARDIOGRAM REPORT       Patient Name:   Randall Edwards Date of Exam: 07/18/2019 Medical Rec #:  [id number]      Height:       69.0 in Accession #:    [id number]     Weight:       189.2 lb Date of Birth:  1946/06/01      BSA:          2.02 m Patient Age:    73 years        BP:           130/90 mmHg Patient Gender: M              HR:           59 bpm. Exam Location:  Inpatient  Procedure: 2D Echo, Color Doppler and Cardiac Doppler  Indications:    Post-TAVR Evaluation   History:        Patient has prior history of Echocardiogram examinations, most                 recent 07/17/2019. Aortic Valve replaced in 2010 with 85mm Magna                 bioprosthetic.                 07/17/19 Valve-in-valve replacement with 30mm Edwards Sapien 3.   Sonographer:    Raquel Sarna Senior RDCS Referring Phys: [id number] [place]    1. Left ventricular ejection fraction, by visual estimation, is 55 to 60%. The left ventricle has normal function. There is no left ventricular hypertrophy.  2. Elevated left ventricular end-diastolic pressure.  3. Left ventricular diastolic parameters are consistent with Grade II diastolic dysfunction (pseudonormalization).  4. Global right ventricle has normal systolic function.The right ventricular size is normal. No increase in right ventricular wall thickness.  5. Left atrial size was mildly dilated.  6. Right atrial size was normal.  7. The mitral valve is normal in structure. Mild mitral valve regurgitation. No evidence of mitral stenosis.  8. The tricuspid valve is normal in structure. Tricuspid valve regurgitation is not demonstrated.  9. 70mm Edwards Sapien bioprosthetic, stented aortic valve (TAVR) valve is present in the aortic position. Aortic valve mean gradient measures 13.5 mmHg. Aortic valve peak gradient measures 24.9 mmHg. Aortic valve area, by VTI measures 1.46 cm.  Dimensionless index is 0.42. No perivalvular AI is present. 10. IVC normal in size with greater than 50% respiratory variability, suggesting right atrial pressure of 3 mmHg.  FINDINGS  Left Ventricle: Left ventricular ejection fraction, by visual estimation, is 55 to 60%. The left ventricle has normal function. There is no left ventricular  hypertrophy. Left ventricular diastolic parameters are consistent with Grade II diastolic  dysfunction (pseudonormalization). Elevated left ventricular end-diastolic pressure.  Right Ventricle: The right ventricular size is normal. No increase in right ventricular wall thickness. Global RV systolic function is has normal systolic function.  Left Atrium: Left atrial size was mildly dilated.  Right Atrium: Right atrial size was normal in size  Pericardium: There is no evidence of pericardial effusion.  Mitral Valve: The mitral valve is normal in structure. No evidence of mitral valve stenosis by observation. Mild mitral valve regurgitation.  Tricuspid Valve: The tricuspid valve is normal in structure. Tricuspid valve regurgitation is not demonstrated.  Aortic Valve: Aortic valve mean gradient measures 13.5 mmHg. Aortic valve peak gradient measures 24.9 mmHg. Aortic valve area, by VTI measures 1.46 cm. 37mm Edwards Sapien bioprosthetic, stented aortic valve (TAVR) valve is present in the aortic  position.  Pulmonic Valve: The pulmonic valve was normal in structure. Pulmonic valve regurgitation is not visualized.  Aorta: The aortic root, ascending aorta and aortic arch are all structurally normal, with no evidence of dilitation or obstruction.  Venous: The inferior vena cava is normal in size with greater than 50% respiratory variability, suggesting right atrial pressure of 3 mmHg.  IAS/Shunts: No atrial level shunt detected by color flow Doppler. No ventricular septal defect is seen or detected. There is no evidence of an atrial septal defect.     LEFT VENTRICLE PLAX 2D LVIDd:         3.90 cm  Diastology LVIDs:         2.30 cm  LV e' lateral:   3.89 cm/s LV PW:         1.10 cm  LV E/e' lateral: 18.1 LV IVS:        1.30 cm  LV e' medial:    3.24 cm/s LVOT diam:     2.10 cm  LV E/e' medial:  21.7 LV SV:         48 ml LV SV Index:   23.18 LVOT Area:     3.46 cm     RIGHT VENTRICLE RV S prime:     6.49 cm/s TAPSE (M-mode): 1.8 cm  LEFT ATRIUM             Index       RIGHT ATRIUM           Index LA diam:        4.40 cm 2.18 cm/m  RA Area:     11.40 cm LA Vol (A2C):  37.6 ml 18.64 ml/m RA Volume:   18.90 ml  9.37 ml/m LA Vol (A4C):   48.8 ml 24.19 ml/m LA Biplane Vol: 42.9 ml 21.26 ml/m  AORTIC VALVE AV Area (Vmax):    1.39 cm AV Area (Vmean):   1.50 cm AV Area (VTI):     1.46 cm AV Vmax:           249.50 cm/s AV Vmean:          169.500 cm/s AV VTI:            0.499 m AV Peak Grad:      24.9 mmHg AV Mean Grad:      13.5 mmHg LVOT Vmax:         100.00 cm/s LVOT Vmean:        73.400 cm/s LVOT VTI:          0.211 m LVOT/AV VTI ratio: 0.42   AORTA Ao Root diam: 3.20 cm Ao Asc diam:  4.10 cm  MITRAL VALVE MV Area (PHT): 2.01 cm             SHUNTS MV PHT:        109.33 msec          Systemic VTI:  0.21 m MV Decel Time: 377 msec             Systemic Diam: 2.10 cm MV E velocity: 70.40 cm/s 103 cm/s MV A velocity: 92.00 cm/s 70.3 cm/s MV E/A ratio:  0.77       1.5    Fransico Him MD Electronically signed by Fransico Him MD Signature Date/Time: 07/18/2019/10:13:48 AM     Assessment/Plan: S/P Procedure(s) (LRB): TRANSCATHETER AORTIC VALVE REPLACEMENT, TRANSAPICAL (N/A) TRANSESOPHAGEAL ECHOCARDIOGRAM (TEE) (N/A)  POD 2 He has been hemodynamically stable in sinus rhythm.  Echo reviewed and looks fine with mean gradient 13.5 mm Hg, no paravalvular leak and no pericardial effusion.  Creat coming back down and diuresing spontaneously.  Will give him some Sorbitol this am and if he has BM plan to let him go home today, otherwise tomorrow.  Continue ambulation.  Resume Maxide at discharge. Continue Norvasc at discharge and follow up BP as outpt.   LOS: 2 days    Gaye Pollack 07/19/2019

## 2019-07-20 ENCOUNTER — Telehealth: Payer: Self-pay

## 2019-07-20 MED FILL — Magnesium Sulfate Inj 50%: INTRAMUSCULAR | Qty: 10 | Status: AC

## 2019-07-20 MED FILL — Potassium Chloride Inj 2 mEq/ML: INTRAVENOUS | Qty: 40 | Status: AC

## 2019-07-20 MED FILL — Heparin Sodium (Porcine) Inj 1000 Unit/ML: INTRAMUSCULAR | Qty: 30 | Status: AC

## 2019-07-20 NOTE — Telephone Encounter (Signed)
Patient contacted regarding discharge from [facility] on 07/19/2019.  Patient understands to follow up with provider Nell Range PA-C on 07/25/2019 at 1:30 PM at [facility] location. Patient understands discharge instructions? yes Patient understands medications and regiment? yes Patient understands to bring all medications to this visit? yes   Postop Surgical Patients:                What is your wound status? Any signs/ symptoms of infection (Temp, redness/ red streaks, swelling, purulent drainage, foul odor or smell)? No issues at this time.  The pt and wife are aware to monitor for these symptoms.  .             Please do not place any creams/ lotions/ or antibiotic ointment on any surgical incisions/ wounds without physician approval. .             Please note that it is ok to remove your surgical dressing, shower (soap/ water), and pat the incision dry.  The pt is doing well today.  He has taken a shower already and placed a new dressing on incision.

## 2019-07-24 NOTE — Progress Notes (Signed)
[facility] [place]                                       Cardiology Office Note    Date:  07/25/2019   ID:  [name], DOB 01-28-1946, MRN [id number]  PCP:  Nicoletta Dress, MD  Cardiologist: Dr. Geraldo Pitter / Dr. Burt Knack & Dr. Cyndia Bent (TAVR)  CC: [facility] s/p TAVR  History of Present Illness:  Randall Edwards is a 73 y.o. male with a history of HTN, HLD, s/p bioprosthetic Bentall procedurein 2010 byDr. Cyndia Bent, scar related focal atrial tachycardia (some places documented as afib) s/p ablation by Dr. Minna Merritts on Sotalol (not on OAC)and severe bioprosthetic AS s/p valve in valve TAVR (07/17/19) who present to clinic for follow up.   He had a 25 mm Edwards Magna-Easepericardial valve insidea Gelweave Valsalva graft. The valvemodel [id number] TFX. He has done well since that surgery now presents with a couple month history of exertional fatigue such as doing yard work. His wife said that he has to rest frequently and is tired at the end of the day. He has a history of atrial fibrillation/tachyardiaand underwent ablation in 2017. He continues to take sotalol for rhythm control.He has had periodic echocardiograms to follow-up on his prosthetic valve. His most recent echocardiogram 05/14/2011 showed an increase in the mean gradient to 36.5 mmHg with a peak gradient of 60 mm. The dimensionless index of 0.26. Left ventricular ejection fraction was 60 to 65%. His mean aortic valve gradient 1 year ago was 22 mmHg.  Patient was originally brought in on 07/03/2023 TAVR via the TF approach. However, we were unable to pass a guidewire across the valve and surgery was aborted. He underwent successful TAVR with a 26 mm Edwards Sapien 3 Ultra THV via the TA approach on 07/17/19. Post operative echo showed EF 55-60% with normally functioning TAVR with a mean gradient of 13.5 mmHg and no PVL. ECG with NSR with old first-degree AV block and new LBBB (122 ms.) He had  AKI with creat up to 1.49. His BP was also elevated and he was started on Norvasc. He was continued on aspirin and started on plavix.   Today he presents to clinic for follow up. Doing well. He initially had a lot of pain at incision site but now that is improving. No CP or SOB. No LE edema, orthopnea or PND. No dizziness or syncope. No blood in stool or urine. No palpitations. Has some nerve pain in his wrist where he had venipuncture.     Past Medical History:  Diagnosis Date  . Atrial tachycardia ([place])    s/p ablation and on sotolol  . BPH (benign prostatic hyperplasia)   . GERD (gastroesophageal reflux disease)   . H/O cardiac radiofrequency ablation 08/2016  . Hyperlipidemia   . Hypertension   . S/P aorta repair   . S/P AVR (aortic valve replacement) 2010  . s/p valve-in-valve TAVR 07/17/2019   s/p VIV TAVR via the TA approach after failed TF case 07/03/19  . Vertigo     Past Surgical History:  Procedure Laterality Date  . ABLATION OF DYSRHYTHMIC FOCUS    . CARDIAC VALVE REPLACEMENT    . CHOLECYSTECTOMY    . KIDNEY STONE SURGERY    . KNEE ARTHROSCOPY Bilateral   . MASS EXCISION Right 12/26/2017   Procedure: RIGHT INDEX  EXCISION CYST AND DEBRIDEMENT OF DISTAL INTERPHALANGEAL JOINT;  Surgeon: Leanora Cover, MD;  Location: [place];  Service: Orthopedics;  Laterality: Right;  Bier block  . RIGHT HEART CATH AND CORONARY ANGIOGRAPHY N/A 06/20/2019   Procedure: RIGHT HEART CATH AND CORONARY ANGIOGRAPHY;  Surgeon: Sherren Mocha, MD;  Location: [facility];  Service: Cardiovascular;  Laterality: N/A;  . TEE WITHOUT CARDIOVERSION N/A 07/03/2019   Procedure: TRANSESOPHAGEAL ECHOCARDIOGRAM (TEE);  Surgeon: Sherren Mocha, MD;  Location: [place];  Service: Open Heart Surgery;  Laterality: N/A;  . TEE WITHOUT CARDIOVERSION N/A 07/17/2019   Procedure: TRANSESOPHAGEAL ECHOCARDIOGRAM (TEE);  Surgeon: Sherren Mocha, MD;  Location: [place];  Service: Open Heart Surgery;   Laterality: N/A;  . TRANSCATHETER AORTIC VALVE REPLACEMENT, TRANSAPICAL N/A 07/17/2019   Procedure: TRANSCATHETER AORTIC VALVE REPLACEMENT, TRANSAPICAL;  Surgeon: Sherren Mocha, MD;  Location: [place];  Service: Open Heart Surgery;  Laterality: N/A;  . TRANSCATHETER AORTIC VALVE REPLACEMENT, TRANSFEMORAL N/A 07/03/2019   Procedure: attempted TRANSCATHETER AORTIC VALVE REPLACEMENT, TRANSFEMORAL;  Surgeon: Sherren Mocha, MD;  Location: [place];  Service: Open Heart Surgery;  Laterality: N/A;    Current Medications: Outpatient Medications Prior to Visit  Medication Sig Dispense Refill  . amLODipine (NORVASC) 10 MG tablet Take 1 tablet (10 mg total) by mouth daily. 90 tablet 3  . aspirin EC 81 MG tablet Take 81 mg by mouth daily.     . clopidogrel (PLAVIX) 75 MG tablet Take 1 tablet (75 mg total) by mouth daily with breakfast. 90 tablet 1  . fexofenadine (ALLEGRA) 180 MG tablet Take 180 mg by mouth 2 (two) times daily.     Marland Kitchen oxyCODONE-acetaminophen (PERCOCET) 5-325 MG tablet Take 1 tablet by mouth every 4 (four) hours as needed for severe pain. 20 tablet 0  . pantoprazole (PROTONIX) 40 MG tablet Take 1 tablet (40 mg total) by mouth daily. 90 tablet 1  . potassium chloride (KLOR-CON) 10 MEQ tablet Take 1 tablet (10 mEq total) by mouth daily. 30 tablet 6  . rosuvastatin (CRESTOR) 10 MG tablet TAKE ONE TABLET BY MOUTH EVERY DAY IN THE EVENING 90 tablet 3  . sodium chloride (OCEAN) 0.65 % SOLN nasal spray Place 1 spray into both nostrils 3 (three) times daily as needed for congestion.    . sotalol (BETAPACE) 80 MG tablet Take 40 mg by mouth 2 (two) times daily.    . tamsulosin (FLOMAX) 0.4 MG CAPS capsule Take 0.4 mg by mouth daily after supper.     . temazepam (RESTORIL) 15 MG capsule Take 15 mg by mouth at bedtime.     . triamterene-hydrochlorothiazide (MAXZIDE-25) 37.5-25 MG tablet TAKE ONE TABLET BY MOUTH EVERY DAY 90 tablet 1  . diazepam (VALIUM) 5 MG tablet Take 5mg  (one tablet) 1 hour prior to  CT scan. You may take the second 5mg  tablet (for a total of 10mg ) if necessary. (Patient not taking: Reported on 07/25/2019) 2 tablet 0   No facility-administered medications prior to visit.      Allergies:   Patient has no known allergies.   Social History   Socioeconomic History  . Marital status: Married    Spouse name: Not on file  . Number of children: Not on file  . Years of education: Not on file  . Highest education level: Not on file  Occupational History  . Not on file  Social Needs  . Financial resource strain: Not on file  . Food insecurity    Worry: Not on  file    Inability: Not on file  . Transportation needs    Medical: Not on file    Non-medical: Not on file  Tobacco Use  . Smoking status: Never Smoker  . Smokeless tobacco: Never Used  Substance and Sexual Activity  . Alcohol use: No    Frequency: Never  . Drug use: No  . Sexual activity: Not on file  Lifestyle  . Physical activity    Days per week: Not on file    Minutes per session: Not on file  . Stress: Not on file  Relationships  . Social Herbalist on phone: Not on file    Gets together: Not on file    Attends religious service: Not on file    Active member of club or organization: Not on file    Attends meetings of clubs or organizations: Not on file    Relationship status: Not on file  Other Topics Concern  . Not on file  Social History Narrative  . Not on file     Family History:  The patient's family history includes CAD in his brother.     ROS:   Please see the history of present illness.    ROS All other systems reviewed and are negative.   PHYSICAL EXAM:   VS:  BP 110/78   Pulse 91   Ht 5\' 9"  (1.753 m)   Wt 185 lb 1.9 oz (84 kg)   SpO2 94%   BMI 27.34 kg/m    GEN: Well nourished, well developed, in no acute distress HEENT: normal Neck: no JVD or masses Cardiac: RRR; soft flow murmur. No rubs, or gallops,no edema  Respiratory:  clear to auscultation  bilaterally, normal work of breathing GI: soft, nontender, nondistended, + BS MS: no deformity or atrophy Skin: warm and dry, no rash.  Groin site clear without hematoma or ecchymosis. Chest tube suture removed. Inframammary suture healing well. Ecchymosis down left trunk and bank Neuro:  Alert and Oriented x 3, Strength and sensation are intact Psych: euthymic mood, full affect   Wt Readings from Last 3 Encounters:  07/25/19 185 lb 1.9 oz (84 kg)  07/19/19 194 lb 0.1 oz (88 kg)  07/13/19 185 lb 1.6 oz (84 kg)      Studies/Labs Reviewed:   EKG:  EKG is ordered today.  The ekg ordered today demonstrates NSR, ILBBB (QRS 106 ms), LVH with repol abnormalities HR 91  Recent Labs: 07/13/2019: ALT 21; B Natriuretic Peptide 72.0 07/18/2019: Magnesium 1.8 07/19/2019: BUN 18; Creatinine, Ser 1.27; Hemoglobin 12.2; Platelets 116; Potassium 3.7; Sodium 137   Lipid Panel No results found for: CHOL, TRIG, HDL, CHOLHDL, VLDL, LDLCALC, LDLDIRECT  Additional studies/ records that were reviewed today include:  CARDIOTHORACIC SURGERY OPERATIVE NOTE  Date of Procedure:07/17/2019  Preoperative Diagnosis:Severe Aortic Stenosis   Postoperative Diagnosis:Same   Procedure:   Transcatheter Aortic Valve Replacement( Valve in Valve)- Transapical Approach Edwards Sapien 3(size 45mm, model # [id number], serial [id number])  Co-Surgeons:Bryan Alveria Apley, MD and Sherren Mocha, MD   Anesthesiologist:R. Ola Spurr, MD  Anne Hahn, MD  Pre-operative Echo Findings: ? Severebioprostheticaortic stenosis ? Normalleft ventricular systolic function   Post-operative Echo Findings: ? Noparavalvular leak ? Normalleft ventricular systolic function _____________    Echo 07/18/19 IMPRESSIONS 1. Left ventricular ejection fraction, by visual estimation, is 55 to  60%. The left ventricle has normal function. There is no left ventricular hypertrophy. 2. Elevated left ventricular end-diastolic pressure. 3. Left ventricular diastolic parameters are  consistent with Grade II diastolic dysfunction (pseudonormalization). 4. Global right ventricle has normal systolic function.The right ventricular size is normal. No increase in right ventricular wall thickness. 5. Left atrial size was mildly dilated. 6. Right atrial size was normal. 7. The mitral valve is normal in structure. Mild mitral valve regurgitation. No evidence of mitral stenosis. 8. The tricuspid valve is normal in structure. Tricuspid valve regurgitation is not demonstrated. 9. 26mm Edwards Sapien bioprosthetic, stented aortic valve (TAVR) valve is present in the aortic position. Aortic valve mean gradient measures 13.5 mmHg. Aortic valve peak gradient measures 24.9 mmHg. Aortic valve area, by VTI measures 1.46 cm.  Dimensionless index is 0.42. No perivalvular AI is present. 10. IVC normal in size with greater than 50% respiratory variability, suggesting right atrial pressure of 3 mmHg   ASSESSMENT & PLAN:   Severe AS s/p TAVR: groin site and inframammary site healing well. Chest tube suture removed. ECG with sinus and ILBBB but no HAVB.  Pain is improving. Continue on aspirin and plavix. SBE prophylaxis discussed; I have RX'd amoxicillin. I will see him back next month for echo and follow up.   AKI: creat up to 1.49 at discharge. Back on home HCTZ. Will check a BMET today.   HTN: BP well controlled   Hypokalemia: on Kdur qod, will check BMET today  GERD: Prilosec was changed to Protonix given potential drug drug interaction with Plavix.  Medication Adjustments/Labs and Tests Ordered: Current medicines are reviewed at length with the patient today.  Concerns regarding medicines are outlined above.  Medication changes, Labs and Tests ordered today are listed in the Patient Instructions  below. Patient Instructions  Medication Instructions:  You have been called in AMOXIL 2,000 mg to take 1 hour prior to all dental visits.  Labwork: TODAY: BMET  Follow-Up: You are scheduled for your 1 month TAVR echo and office visit on 08/16/19. Please arrive at 1:50PM for check-in.    Signed, Angelena Form, PA-C  07/25/2019 4:04 PM    [facility] [address], [place], [place]  [address] Phone: [phone]; Fax: [phone]

## 2019-07-25 ENCOUNTER — Ambulatory Visit: Payer: PPO | Admitting: Physician Assistant

## 2019-07-25 ENCOUNTER — Encounter: Payer: Self-pay | Admitting: Physician Assistant

## 2019-07-25 ENCOUNTER — Other Ambulatory Visit: Payer: Self-pay

## 2019-07-25 VITALS — BP 110/78 | HR 91 | Ht 69.0 in | Wt 185.1 lb

## 2019-07-25 DIAGNOSIS — I471 Supraventricular tachycardia: Secondary | ICD-10-CM

## 2019-07-25 DIAGNOSIS — E876 Hypokalemia: Secondary | ICD-10-CM

## 2019-07-25 DIAGNOSIS — Z952 Presence of prosthetic heart valve: Secondary | ICD-10-CM

## 2019-07-25 DIAGNOSIS — K219 Gastro-esophageal reflux disease without esophagitis: Secondary | ICD-10-CM | POA: Diagnosis not present

## 2019-07-25 DIAGNOSIS — I10 Essential (primary) hypertension: Secondary | ICD-10-CM

## 2019-07-25 DIAGNOSIS — N179 Acute kidney failure, unspecified: Secondary | ICD-10-CM | POA: Diagnosis not present

## 2019-07-25 DIAGNOSIS — I4719 Other supraventricular tachycardia: Secondary | ICD-10-CM

## 2019-07-25 MED ORDER — AMOXICILLIN 500 MG PO TABS: ORAL_TABLET | ORAL | 8 refills | Status: DC

## 2019-07-25 NOTE — Patient Instructions (Addendum)
Medication Instructions:  You have been called in AMOXIL 2,000 mg to take 1 hour prior to all dental visits.  Labwork: TODAY: BMET  Follow-Up: You are scheduled for your 1 month TAVR echo and office visit on 08/16/19. Please arrive at 1:50PM for check-in.

## 2019-07-26 LAB — BASIC METABOLIC PANEL
BUN/Creatinine Ratio: 16 (ref 10–24)
BUN: 17 mg/dL (ref 8–27)
CO2: 22 mmol/L (ref 20–29)
Calcium: 9.3 mg/dL (ref 8.6–10.2)
Chloride: 103 mmol/L (ref 96–106)
Creatinine, Ser: 1.09 mg/dL (ref 0.76–1.27)
GFR calc Af Amer: 78 mL/min/{1.73_m2} (ref >59)
GFR calc non Af Amer: 67 mL/min/{1.73_m2} (ref >59)
Glucose: 88 mg/dL (ref 65–99)
Potassium: 3.6 mmol/L (ref 3.5–5.2)
Sodium: 141 mmol/L (ref 134–144)

## 2019-08-01 DIAGNOSIS — E876 Hypokalemia: Secondary | ICD-10-CM | POA: Diagnosis not present

## 2019-08-01 DIAGNOSIS — N179 Acute kidney failure, unspecified: Secondary | ICD-10-CM | POA: Diagnosis not present

## 2019-08-01 DIAGNOSIS — S39012A Strain of muscle, fascia and tendon of lower back, initial encounter: Secondary | ICD-10-CM | POA: Diagnosis not present

## 2019-08-01 DIAGNOSIS — I10 Essential (primary) hypertension: Secondary | ICD-10-CM | POA: Diagnosis not present

## 2019-08-01 DIAGNOSIS — I350 Nonrheumatic aortic (valve) stenosis: Secondary | ICD-10-CM | POA: Diagnosis not present

## 2019-08-13 DIAGNOSIS — R600 Localized edema: Secondary | ICD-10-CM | POA: Diagnosis not present

## 2019-08-16 ENCOUNTER — Other Ambulatory Visit: Payer: Self-pay | Admitting: Physician Assistant

## 2019-08-16 ENCOUNTER — Encounter (INDEPENDENT_AMBULATORY_CARE_PROVIDER_SITE_OTHER): Payer: Self-pay

## 2019-08-16 ENCOUNTER — Other Ambulatory Visit: Payer: Self-pay

## 2019-08-16 ENCOUNTER — Ambulatory Visit (HOSPITAL_COMMUNITY): Payer: PPO | Attending: Cardiovascular Disease

## 2019-08-16 ENCOUNTER — Encounter: Payer: Self-pay | Admitting: Physician Assistant

## 2019-08-16 ENCOUNTER — Ambulatory Visit: Payer: PPO | Admitting: Physician Assistant

## 2019-08-16 VITALS — BP 122/78 | HR 75 | Ht 69.0 in | Wt 181.1 lb

## 2019-08-16 DIAGNOSIS — I471 Supraventricular tachycardia: Secondary | ICD-10-CM | POA: Diagnosis not present

## 2019-08-16 DIAGNOSIS — K219 Gastro-esophageal reflux disease without esophagitis: Secondary | ICD-10-CM | POA: Diagnosis not present

## 2019-08-16 DIAGNOSIS — E785 Hyperlipidemia, unspecified: Secondary | ICD-10-CM

## 2019-08-16 DIAGNOSIS — I10 Essential (primary) hypertension: Secondary | ICD-10-CM

## 2019-08-16 DIAGNOSIS — Z952 Presence of prosthetic heart valve: Secondary | ICD-10-CM | POA: Diagnosis not present

## 2019-08-16 MED ORDER — AMLODIPINE BESYLATE 5 MG PO TABS: 5.0000 mg | ORAL_TABLET | Freq: Every day | ORAL | 11 refills | Status: DC

## 2019-08-16 NOTE — Progress Notes (Signed)
[name]                                       Cardiology Office Note    Date:  08/21/2019   ID:  BOWMAN BLAZINA, DOB 03/08/1946, MRN [id number]  PCP:  Randall Dress, MD  Cardiologist: Dr. Geraldo Edwards / Dr. Burt Edwards & Dr. Cyndia Edwards (TAVR)  CC: 1 month s/p TAVR  History of Present Illness:  Randall Edwards is a 73 y.o. male with a history of HTN, HLD, s/p bioprosthetic Bentall procedurein 2010 byDr. Cyndia Edwards, scar related focal atrial tachycardia (some places documented as afib) s/p ablation by Dr. Minna Edwards on Sotalol (not on OAC)and severe bioprosthetic AS s/p valve in valve TAVR (07/17/19) who present to clinic for follow up.   He had a 25 mm Edwards Magna-Easepericardial valve insidea Gelweave Valsalva graft. The valvemodel [id number] TFX. He has done well since that surgery now presents with a couple month history of exertional fatigue such as doing yard work. His wife said that he has to rest frequently and is tired at the end of the day. He has a history of atrial fibrillation/tachyardiaand underwent ablation in 2017. He continues to take sotalol for rhythm control.He has had periodic echocardiograms to follow-up on his prosthetic valve. His most recent echocardiogram 05/14/2011 showed an increase in the mean gradient to 36.5 mmHg with a peak gradient of 60 mm. The dimensionless index of 0.26. Left ventricular ejection fraction was 60 to 65%. His mean aortic valve gradient 1 year ago was 22 mmHg.  Patient was originally brought in on 07/03/2023 TAVR via the TF approach. However, we were unable to pass a guidewire across the valve and surgery was aborted. He underwent successful TAVR with a 26 mm Edwards Sapien 3 Ultra THV via the TA approach on 07/17/19. Post operative echo showed EF 55-60% with normally functioning TAVR with a mean gradient of 13.5 mmHg and no PVL. ECG with NSR with old first-degree AV block and new LBBB (122 ms.) He  had AKI with creat up to 1.49. His BP was also elevated and he was started on Norvasc. He was continued on aspirin and started on plavix. Repeat BMET at follow up showed kidney function improvement to creat 1.09 and normal potassium on Kdur QOD.  Today he presents to clinic for follow up; here with his son. Has some numbness and mild swelling over inframammary incision. No chest pain or shortness of breath. He has taken longer to recover than he expected. Otherwise he feels like he like he is getting stronger every day. Still having some numbness in his wrist where they had an IV in the hospital. He was recently seen by PCP for new LE edema. PCP added lasix 20mg  daily and asked him to take extra potassium. LE edema has now resolved.   Past Medical History:  Diagnosis Date  . Atrial tachycardia ([place])    s/p ablation and on sotolol  . BPH (benign prostatic hyperplasia)   . GERD (gastroesophageal reflux disease)   . H/O cardiac radiofrequency ablation 08/2016  . Hyperlipidemia   . Hypertension   . S/P aorta repair   . S/P AVR (aortic valve replacement) 2010  . s/p valve-in-valve TAVR 07/17/2019   s/p VIV TAVR via the TA approach after failed TF case 07/03/19  . Vertigo     Past Surgical  History:  Procedure Laterality Date  . ABLATION OF DYSRHYTHMIC FOCUS    . CARDIAC VALVE REPLACEMENT    . CHOLECYSTECTOMY    . KIDNEY STONE SURGERY    . KNEE ARTHROSCOPY Bilateral   . MASS EXCISION Right 12/26/2017   Procedure: RIGHT INDEX EXCISION CYST AND DEBRIDEMENT OF DISTAL INTERPHALANGEAL JOINT;  Surgeon: Randall Cover, MD;  Location: [facility];  Service: Orthopedics;  Laterality: Right;  Bier block  . RIGHT HEART CATH AND CORONARY ANGIOGRAPHY N/A 06/20/2019   Procedure: RIGHT HEART CATH AND CORONARY ANGIOGRAPHY;  Surgeon: Randall Mocha, MD;  Location: [facility];  Service: Cardiovascular;  Laterality: N/A;  . TEE WITHOUT CARDIOVERSION N/A 07/03/2019   Procedure:  TRANSESOPHAGEAL ECHOCARDIOGRAM (TEE);  Surgeon: Randall Mocha, MD;  Location: [facility];  Service: Open Heart Surgery;  Laterality: N/A;  . TEE WITHOUT CARDIOVERSION N/A 07/17/2019   Procedure: TRANSESOPHAGEAL ECHOCARDIOGRAM (TEE);  Surgeon: Randall Mocha, MD;  Location: [facility];  Service: Open Heart Surgery;  Laterality: N/A;  . TRANSCATHETER AORTIC VALVE REPLACEMENT, TRANSAPICAL N/A 07/17/2019   Procedure: TRANSCATHETER AORTIC VALVE REPLACEMENT, TRANSAPICAL;  Surgeon: Randall Mocha, MD;  Location: [facility];  Service: Open Heart Surgery;  Laterality: N/A;  . TRANSCATHETER AORTIC VALVE REPLACEMENT, TRANSFEMORAL N/A 07/03/2019   Procedure: attempted TRANSCATHETER AORTIC VALVE REPLACEMENT, TRANSFEMORAL;  Surgeon: Randall Mocha, MD;  Location: [facility];  Service: Open Heart Surgery;  Laterality: N/A;    Current Medications: Outpatient Medications Prior to Visit  Medication Sig Dispense Refill  . amoxicillin (AMOXIL) 500 MG tablet Take 4 capsules (2,000 mg) one hour prior to all dental visits. 8 tablet 8  . aspirin EC 81 MG tablet Take 81 mg by mouth daily.     . clopidogrel (PLAVIX) 75 MG tablet Take 1 tablet (75 mg total) by mouth daily with breakfast. 90 tablet 1  . fexofenadine (ALLEGRA) 180 MG tablet Take 180 mg by mouth 2 (two) times daily.     Marland Kitchen oxyCODONE-acetaminophen (PERCOCET) 5-325 MG tablet Take 1 tablet by mouth every 4 (four) hours as needed for severe pain. 20 tablet 0  . pantoprazole (PROTONIX) 40 MG tablet Take 1 tablet (40 mg total) by mouth daily. 90 tablet 1  . potassium chloride (KLOR-CON) 10 MEQ tablet Take 10 mEq by mouth daily.    . rosuvastatin (CRESTOR) 10 MG tablet TAKE ONE TABLET BY MOUTH EVERY DAY IN THE EVENING 90 tablet 3  . sodium chloride (OCEAN) 0.65 % SOLN nasal spray Place 1 spray into both nostrils 3 (three) times daily as needed for congestion.    . sotalol (BETAPACE) 80 MG tablet Take 40 mg by mouth 2 (two) times daily.    . tamsulosin (FLOMAX) 0.4 MG CAPS capsule  Take 0.4 mg by mouth daily after supper.     . temazepam (RESTORIL) 15 MG capsule Take 15 mg by mouth at bedtime.     Marland Kitchen tiZANidine (ZANAFLEX) 4 MG tablet Take 4 mg by mouth 3 (three) times daily as needed.    . triamterene-hydrochlorothiazide (MAXZIDE-25) 37.5-25 MG tablet TAKE ONE TABLET BY MOUTH EVERY DAY 90 tablet 1  . amLODipine (NORVASC) 10 MG tablet Take 1 tablet (10 mg total) by mouth daily. 90 tablet 3  . furosemide (LASIX) 20 MG tablet Take 20 mg by mouth daily.    . potassium chloride (KLOR-CON) 10 MEQ tablet Take 1 tablet (10 mEq total) by mouth daily. 30 tablet 6   No facility-administered medications prior to visit.  Allergies:   Patient has no known allergies.   Social History   Socioeconomic History  . Marital status: Married    Spouse name: Not on file  . Number of children: Not on file  . Years of education: Not on file  . Highest education level: Not on file  Occupational History  . Not on file  Social Needs  . Financial resource strain: Not on file  . Food insecurity    Worry: Not on file    Inability: Not on file  . Transportation needs    Medical: Not on file    Non-medical: Not on file  Tobacco Use  . Smoking status: Never Smoker  . Smokeless tobacco: Never Used  Substance and Sexual Activity  . Alcohol use: No    Frequency: Never  . Drug use: No  . Sexual activity: Not on file  Lifestyle  . Physical activity    Days per week: Not on file    Minutes per session: Not on file  . Stress: Not on file  Relationships  . Social Herbalist on phone: Not on file    Gets together: Not on file    Attends religious service: Not on file    Active member of club or organization: Not on file    Attends meetings of clubs or organizations: Not on file    Relationship status: Not on file  Other Topics Concern  . Not on file  Social History Narrative  . Not on file     Family History:  The patient's family history includes CAD in his brother.      ROS:   Please see the history of present illness.    ROS All other systems reviewed and are negative.   PHYSICAL EXAM:   VS:  BP 122/78   Pulse 75   Ht 5\' 9"  (1.753 m)   Wt 181 lb 1.9 oz (82.2 kg)   SpO2 97%   BMI 26.75 kg/m    GEN: Well nourished, well developed, in no acute distress HEENT: normal Neck: no JVD or masses Cardiac: RRR; soft flow murmur. No rubs, or gallops,no edema  Respiratory:  clear to auscultation bilaterally, normal work of breathing GI: soft, nontender, nondistended, + BS MS: no deformity or atrophy Skin: warm and dry, no rash.  Neuro:  Alert and Oriented x 3, Strength and sensation are intact Psych: euthymic mood, full affect   Wt Readings from Last 3 Encounters:  08/16/19 181 lb 1.9 oz (82.2 kg)  07/25/19 185 lb 1.9 oz (84 kg)  07/19/19 194 lb 0.1 oz (88 kg)      Studies/Labs Reviewed:   EKG:  EKG is NOT ordered today.    Recent Labs: 07/13/2019: ALT 21; B Natriuretic Peptide 72.0 07/18/2019: Magnesium 1.8 07/19/2019: Hemoglobin 12.2; Platelets 116 08/16/2019: BUN 16; Creatinine, Ser 1.00; Potassium 3.5; Sodium 140   Lipid Panel No results found for: CHOL, TRIG, HDL, CHOLHDL, VLDL, LDLCALC, LDLDIRECT  Additional studies/ records that were reviewed today include:  CARDIOTHORACIC SURGERY OPERATIVE NOTE  Date of Procedure:07/17/2019  Preoperative Diagnosis:Severe Aortic Stenosis   Postoperative Diagnosis:Same   Procedure:   Transcatheter Aortic Valve Replacement( Valve in Valve)- Transapical Approach Edwards Sapien 3(size 69mm, model # [id number], serial [id number])  Co-Surgeons:Bryan Alveria Apley, MD and Randall Mocha, MD   Anesthesiologist:R. Ola Spurr, MD  Anne Hahn, MD  Pre-operative Echo Findings: ? Severebioprostheticaortic stenosis ? Normalleft ventricular systolic function    Post-operative Echo Findings: ? Noparavalvular leak ?  Normalleft ventricular systolic function _____________    Echo 07/18/19 IMPRESSIONS 1. Left ventricular ejection fraction, by visual estimation, is 55 to 60%. The left ventricle has normal function. There is no left ventricular hypertrophy. 2. Elevated left ventricular end-diastolic pressure. 3. Left ventricular diastolic parameters are consistent with Grade II diastolic dysfunction (pseudonormalization). 4. Global right ventricle has normal systolic function.The right ventricular size is normal. No increase in right ventricular wall thickness. 5. Left atrial size was mildly dilated. 6. Right atrial size was normal. 7. The mitral valve is normal in structure. Mild mitral valve regurgitation. No evidence of mitral stenosis. 8. The tricuspid valve is normal in structure. Tricuspid valve regurgitation is not demonstrated. 9. 47mm Edwards Sapien bioprosthetic, stented aortic valve (TAVR) valve is present in the aortic position. Aortic valve mean gradient measures 13.5 mmHg. Aortic valve peak gradient measures 24.9 mmHg. Aortic valve area, by VTI measures 1.46 cm.  Dimensionless index is 0.42. No perivalvular AI is present. 10. IVC normal in size with greater than 50% respiratory variability, suggesting right atrial pressure of 3 mmHg  ____________    Echo 08/16/19 IMPRESSIONS  1. Left ventricular ejection fraction, by visual estimation, is 65 to 70%. The left ventricle has hyperdynamic function. There is severely increased left ventricular hypertrophy.  2. Elevated left ventricular end-diastolic pressure.  3. Left ventricular diastolic parameters are consistent with Grade I diastolic dysfunction (impaired relaxation).  4. Global right ventricle has normal systolic function.The right ventricular size is normal. No increase in right ventricular wall thickness.  5. Left atrial size was moderately dilated.  6. Right atrial size  was normal.  7. The mitral valve is normal in structure. No evidence of mitral valve regurgitation. No evidence of mitral stenosis.  8. The tricuspid valve is normal in structure. Tricuspid valve regurgitation is mild.  9. Aortic valve regurgitation is not visualized. No evidence of aortic valve sclerosis or stenosis. 10. The pulmonic valve was normal in structure. Pulmonic valve regurgitation is not visualized. 11. The inferior vena cava is normal in size with greater than 50% respiratory variability, suggesting right atrial pressure of 3 mmHg. 12. Stable transaortic gradients across 23 mmEdwards SAPIEN 3 Ultra TAVR valve with peak/mean gradients 31/15 mmHg and no paravalvular leak. AVA 1.8 cm2.  ASSESSMENT & PLAN:   Severe AS s/p valve in valve TAVR: echo today shows EF 65%, normally functioning TAVR with a mean gradient of 15 mm Hg and no PVL. He has NYHA class I symptoms. SBE prophylaxis discussed; he has amovicillin. Plavix can be discontinued after 6 months of therapy (01/2020). I will see him back in 1 year.   HTN: Bp well controlled today at 122/78. He has had new LE edema and started on lasix/ extra potassium by PCP. He would like to stop taking lasix. I think his LE edema is likely from recent start of high dose amlodipine 10mg  daily. Plan to decrease dose to 5mg  and stop lasix/ extra Kdur now. He will watch his BP and see if LE edema resolves. He will call our office if his BP become elevated over goal. Continue Maxide-25mg  daily. Check BMET today.   GERD: Prilosec was changed to Protonix given potential drug drug interaction with Plavix. When he comes off plavix after 6 months he can resume Prilosec.   Atiral tach s/p ablation: continue on sotalol  HLD: continue statin   Medication Adjustments/Labs and Tests Ordered: Current medicines are reviewed at length with the patient today.  Concerns regarding medicines are outlined above.  Medication changes, Labs and Tests ordered today are  listed in the Patient Instructions below. Patient Instructions  Medication Instructions:  1) STOP LASIX  2) DECREASE POTASSIUM to 10 meq daily 3) DECREASE AMLODIPINE to 5 mg daily 4) You may STOP PLAVIX 01/14/2020. When you stop Plavix, you may STOP Protonix and restart Prilosec.  Labwork: TODAY: BMET  Follow-Up: You will be called to arrange your 1 year TAVR echo and office visit.   Please keep a log of your blood pressures. Please call us at [phone] if your blood pressure is consistently above 130/80.    Signed, Angelena Form, PA-C  08/21/2019 8:52 AM    [facility] [address], [place], [name]  [address] Phone: [phone]; Fax: [phone]

## 2019-08-16 NOTE — Patient Instructions (Addendum)
Medication Instructions:  1) STOP LASIX  2) DECREASE POTASSIUM to 10 meq daily 3) DECREASE AMLODIPINE to 5 mg daily 4) You may STOP PLAVIX 01/14/2020. When you stop Plavix, you may STOP Protonix and restart Prilosec.  Labwork: TODAY: BMET  Follow-Up: You will be called to arrange your 1 year TAVR echo and office visit.   Please keep a log of your blood pressures. Please call us at [phone] if your blood pressure is consistently above 130/80.

## 2019-08-17 LAB — BASIC METABOLIC PANEL
BUN/Creatinine Ratio: 16 (ref 10–24)
BUN: 16 mg/dL (ref 8–27)
CO2: 23 mmol/L (ref 20–29)
Calcium: 9.8 mg/dL (ref 8.6–10.2)
Chloride: 101 mmol/L (ref 96–106)
Creatinine, Ser: 1 mg/dL (ref 0.76–1.27)
GFR calc Af Amer: 87 mL/min/{1.73_m2} (ref >59)
GFR calc non Af Amer: 75 mL/min/{1.73_m2} (ref >59)
Glucose: 99 mg/dL (ref 65–99)
Potassium: 3.5 mmol/L (ref 3.5–5.2)
Sodium: 140 mmol/L (ref 134–144)

## 2019-08-27 ENCOUNTER — Telehealth: Payer: Self-pay | Admitting: Physician Assistant

## 2019-08-27 NOTE — Telephone Encounter (Signed)
  [facility]   MULTIDISCIPLINARY HEART VALVE TEAM  Reviewed BP log with patient and in very good range. Ave ~125/70. LE edema improved on decreased dose of Norvasc. Will keep meds the same.   Angelena Form PA-C  [facility]

## 2019-09-19 ENCOUNTER — Other Ambulatory Visit: Payer: Self-pay | Admitting: Cardiology

## 2019-09-19 NOTE — Telephone Encounter (Signed)
Please forward to Dr Ervin Knack nurse to handle refills.  These 2 medications should be filled by general cardiology, not the TAVR team.   Thank you!

## 2019-09-27 DIAGNOSIS — M545 Low back pain: Secondary | ICD-10-CM | POA: Diagnosis not present

## 2019-10-02 ENCOUNTER — Telehealth: Payer: Self-pay | Admitting: Cardiology

## 2019-10-02 DIAGNOSIS — R972 Elevated prostate specific antigen [PSA]: Secondary | ICD-10-CM | POA: Diagnosis not present

## 2019-10-02 DIAGNOSIS — N538 Other male sexual dysfunction: Secondary | ICD-10-CM | POA: Diagnosis not present

## 2019-10-02 DIAGNOSIS — N3020 Other chronic cystitis without hematuria: Secondary | ICD-10-CM | POA: Diagnosis not present

## 2019-10-02 DIAGNOSIS — N401 Enlarged prostate with lower urinary tract symptoms: Secondary | ICD-10-CM | POA: Diagnosis not present

## 2019-10-02 NOTE — Telephone Encounter (Signed)
Patient came by the office and he wanted your "ok" to have the COVID vaccine since he just had valve replacement. He insisted to check with you first..

## 2019-10-02 NOTE — Telephone Encounter (Signed)
Patient informed that it is ok to proceed with cvd19 vaccine. Note routed to RRR for additional input.

## 2019-10-04 DIAGNOSIS — M545 Low back pain: Secondary | ICD-10-CM | POA: Diagnosis not present

## 2019-10-04 DIAGNOSIS — M6281 Muscle weakness (generalized): Secondary | ICD-10-CM | POA: Diagnosis not present

## 2019-10-10 ENCOUNTER — Ambulatory Visit: Payer: PPO | Admitting: Cardiology

## 2019-10-10 DIAGNOSIS — M545 Low back pain: Secondary | ICD-10-CM | POA: Diagnosis not present

## 2019-10-10 DIAGNOSIS — M6281 Muscle weakness (generalized): Secondary | ICD-10-CM | POA: Diagnosis not present

## 2019-10-12 DIAGNOSIS — I10 Essential (primary) hypertension: Secondary | ICD-10-CM | POA: Diagnosis not present

## 2019-10-12 DIAGNOSIS — E785 Hyperlipidemia, unspecified: Secondary | ICD-10-CM | POA: Diagnosis not present

## 2019-10-12 DIAGNOSIS — I480 Paroxysmal atrial fibrillation: Secondary | ICD-10-CM | POA: Diagnosis not present

## 2019-10-12 DIAGNOSIS — Z79899 Other long term (current) drug therapy: Secondary | ICD-10-CM | POA: Diagnosis not present

## 2019-10-12 DIAGNOSIS — F5104 Psychophysiologic insomnia: Secondary | ICD-10-CM | POA: Diagnosis not present

## 2019-10-12 DIAGNOSIS — R972 Elevated prostate specific antigen [PSA]: Secondary | ICD-10-CM | POA: Diagnosis not present

## 2019-10-17 DIAGNOSIS — Z20828 Contact with and (suspected) exposure to other viral communicable diseases: Secondary | ICD-10-CM | POA: Diagnosis not present

## 2019-10-17 DIAGNOSIS — J3489 Other specified disorders of nose and nasal sinuses: Secondary | ICD-10-CM | POA: Diagnosis not present

## 2019-10-18 ENCOUNTER — Other Ambulatory Visit: Payer: Self-pay | Admitting: Cardiology

## 2019-10-18 NOTE — Telephone Encounter (Signed)
Rx refill sent to pharmacy. 

## 2019-10-24 DIAGNOSIS — M6281 Muscle weakness (generalized): Secondary | ICD-10-CM | POA: Diagnosis not present

## 2019-10-24 DIAGNOSIS — M545 Low back pain: Secondary | ICD-10-CM | POA: Diagnosis not present

## 2019-10-31 DIAGNOSIS — M545 Low back pain: Secondary | ICD-10-CM | POA: Diagnosis not present

## 2019-10-31 DIAGNOSIS — M6281 Muscle weakness (generalized): Secondary | ICD-10-CM | POA: Diagnosis not present

## 2019-11-06 DIAGNOSIS — B9689 Other specified bacterial agents as the cause of diseases classified elsewhere: Secondary | ICD-10-CM | POA: Diagnosis not present

## 2019-11-06 DIAGNOSIS — J019 Acute sinusitis, unspecified: Secondary | ICD-10-CM | POA: Diagnosis not present

## 2019-11-07 DIAGNOSIS — M545 Low back pain: Secondary | ICD-10-CM | POA: Diagnosis not present

## 2019-11-07 DIAGNOSIS — M6281 Muscle weakness (generalized): Secondary | ICD-10-CM | POA: Diagnosis not present

## 2019-11-13 DIAGNOSIS — K118 Other diseases of salivary glands: Secondary | ICD-10-CM | POA: Diagnosis not present

## 2019-11-13 DIAGNOSIS — J029 Acute pharyngitis, unspecified: Secondary | ICD-10-CM | POA: Diagnosis not present

## 2019-11-14 DIAGNOSIS — M6281 Muscle weakness (generalized): Secondary | ICD-10-CM | POA: Diagnosis not present

## 2019-11-14 DIAGNOSIS — M545 Low back pain: Secondary | ICD-10-CM | POA: Diagnosis not present

## 2019-11-28 ENCOUNTER — Telehealth: Payer: Self-pay | Admitting: Cardiology

## 2019-11-28 NOTE — Telephone Encounter (Signed)
I spoke with patient who requests his wife attend appointment with him because he is hard of hearing. He reports she has been attending all recent office visits with him I told patient I would make a note that his wife would attend the visit with him on 3/22

## 2019-11-28 NOTE — Telephone Encounter (Signed)
Patient would like to know if he wife can come with him to his appt on 3/22 as he is hard of hearing.

## 2019-12-03 ENCOUNTER — Other Ambulatory Visit: Payer: Self-pay

## 2019-12-03 ENCOUNTER — Ambulatory Visit (INDEPENDENT_AMBULATORY_CARE_PROVIDER_SITE_OTHER): Payer: PPO | Admitting: Cardiology

## 2019-12-03 ENCOUNTER — Encounter: Payer: Self-pay | Admitting: Cardiology

## 2019-12-03 VITALS — BP 118/72 | HR 65 | Temp 97.9°F | Resp 18 | Ht 69.0 in | Wt 174.1 lb

## 2019-12-03 DIAGNOSIS — Z952 Presence of prosthetic heart valve: Secondary | ICD-10-CM | POA: Diagnosis not present

## 2019-12-03 DIAGNOSIS — Z9889 Other specified postprocedural states: Secondary | ICD-10-CM

## 2019-12-03 DIAGNOSIS — I10 Essential (primary) hypertension: Secondary | ICD-10-CM

## 2019-12-03 DIAGNOSIS — Z8679 Personal history of other diseases of the circulatory system: Secondary | ICD-10-CM

## 2019-12-03 NOTE — Progress Notes (Signed)
Cardiology Office Note:    Date:  12/03/2019   ID:  Randall Edwards, DOB 08-Mar-1946, MRN [id number]  PCP:  Nicoletta Dress, MD  Cardiologist:  Jenean Lindau, MD   Referring MD: Nicoletta Dress, MD    ASSESSMENT:    1. Essential hypertension   2. s/p valve-in-valve TAVR   3. Status post radiofrequency ablation for arrhythmia    PLAN:    In order of problems listed above:  1. Post aortic valve replacement with stenosis, post TAVR: Patient is very happy with his progress.  He denies any problems at this time and ambulating well without any symptoms.  Echocardiogram report was reviewed with him at length and I discussed it with him.  Questions were answered to his satisfaction.  Report of the echocardiogram post TAVR is discussed below. 2. Essential hypertension: Blood pressure is stable 3. Mixed dyslipidemia: Lipids reviewed and followed by primary care physician. 4. Patient will be seen in follow-up appointment in 6 months or earlier if the patient has any concerns    Medication Adjustments/Labs and Tests Ordered: Current medicines are reviewed at length with the patient today.  Concerns regarding medicines are outlined above.  No orders of the defined types were placed in this encounter.  No orders of the defined types were placed in this encounter.    Chief Complaint  Patient presents with  . Follow-up    no complaints.     History of Present Illness:    Randall Edwards is a 74 y.o. male.  Patient has past medical history of essential hypertension, dyslipidemia and aortic valve replacement.  He developed aortic stenosis and underwent TAVR surgery for valve replacement and subsequently has done well.  No chest pain orthopnea or PND.  At the time of my evaluation, the patient is alert awake oriented and in no distress.  Past Medical History:  Diagnosis Date  . Ascending aorta dilatation ([place]) 05/16/2019  . Atrial tachycardia ([place])    s/p ablation and on sotolol    . BPH (benign prostatic hyperplasia)   . Chronic frontal sinusitis 09/12/2014  . Chronic sphenoidal sinusitis 09/12/2014  . Dyslipidemia 03/18/2015  . Essential hypertension 03/18/2015  . GERD (gastroesophageal reflux disease)   . H/O cardiac radiofrequency ablation 08/2016  . Hyperlipidemia   . Hypertension   . Hypertrophy of both inferior nasal turbinates 09/12/2014  . Multiple nasal polyps 09/12/2014  . Nasal septal deviation 09/12/2014  . S/P aorta repair   . S/P AVR (aortic valve replacement) 2010  . s/p valve-in-valve TAVR 07/17/2019   s/p VIV TAVR via the TA approach after failed TF case 07/03/19  . Severe aortic stenosis 05/16/2019  . Status post radiofrequency ablation for arrhythmia 03/18/2015  . Vertigo     Past Surgical History:  Procedure Laterality Date  . ABLATION OF DYSRHYTHMIC FOCUS    . CARDIAC VALVE REPLACEMENT    . CHOLECYSTECTOMY    . KIDNEY STONE SURGERY    . KNEE ARTHROSCOPY Bilateral   . MASS EXCISION Right 12/26/2017   Procedure: RIGHT INDEX EXCISION CYST AND DEBRIDEMENT OF DISTAL INTERPHALANGEAL JOINT;  Surgeon: Leanora Cover, MD;  Location: [facility];  Service: Orthopedics;  Laterality: Right;  Bier block  . RIGHT HEART CATH AND CORONARY ANGIOGRAPHY N/A 06/20/2019   Procedure: RIGHT HEART CATH AND CORONARY ANGIOGRAPHY;  Surgeon: Sherren Mocha, MD;  Location: [facility];  Service: Cardiovascular;  Laterality: N/A;  . TEE WITHOUT CARDIOVERSION N/A 07/03/2019  Procedure: TRANSESOPHAGEAL ECHOCARDIOGRAM (TEE);  Surgeon: Sherren Mocha, MD;  Location: [facility];  Service: Open Heart Surgery;  Laterality: N/A;  . TEE WITHOUT CARDIOVERSION N/A 07/17/2019   Procedure: TRANSESOPHAGEAL ECHOCARDIOGRAM (TEE);  Surgeon: Sherren Mocha, MD;  Location: [facility];  Service: Open Heart Surgery;  Laterality: N/A;  . TRANSCATHETER AORTIC VALVE REPLACEMENT, TRANSAPICAL N/A 07/17/2019   Procedure: TRANSCATHETER AORTIC VALVE REPLACEMENT, TRANSAPICAL;  Surgeon:  Sherren Mocha, MD;  Location: [facility];  Service: Open Heart Surgery;  Laterality: N/A;  . TRANSCATHETER AORTIC VALVE REPLACEMENT, TRANSFEMORAL N/A 07/03/2019   Procedure: attempted TRANSCATHETER AORTIC VALVE REPLACEMENT, TRANSFEMORAL;  Surgeon: Sherren Mocha, MD;  Location: [facility];  Service: Open Heart Surgery;  Laterality: N/A;    Current Medications: Current Meds  Medication Sig  . amLODipine (NORVASC) 5 MG tablet Take 1 tablet (5 mg total) by mouth daily.  Marland Kitchen amoxicillin (AMOXIL) 500 MG tablet Take 4 capsules (2,000 mg) one hour prior to all dental visits.  Marland Kitchen aspirin EC 81 MG tablet Take 81 mg by mouth daily.   . clopidogrel (PLAVIX) 75 MG tablet Take 1 tablet (75 mg total) by mouth daily with breakfast.  . fexofenadine (ALLEGRA) 180 MG tablet Take 180 mg by mouth 2 (two) times daily.   Marland Kitchen lidocaine (LIDODERM) 5 % Place 1 patch onto the skin daily. Remove & Discard patch within 12 hours or as directed by MD  . pantoprazole (PROTONIX) 40 MG tablet Take 1 tablet (40 mg total) by mouth daily.  . potassium chloride (KLOR-CON) 10 MEQ tablet Take 10 mEq by mouth daily.  . rosuvastatin (CRESTOR) 10 MG tablet TAKE ONE TABLET BY MOUTH EVERY DAY IN THE EVENING  . sotalol (BETAPACE) 80 MG tablet Take 1 tablet (80 mg total) by mouth daily.  . tamsulosin (FLOMAX) 0.4 MG CAPS capsule Take 0.4 mg by mouth daily after supper.   . temazepam (RESTORIL) 15 MG capsule Take 15 mg by mouth at bedtime.   Marland Kitchen tiZANidine (ZANAFLEX) 4 MG tablet Take 4 mg by mouth 3 (three) times daily as needed.  . traMADol (ULTRAM) 50 MG tablet Take 50 mg by mouth every 6 (six) hours as needed.  . triamterene-hydrochlorothiazide (MAXZIDE-25) 37.5-25 MG tablet TAKE ONE TABLET BY MOUTH EVERY DAY     Allergies:   Patient has no known allergies.   Social History   Socioeconomic History  . Marital status: Married    Spouse name: Not on file  . Number of children: Not on file  . Years of education: Not on file  . Highest  education level: Not on file  Occupational History  . Not on file  Tobacco Use  . Smoking status: Never Smoker  . Smokeless tobacco: Never Used  Substance and Sexual Activity  . Alcohol use: No  . Drug use: No  . Sexual activity: Not on file  Other Topics Concern  . Not on file  Social History Narrative  . Not on file   Social Determinants of Health   Financial Resource Strain:   . Difficulty of Paying Living Expenses:   Food Insecurity:   . Worried About Charity fundraiser in the Last Year:   . Arboriculturist in the Last Year:   Transportation Needs:   . Film/video editor (Medical):   Marland Kitchen Lack of Transportation (Non-Medical):   Physical Activity:   . Days of Exercise per Week:   . Minutes of Exercise per Session:   Stress:   . Feeling of Stress :  Social Connections:   . Frequency of Communication with Friends and Family:   . Frequency of Social Gatherings with Friends and Family:   . Attends Religious Services:   . Active Member of Clubs or Organizations:   . Attends Archivist Meetings:   Marland Kitchen Marital Status:      Family History: The patient's family history includes CAD in his brother.  ROS:   Please see the history of present illness.    All other systems reviewed and are negative.  EKGs/Labs/Other Studies Reviewed:    The following studies were reviewed today: I discussed my findings with the patient at extensive length.  IMPRESSIONS    1. Left ventricular ejection fraction, by visual estimation, is 65 to  70%. The left ventricle has hyperdynamic function. There is severely  increased left ventricular hypertrophy.  2. Elevated left ventricular end-diastolic pressure.  3. Left ventricular diastolic parameters are consistent with Grade I  diastolic dysfunction (impaired relaxation).  4. Global right ventricle has normal systolic function.The right  ventricular size is normal. No increase in right ventricular wall  thickness.  5.  Left atrial size was moderately dilated.  6. Right atrial size was normal.  7. The mitral valve is normal in structure. No evidence of mitral valve  regurgitation. No evidence of mitral stenosis.  8. The tricuspid valve is normal in structure. Tricuspid valve  regurgitation is mild.  9. Aortic valve regurgitation is not visualized. No evidence of aortic  valve sclerosis or stenosis.  10. The pulmonic valve was normal in structure. Pulmonic valve  regurgitation is not visualized.  11. The inferior vena cava is normal in size with greater than 50%  respiratory variability, suggesting right atrial pressure of 3 mmHg.  12. Stable transaortic gradients across 23 mmEdwards SAPIEN 3 Ultra TAVR  valve with peak/mean gradients 31/15 mmHg and no paravalvular leak. AVA  1.8 cm2.    Recent Labs: 07/13/2019: ALT 21; B Natriuretic Peptide 72.0 07/18/2019: Magnesium 1.8 07/19/2019: Hemoglobin 12.2; Platelets 116 08/16/2019: BUN 16; Creatinine, Ser 1.00; Potassium 3.5; Sodium 140  Recent Lipid Panel No results found for: CHOL, TRIG, HDL, CHOLHDL, VLDL, LDLCALC, LDLDIRECT  Physical Exam:    VS:  BP 118/72 (BP Location: Right Arm, Patient Position: Sitting, Cuff Size: Normal)   Pulse 65   Temp 97.9 F (36.6 C)   Resp 18   Ht 5\' 9"  (1.753 m)   Wt 174 lb 2 oz (79 kg)   SpO2 98%   BMI 25.71 kg/m     Wt Readings from Last 3 Encounters:  12/03/19 174 lb 2 oz (79 kg)  08/16/19 181 lb 1.9 oz (82.2 kg)  07/25/19 185 lb 1.9 oz (84 kg)     GEN: Patient is in no acute distress HEENT: Normal NECK: No JVD; No carotid bruits LYMPHATICS: No lymphadenopathy CARDIAC: Hear sounds regular, 2/6 systolic murmur at the apex. RESPIRATORY:  Clear to auscultation without rales, wheezing or rhonchi  ABDOMEN: Soft, non-tender, non-distended MUSCULOSKELETAL:  No edema; No deformity  SKIN: Warm and dry NEUROLOGIC:  Alert and oriented x 3 PSYCHIATRIC:  Normal affect   Signed, Jenean Lindau, MD    12/03/2019 11:30 AM    [facility]

## 2019-12-03 NOTE — Patient Instructions (Signed)
Medication Instructions:  No medication changes *If you need a refill on your cardiac medications before your next appointment, please call your pharmacy*   Lab Work: None ordered If you have labs (blood work) drawn today and your tests are completely normal, you will receive your results only by: Marland Kitchen MyChart Message (if you have MyChart) OR . A paper copy in the mail If you have any lab test that is abnormal or we need to change your treatment, we will call you to review the results.   Testing/Procedures: None orderd   Follow-Up: At [facility], you and your health needs are our priority.  As part of our continuing mission to provide you with exceptional heart care, we have created designated Provider Care Teams.  These Care Teams include your primary Cardiologist (physician) and Advanced Practice Providers (APPs -  Physician Assistants and Nurse Practitioners) who all work together to provide you with the care you need, when you need it.  We recommend signing up for the patient portal called "MyChart".  Sign up information is provided on this After Visit Summary.  MyChart is used to connect with patients for Virtual Visits (Telemedicine).  Patients are able to view lab/test results, encounter notes, upcoming appointments, etc.  Non-urgent messages can be sent to your provider as well.   To learn more about what you can do with MyChart, go to [email or web address].    Your next appointment:   3 month(s)  The format for your next appointment:   In Person  Provider:   Jyl Heinz, MD   Other Instructions NA

## 2019-12-03 NOTE — Addendum Note (Signed)
Addended by: Truddie Hidden on: 12/03/2019 11:44 AM   Modules accepted: Orders

## 2019-12-07 DIAGNOSIS — M5137 Other intervertebral disc degeneration, lumbosacral region: Secondary | ICD-10-CM | POA: Diagnosis not present

## 2019-12-07 DIAGNOSIS — R0982 Postnasal drip: Secondary | ICD-10-CM | POA: Diagnosis not present

## 2019-12-11 DIAGNOSIS — H903 Sensorineural hearing loss, bilateral: Secondary | ICD-10-CM | POA: Diagnosis not present

## 2019-12-13 ENCOUNTER — Other Ambulatory Visit: Payer: Self-pay | Admitting: Cardiology

## 2020-01-01 DIAGNOSIS — H903 Sensorineural hearing loss, bilateral: Secondary | ICD-10-CM | POA: Diagnosis not present

## 2020-01-08 ENCOUNTER — Other Ambulatory Visit: Payer: Self-pay | Admitting: Physician Assistant

## 2020-01-10 ENCOUNTER — Encounter: Payer: Self-pay | Admitting: Cardiology

## 2020-01-14 ENCOUNTER — Telehealth: Payer: Self-pay | Admitting: Cardiovascular Disease

## 2020-01-14 NOTE — Telephone Encounter (Signed)
Per KT's 12/3 note, "Plavix can be discontinued after 6 months of therapy (01/2020)."  Informed patient he may STOP PLAVIX. Reiterated to him to continue ASA and SBE prophylaxis. He was grateful for call and agrees with treatment plan.

## 2020-01-14 NOTE — Telephone Encounter (Signed)
Pt c/o medication issue:  1. Name of Medication: clopidogrel (PLAVIX) 75 MG tablet  2. How are you currently taking this medication (dosage and times per day)? As directed  3. Are you having a reaction (difficulty breathing--STAT)? no  4. What is your medication issue? Patient states that in November he had heart surgery. He had a f/u with Angelena Form and states that her discharge papers from 08/16/19 state that he can go off plavix today, 01/14/20. He states that his primary cardiologist, Dr. Geraldo Pitter, would like him to consult with either Angelena Form or Lenice Llamas before coming off. Please advise.

## 2020-01-16 DIAGNOSIS — R233 Spontaneous ecchymoses: Secondary | ICD-10-CM | POA: Diagnosis not present

## 2020-01-16 DIAGNOSIS — L820 Inflamed seborrheic keratosis: Secondary | ICD-10-CM | POA: Diagnosis not present

## 2020-01-16 DIAGNOSIS — L309 Dermatitis, unspecified: Secondary | ICD-10-CM | POA: Diagnosis not present

## 2020-03-03 DIAGNOSIS — M5416 Radiculopathy, lumbar region: Secondary | ICD-10-CM | POA: Diagnosis not present

## 2020-03-03 DIAGNOSIS — L0293 Carbuncle, unspecified: Secondary | ICD-10-CM | POA: Diagnosis not present

## 2020-03-07 ENCOUNTER — Other Ambulatory Visit: Payer: Self-pay | Admitting: Cardiology

## 2020-03-07 DIAGNOSIS — M5416 Radiculopathy, lumbar region: Secondary | ICD-10-CM | POA: Diagnosis not present

## 2020-03-07 DIAGNOSIS — M48061 Spinal stenosis, lumbar region without neurogenic claudication: Secondary | ICD-10-CM | POA: Diagnosis not present

## 2020-03-10 ENCOUNTER — Telehealth: Payer: Self-pay | Admitting: Cardiology

## 2020-03-10 ENCOUNTER — Ambulatory Visit: Payer: PPO | Admitting: Cardiology

## 2020-03-10 NOTE — Telephone Encounter (Signed)
Orva is calling requesting his wife attend his upcoming appointment scheduled for 03/11/20 due to him having trouble hearing and struggling to understand the Doctor while he is talking with a mask on.

## 2020-03-10 NOTE — Telephone Encounter (Signed)
Spoke to the patient just now and let him know that Dr. Geraldo Pitter would be fine with the patient having one visitor come back with him to help with his hearing impairment. He verbalizes understanding and thanks me for the call back.    Encouraged patient to call back with any questions or concerns.

## 2020-03-11 ENCOUNTER — Encounter: Payer: Self-pay | Admitting: Cardiology

## 2020-03-11 ENCOUNTER — Ambulatory Visit: Payer: PPO | Admitting: Cardiology

## 2020-03-11 ENCOUNTER — Other Ambulatory Visit: Payer: Self-pay

## 2020-03-11 VITALS — BP 112/70 | HR 64 | Ht 69.0 in | Wt 176.0 lb

## 2020-03-11 DIAGNOSIS — I10 Essential (primary) hypertension: Secondary | ICD-10-CM | POA: Diagnosis not present

## 2020-03-11 DIAGNOSIS — Z9889 Other specified postprocedural states: Secondary | ICD-10-CM | POA: Diagnosis not present

## 2020-03-11 DIAGNOSIS — Z8679 Personal history of other diseases of the circulatory system: Secondary | ICD-10-CM

## 2020-03-11 DIAGNOSIS — Z952 Presence of prosthetic heart valve: Secondary | ICD-10-CM

## 2020-03-11 NOTE — Progress Notes (Signed)
Cardiology Office Note:    Date:  03/11/2020   ID:  [name], DOB 1945/11/17, MRN [id number]  PCP:  Nicoletta Dress, MD  Cardiologist:  Jenean Lindau, MD   Referring MD: Nicoletta Dress, MD    ASSESSMENT:    1. Essential hypertension   2. S/P aorta repair   3. S/P AVR (aortic valve replacement)   4. s/p valve-in-valve TAVR   5. Status post radiofrequency ablation for arrhythmia    PLAN:    In order of problems listed above:  1. Primary prevention stressed with the patient.  Importance of compliance with diet medication stressed vocalized understanding. 2. Essential hypertension: Blood pressure is stable 3. Mixed dyslipidemia: Diet was emphasized and lipids followed by primary care physician. 4. Post aortic valve replacement: Stable at this time.  Patient ambulating well.  He has orthopedic issues with his lower back and is seeing a Psychologist, sport and exercise for injection. 5. Patient will be seen in follow-up appointment in 6 months or earlier if the patient has any concerns    Medication Adjustments/Labs and Tests Ordered: Current medicines are reviewed at length with the patient today.  Concerns regarding medicines are outlined above.  No orders of the defined types were placed in this encounter.  No orders of the defined types were placed in this encounter.    Chief Complaint  Patient presents with  . Follow-up     History of Present Illness:    Randall Edwards is a 74 y.o. male.  Patient has past medical history of aortic valve replacement, essential hypertension dyslipidemia.  He denies any problems at this time and takes care of activities of daily living.  No chest pain orthopnea or PND.  At the time of my evaluation, the patient is alert awake oriented and in no distress.  Past Medical History:  Diagnosis Date  . Ascending aorta dilatation ([place]) 05/16/2019  . Atrial tachycardia ([place])    s/p ablation and on sotolol  . BPH (benign prostatic hyperplasia)   .  Chronic frontal sinusitis 09/12/2014  . Chronic sphenoidal sinusitis 09/12/2014  . Dyslipidemia 03/18/2015  . Essential hypertension 03/18/2015  . GERD (gastroesophageal reflux disease)   . H/O cardiac radiofrequency ablation 08/2016  . Hyperlipidemia   . Hypertension   . Hypertrophy of both inferior nasal turbinates 09/12/2014  . Multiple nasal polyps 09/12/2014  . Nasal septal deviation 09/12/2014  . S/P aorta repair   . S/P AVR (aortic valve replacement) 2010  . s/p valve-in-valve TAVR 07/17/2019   s/p VIV TAVR via the TA approach after failed TF case 07/03/19  . Severe aortic stenosis 05/16/2019  . Status post radiofrequency ablation for arrhythmia 03/18/2015  . Vertigo     Past Surgical History:  Procedure Laterality Date  . ABLATION OF DYSRHYTHMIC FOCUS    . CARDIAC VALVE REPLACEMENT    . CHOLECYSTECTOMY    . KIDNEY STONE SURGERY    . KNEE ARTHROSCOPY Bilateral   . MASS EXCISION Right 12/26/2017   Procedure: RIGHT INDEX EXCISION CYST AND DEBRIDEMENT OF DISTAL INTERPHALANGEAL JOINT;  Surgeon: Leanora Cover, MD;  Location: [facility];  Service: Orthopedics;  Laterality: Right;  Bier block  . RIGHT HEART CATH AND CORONARY ANGIOGRAPHY N/A 06/20/2019   Procedure: RIGHT HEART CATH AND CORONARY ANGIOGRAPHY;  Surgeon: Sherren Mocha, MD;  Location: [facility];  Service: Cardiovascular;  Laterality: N/A;  . TEE WITHOUT CARDIOVERSION N/A 07/03/2019   Procedure: TRANSESOPHAGEAL ECHOCARDIOGRAM (TEE);  Surgeon: Sherren Mocha,  MD;  Location[facility];  Service: Open Heart Surgery;  Laterality: N/A;  . TEE WITHOUT CARDIOVERSION N/A 07/17/2019   Procedure: TRANSESOPHAGEAL ECHOCARDIOGRAM (TEE);  Surgeon: Sherren Mocha, MD;  Location: [facility];  Service: Open Heart Surgery;  Laterality: N/A;  . TRANSCATHETER AORTIC VALVE REPLACEMENT, TRANSAPICAL N/A 07/17/2019   Procedure: TRANSCATHETER AORTIC VALVE REPLACEMENT, TRANSAPICAL;  Surgeon: Sherren Mocha, MD;  Location: [facility];  Service:  Open Heart Surgery;  Laterality: N/A;  . TRANSCATHETER AORTIC VALVE REPLACEMENT, TRANSFEMORAL N/A 07/03/2019   Procedure: attempted TRANSCATHETER AORTIC VALVE REPLACEMENT, TRANSFEMORAL;  Surgeon: Sherren Mocha, MD;  Location: [facility] [facility];  Service: Open Heart Surgery;  Laterality: N/A;    Current Medications: Current Meds  Medication Sig  . amLODipine (NORVASC) 5 MG tablet Take 1 tablet (5 mg total) by mouth daily.  Marland Kitchen amoxicillin (AMOXIL) 500 MG tablet Take 4 capsules (2,000 mg) one hour prior to all dental visits.  Marland Kitchen aspirin EC 81 MG tablet Take 81 mg by mouth daily.   Marland Kitchen azelastine (ASTELIN) 0.1 % nasal spray Place 2 sprays into both nostrils 2 (two) times daily.  . clindamycin (CLEOCIN) 300 MG capsule Take 300 mg by mouth 3 (three) times daily.  . fexofenadine (ALLEGRA) 180 MG tablet Take 180 mg by mouth 2 (two) times daily.   . pantoprazole (PROTONIX) 40 MG tablet Take 1 tablet (40 mg total) by mouth daily.  . potassium chloride (KLOR-CON) 10 MEQ tablet Take 10 mEq by mouth daily.  . rosuvastatin (CRESTOR) 10 MG tablet TAKE ONE TABLET BY MOUTH EVERY DAY IN THE EVENING  . sotalol (BETAPACE) 80 MG tablet Take 1 tablet (80 mg total) by mouth daily.  . tamsulosin (FLOMAX) 0.4 MG CAPS capsule Take 0.4 mg by mouth daily after supper.   . temazepam (RESTORIL) 15 MG capsule Take 15 mg by mouth at bedtime.   . traMADol (ULTRAM) 50 MG tablet Take 50 mg by mouth every 6 (six) hours as needed.  . triamterene-hydrochlorothiazide (MAXZIDE-25) 37.5-25 MG tablet TAKE ONE TABLET BY MOUTH EVERY DAY     Allergies:   Patient has no known allergies.   Social History   Socioeconomic History  . Marital status: Married    Spouse name: Not on file  . Number of children: Not on file  . Years of education: Not on file  . Highest education level: Not on file  Occupational History  . Not on file  Tobacco Use  . Smoking status: Never Smoker  . Smokeless tobacco: Never Used  Vaping Use  . Vaping Use: Never  used  Substance and Sexual Activity  . Alcohol use: No  . Drug use: No  . Sexual activity: Not on file  Other Topics Concern  . Not on file  Social History Narrative  . Not on file   Social Determinants of Health   Financial Resource Strain:   . Difficulty of Paying Living Expenses:   Food Insecurity:   . Worried About Charity fundraiser in the Last Year:   . Arboriculturist in the Last Year:   Transportation Needs:   . Film/video editor (Medical):   Marland Kitchen Lack of Transportation (Non-Medical):   Physical Activity:   . Days of Exercise per Week:   . Minutes of Exercise per Session:   Stress:   . Feeling of Stress :   Social Connections:   . Frequency of Communication with Friends and Family:   . Frequency of Social Gatherings with Friends and Family:   .  Attends Religious Services:   . Active Member of Clubs or Organizations:   . Attends Archivist Meetings:   Marland Kitchen Marital Status:      Family History: The patient's family history includes CAD in his brother.  ROS:   Please see the history of present illness.    All other systems reviewed and are negative.  EKGs/Labs/Other Studies Reviewed:    The following studies were reviewed today: I discussed my findings with the patient at extensive length   Recent Labs: 07/13/2019: ALT 21; B Natriuretic Peptide 72.0 07/18/2019: Magnesium 1.8 07/19/2019: Hemoglobin 12.2; Platelets 116 08/16/2019: BUN 16; Creatinine, Ser 1.00; Potassium 3.5; Sodium 140  Recent Lipid Panel No results found for: CHOL, TRIG, HDL, CHOLHDL, VLDL, LDLCALC, LDLDIRECT  Physical Exam:    VS:  BP 112/70   Pulse 64   Ht 5\' 9"  (1.753 m)   Wt 176 lb (79.8 kg)   SpO2 96%   BMI 25.99 kg/m     Wt Readings from Last 3 Encounters:  03/11/20 176 lb (79.8 kg)  12/03/19 174 lb 2 oz (79 kg)  08/16/19 181 lb 1.9 oz (82.2 kg)     GEN: Patient is in no acute distress HEENT: Normal NECK: No JVD; No carotid bruits LYMPHATICS: No  lymphadenopathy CARDIAC: Hear sounds regular, 2/6 systolic murmur at the apex. RESPIRATORY:  Clear to auscultation without rales, wheezing or rhonchi  ABDOMEN: Soft, non-tender, non-distended MUSCULOSKELETAL:  No edema; No deformity  SKIN: Warm and dry NEUROLOGIC:  Alert and oriented x 3 PSYCHIATRIC:  Normal affect   Signed, Jenean Lindau, MD  03/11/2020 11:24 AM    [place]

## 2020-03-11 NOTE — Patient Instructions (Signed)

## 2020-03-20 DIAGNOSIS — M545 Low back pain: Secondary | ICD-10-CM | POA: Diagnosis not present

## 2020-04-09 ENCOUNTER — Other Ambulatory Visit: Payer: Self-pay | Admitting: Cardiology

## 2020-04-09 DIAGNOSIS — Z0000 Encounter for general adult medical examination without abnormal findings: Secondary | ICD-10-CM | POA: Diagnosis not present

## 2020-04-09 DIAGNOSIS — Z9181 History of falling: Secondary | ICD-10-CM | POA: Diagnosis not present

## 2020-04-09 DIAGNOSIS — Z1331 Encounter for screening for depression: Secondary | ICD-10-CM | POA: Diagnosis not present

## 2020-04-09 DIAGNOSIS — E785 Hyperlipidemia, unspecified: Secondary | ICD-10-CM | POA: Diagnosis not present

## 2020-04-10 DIAGNOSIS — E785 Hyperlipidemia, unspecified: Secondary | ICD-10-CM | POA: Diagnosis not present

## 2020-04-10 DIAGNOSIS — B356 Tinea cruris: Secondary | ICD-10-CM | POA: Diagnosis not present

## 2020-04-10 DIAGNOSIS — M159 Polyosteoarthritis, unspecified: Secondary | ICD-10-CM | POA: Diagnosis not present

## 2020-04-10 DIAGNOSIS — Z79899 Other long term (current) drug therapy: Secondary | ICD-10-CM | POA: Diagnosis not present

## 2020-04-10 DIAGNOSIS — I10 Essential (primary) hypertension: Secondary | ICD-10-CM | POA: Diagnosis not present

## 2020-04-10 DIAGNOSIS — I480 Paroxysmal atrial fibrillation: Secondary | ICD-10-CM | POA: Diagnosis not present

## 2020-04-10 DIAGNOSIS — R972 Elevated prostate specific antigen [PSA]: Secondary | ICD-10-CM | POA: Diagnosis not present

## 2020-04-10 DIAGNOSIS — F5104 Psychophysiologic insomnia: Secondary | ICD-10-CM | POA: Diagnosis not present

## 2020-05-22 ENCOUNTER — Other Ambulatory Visit: Payer: Self-pay | Admitting: Physician Assistant

## 2020-05-22 DIAGNOSIS — Z952 Presence of prosthetic heart valve: Secondary | ICD-10-CM

## 2020-06-09 ENCOUNTER — Telehealth: Payer: Self-pay | Admitting: Physician Assistant

## 2020-06-09 NOTE — Telephone Encounter (Signed)
Mrs. Randall Edwards requests to reschedule 1 year TAVR visits because they were with their son over the weekend who tested positive for covid and the flu. Mrs. Randall Edwards doesn't feel well and thinks she may be getting the flu too and is getting tested this afternoon.  Rescheduled echo and office visit to 10/27. She was grateful for assistance.

## 2020-06-09 NOTE — Telephone Encounter (Signed)
Patient and patient's wife only wants an appt with Bonney Leitz. Told the patient that she doesn't have anything. They refused to see anyone but her. Please call

## 2020-06-11 ENCOUNTER — Ambulatory Visit: Payer: PPO | Admitting: Physician Assistant

## 2020-06-11 ENCOUNTER — Other Ambulatory Visit (HOSPITAL_COMMUNITY): Payer: PPO

## 2020-06-14 ENCOUNTER — Other Ambulatory Visit: Payer: Self-pay | Admitting: Physician Assistant

## 2020-06-14 DIAGNOSIS — I10 Essential (primary) hypertension: Secondary | ICD-10-CM

## 2020-06-14 DIAGNOSIS — U071 COVID-19: Secondary | ICD-10-CM

## 2020-06-14 DIAGNOSIS — Z952 Presence of prosthetic heart valve: Secondary | ICD-10-CM

## 2020-06-14 NOTE — Progress Notes (Signed)
I connected by phone with Randall Edwards on 06/14/2020 at 8:38 AM to discuss the potential use of a new treatment for mild to moderate COVID-19 viral infection in non-hospitalized patients.  This patient is a 74 y.o. male that meets the FDA criteria for Emergency Use Authorization of COVID monoclonal antibody casirivimab/imdevimab.  Has a (+) direct SARS-CoV-2 viral test result  Has mild or moderate COVID-19   Is NOT hospitalized due to COVID-19  Is within 10 days of symptom onset  Has at least one of the high risk factor(s) for progression to severe COVID-19 and/or hospitalization as defined in EUA.  Specific high risk criteria : Older age (>/= 73 yo), BMI > 25 and Cardiovascular disease or hypertension   I have spoken and communicated the following to the patient or parent/caregiver regarding COVID monoclonal antibody treatment:  1. FDA has authorized the emergency use for the treatment of mild to moderate COVID-19 in adults and pediatric patients with positive results of direct SARS-CoV-2 viral testing who are 81 years of age and older weighing at least 40 kg, and who are at high risk for progressing to severe COVID-19 and/or hospitalization.  2. The significant known and potential risks and benefits of COVID monoclonal antibody, and the extent to which such potential risks and benefits are unknown.  3. Information on available alternative treatments and the risks and benefits of those alternatives, including clinical trials.  4. Patients treated with COVID monoclonal antibody should continue to self-isolate and use infection control measures (e.g., wear mask, isolate, social distance, avoid sharing personal items, clean and disinfect "high touch" surfaces, and frequent handwashing) according to CDC guidelines.   5. The patient or parent/caregiver has the option to accept or refuse COVID monoclonal antibody treatment.  After reviewing this information with the patient, the patient has  agreed to receive one of the available covid 19 monoclonal antibodies and will be provided an appropriate fact sheet prior to infusion.  Sx onset 10/1. Set up for infusion on 10/3 @ 8:30am. Directions given to [facility]. Pt is aware that insurance will be charged an infusion fee. Pt is fully vaccinated.   Angelena Form 06/14/2020 8:38 AM

## 2020-06-15 ENCOUNTER — Ambulatory Visit (HOSPITAL_COMMUNITY)
Admission: RE | Admit: 2020-06-15 | Discharge: 2020-06-15 | Disposition: A | Discharge status: Home / Self Care | Payer: Medicare Other | Source: Ambulatory Visit | Attending: Pulmonary Disease | Admitting: Pulmonary Disease

## 2020-06-15 DIAGNOSIS — U071 COVID-19: Secondary | ICD-10-CM | POA: Diagnosis present

## 2020-06-15 DIAGNOSIS — I10 Essential (primary) hypertension: Secondary | ICD-10-CM | POA: Insufficient documentation

## 2020-06-15 DIAGNOSIS — Z952 Presence of prosthetic heart valve: Secondary | ICD-10-CM | POA: Insufficient documentation

## 2020-06-15 DIAGNOSIS — Z23 Encounter for immunization: Secondary | ICD-10-CM | POA: Diagnosis not present

## 2020-06-15 MED ORDER — SODIUM CHLORIDE 0.9 % IV SOLN
1200.0000 mg | Freq: Once | INTRAVENOUS | Status: AC
  Administered 2020-06-15: 1200 mg via INTRAVENOUS

## 2020-06-15 MED ORDER — DIPHENHYDRAMINE HCL 50 MG/ML IJ SOLN: 50.0000 mg | Freq: Once | INTRAMUSCULAR | Status: DC | PRN

## 2020-06-15 MED ORDER — EPINEPHRINE 0.3 MG/0.3ML IJ SOAJ: 0.3000 mg | Freq: Once | INTRAMUSCULAR | Status: DC | PRN

## 2020-06-15 MED ORDER — FAMOTIDINE IN NACL 20-0.9 MG/50ML-% IV SOLN: 20.0000 mg | Freq: Once | INTRAVENOUS | Status: DC | PRN

## 2020-06-15 MED ORDER — METHYLPREDNISOLONE SODIUM SUCC 125 MG IJ SOLR: 125.0000 mg | Freq: Once | INTRAMUSCULAR | Status: DC | PRN

## 2020-06-15 MED ORDER — SODIUM CHLORIDE 0.9 % IV SOLN: INTRAVENOUS | Status: DC | PRN

## 2020-06-15 MED ORDER — ALBUTEROL SULFATE HFA 108 (90 BASE) MCG/ACT IN AERS: 2.0000 | INHALATION_SPRAY | Freq: Once | RESPIRATORY_TRACT | Status: DC | PRN

## 2020-06-15 NOTE — Discharge Instructions (Signed)
COVID-19 COVID-19 is a respiratory infection that is caused by a virus called severe acute respiratory syndrome coronavirus 2 (SARS-CoV-2). The disease is also known as coronavirus disease or novel coronavirus. In some people, the virus may not cause any symptoms. In others, it may cause a serious infection. The infection can get worse quickly and can lead to complications, such as:  Pneumonia, or infection of the lungs.  Acute respiratory distress syndrome or ARDS. This is a condition in which fluid build-up in the lungs prevents the lungs from filling with air and passing oxygen into the blood.  Acute respiratory failure. This is a condition in which there is not enough oxygen passing from the lungs to the body or when carbon dioxide is not passing from the lungs out of the body.  Sepsis or septic shock. This is a serious bodily reaction to an infection.  Blood clotting problems.  Secondary infections due to bacteria or fungus.  Organ failure. This is when your body's organs stop working. The virus that causes COVID-19 is contagious. This means that it can spread from person to person through droplets from coughs and sneezes (respiratory secretions). What are the causes? This illness is caused by a virus. You may catch the virus by:  Breathing in droplets from an infected person. Droplets can be spread by a person breathing, speaking, singing, coughing, or sneezing.  Touching something, like a table or a doorknob, that was exposed to the virus (contaminated) and then touching your mouth, nose, or eyes. What increases the risk? Risk for infection You are more likely to be infected with this virus if you:  Are within 6 feet (2 meters) of a person with COVID-19.  Provide care for or live with a person who is infected with COVID-19.  Spend time in crowded indoor spaces or live in shared housing. Risk for serious illness You are more likely to become seriously ill from the virus if  you:  Are 50 years of age or older. The higher your age, the more you are at risk for serious illness.  Live in a nursing home or long-term care facility.  Have cancer.  Have a long-term (chronic) disease such as: ? Chronic lung disease, including chronic obstructive pulmonary disease or asthma. ? A long-term disease that lowers your body's ability to fight infection (immunocompromised). ? Heart disease, including heart failure, a condition in which the arteries that lead to the heart become narrow or blocked (coronary artery disease), a disease which makes the heart muscle thick, weak, or stiff (cardiomyopathy). ? Diabetes. ? Chronic kidney disease. ? Sickle cell disease, a condition in which red blood cells have an abnormal "sickle" shape. ? Liver disease.  Are obese. What are the signs or symptoms? Symptoms of this condition can range from mild to severe. Symptoms may appear any time from 2 to 14 days after being exposed to the virus. They include:  A fever or chills.  A cough.  Difficulty breathing.  Headaches, body aches, or muscle aches.  Runny or stuffy (congested) nose.  A sore throat.  New loss of taste or smell. Some people may also have stomach problems, such as nausea, vomiting, or diarrhea. Other people may not have any symptoms of COVID-19. How is this diagnosed? This condition may be diagnosed based on:  Your signs and symptoms, especially if: ? You live in an area with a COVID-19 outbreak. ? You recently traveled to or from an area where the virus is common. ? You   provide care for or live with a person who was diagnosed with COVID-19. ? You were exposed to a person who was diagnosed with COVID-19.  A physical exam.  Lab tests, which may include: ? Taking a sample of fluid from the back of your nose and throat (nasopharyngeal fluid), your nose, or your throat using a swab. ? A sample of mucus from your lungs (sputum). ? Blood tests.  Imaging tests,  which may include, X-rays, CT scan, or ultrasound. How is this treated? At present, there is no medicine to treat COVID-19. Medicines that treat other diseases are being used on a trial basis to see if they are effective against COVID-19. Your health care provider will talk with you about ways to treat your symptoms. For most people, the infection is mild and can be managed at home with rest, fluids, and over-the-counter medicines. Treatment for a serious infection usually takes places in a hospital intensive care unit (ICU). It may include one or more of the following treatments. These treatments are given until your symptoms improve.  Receiving fluids and medicines through an IV.  Supplemental oxygen. Extra oxygen is given through a tube in the nose, a face mask, or a hood.  Positioning you to lie on your stomach (prone position). This makes it easier for oxygen to get into the lungs.  Continuous positive airway pressure (CPAP) or bi-level positive airway pressure (BPAP) machine. This treatment uses mild air pressure to keep the airways open. A tube that is connected to a motor delivers oxygen to the body.  Ventilator. This treatment moves air into and out of the lungs by using a tube that is placed in your windpipe.  Tracheostomy. This is a procedure to create a hole in the neck so that a breathing tube can be inserted.  Extracorporeal membrane oxygenation (ECMO). This procedure gives the lungs a chance to recover by taking over the functions of the heart and lungs. It supplies oxygen to the body and removes carbon dioxide. Follow these instructions at home: Lifestyle  If you are sick, stay home except to get medical care. Your health care provider will tell you how long to stay home. Call your health care provider before you go for medical care.  Rest at home as told by your health care provider.  Do not use any products that contain nicotine or tobacco, such as cigarettes,  e-cigarettes, and chewing tobacco. If you need help quitting, ask your health care provider.  Return to your normal activities as told by your health care provider. Ask your health care provider what activities are safe for you. General instructions  Take over-the-counter and prescription medicines only as told by your health care provider.  Drink enough fluid to keep your urine pale yellow.  Keep all follow-up visits as told by your health care provider. This is important. How is this prevented?  There is no vaccine to help prevent COVID-19 infection. However, there are steps you can take to protect yourself and others from this virus. To protect yourself:   Do not travel to areas where COVID-19 is a risk. The areas where COVID-19 is reported change often. To identify high-risk areas and travel restrictions, check the CDC travel website: wwwnc.cdc.gov/travel/notices  If you live in, or must travel to, an area where COVID-19 is a risk, take precautions to avoid infection. ? Stay away from people who are sick. ? Wash your hands often with soap and water for 20 seconds. If soap and water   are not available, use an alcohol-based hand sanitizer. ? Avoid touching your mouth, face, eyes, or nose. ? Avoid going out in public, follow guidance from your state and local health authorities. ? If you must go out in public, wear a cloth face covering or face mask. Make sure your mask covers your nose and mouth. ? Avoid crowded indoor spaces. Stay at least 6 feet (2 meters) away from others. ? Disinfect objects and surfaces that are frequently touched every day. This may include:  Counters and tables.  Doorknobs and light switches.  Sinks and faucets.  Electronics, such as phones, remote controls, keyboards, computers, and tablets. To protect others: If you have symptoms of COVID-19, take steps to prevent the virus from spreading to others.  If you think you have a COVID-19 infection, contact  your health care provider right away. Tell your health care team that you think you may have a COVID-19 infection.  Stay home. Leave your house only to seek medical care. Do not use public transport.  Do not travel while you are sick.  Wash your hands often with soap and water for 20 seconds. If soap and water are not available, use alcohol-based hand sanitizer.  Stay away from other members of your household. Let healthy household members care for children and pets, if possible. If you have to care for children or pets, wash your hands often and wear a mask. If possible, stay in your own room, separate from others. Use a different bathroom.  Make sure that all people in your household wash their hands well and often.  Cough or sneeze into a tissue or your sleeve or elbow. Do not cough or sneeze into your hand or into the air.  Wear a cloth face covering or face mask. Make sure your mask covers your nose and mouth. Where to find more information  Centers for Disease Control and Prevention: www.cdc.gov/coronavirus/2019-ncov/index.html  World Health Organization: www.who.int/health-topics/coronavirus Contact a health care provider if:  You live in or have traveled to an area where COVID-19 is a risk and you have symptoms of the infection.  You have had contact with someone who has COVID-19 and you have symptoms of the infection. Get help right away if:  You have trouble breathing.  You have pain or pressure in your chest.  You have confusion.  You have bluish lips and fingernails.  You have difficulty waking from sleep.  You have symptoms that get worse. These symptoms may represent a serious problem that is an emergency. Do not wait to see if the symptoms will go away. Get medical help right away. Call your local emergency services (911 in the [place] Do not drive yourself to the hospital. Let the emergency medical personnel know if you think you have  COVID-19. Summary  COVID-19 is a respiratory infection that is caused by a virus. It is also known as coronavirus disease or novel coronavirus. It can cause serious infections, such as pneumonia, acute respiratory distress syndrome, acute respiratory failure, or sepsis.  The virus that causes COVID-19 is contagious. This means that it can spread from person to person through droplets from breathing, speaking, singing, coughing, or sneezing.  You are more likely to develop a serious illness if you are 50 years of age or older, have a weak immune system, live in a nursing home, or have chronic disease.  There is no medicine to treat COVID-19. Your health care provider will talk with you about ways to treat your symptoms.    Take steps to protect yourself and others from infection. Wash your hands often and disinfect objects and surfaces that are frequently touched every day. Stay away from people who are sick and wear a mask if you are sick. This information is not intended to replace advice given to you by your health care provider. Make sure you discuss any questions you have with your health care provider. Document Revised: 06/29/2019 Document Reviewed: 10/05/2018 Elsevier Patient Education  2020 [employer] What types of side effects do monoclonal antibody drugs cause?  Common side effects  In general, the more common side effects caused by monoclonal antibody drugs include: . Allergic reactions, such as hives or itching . Flu-like signs and symptoms, including chills, fatigue, fever, and muscle aches and pains . Nausea, vomiting . Diarrhea . Skin rashes . Low blood pressure   The CDC is recommending patients who receive monoclonal antibody treatments wait at least 90 days before being vaccinated.  Currently, there are no data on the safety and efficacy of mRNA COVID-19 vaccines in persons who received monoclonal antibodies or convalescent plasma as part of COVID-19 treatment. Based  on the estimated half-life of such therapies as well as evidence suggesting that reinfection is uncommon in the 90 days after initial infection, vaccination should be deferred for at least 90 days, as a precautionary measure until additional information becomes available, to avoid interference of the antibody treatment with vaccine-induced immune responses. 

## 2020-06-15 NOTE — Progress Notes (Signed)
Patient ID: Randall Edwards, male   DOB: 05/28/1946, 74 y.o.   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  Complications: No immediate complications noted.  Discharge: Discharged home   [place] 06/15/2020

## 2020-07-09 ENCOUNTER — Ambulatory Visit: Payer: PPO | Admitting: Physician Assistant

## 2020-07-09 ENCOUNTER — Encounter: Payer: Self-pay | Admitting: Physician Assistant

## 2020-07-09 ENCOUNTER — Other Ambulatory Visit: Payer: Self-pay

## 2020-07-09 ENCOUNTER — Ambulatory Visit (HOSPITAL_COMMUNITY): Payer: PPO | Attending: Cardiovascular Disease

## 2020-07-09 VITALS — BP 126/74 | HR 70 | Ht 69.0 in | Wt 185.0 lb

## 2020-07-09 DIAGNOSIS — I4719 Other supraventricular tachycardia: Secondary | ICD-10-CM

## 2020-07-09 DIAGNOSIS — Z952 Presence of prosthetic heart valve: Secondary | ICD-10-CM | POA: Diagnosis not present

## 2020-07-09 DIAGNOSIS — G8929 Other chronic pain: Secondary | ICD-10-CM

## 2020-07-09 DIAGNOSIS — I471 Supraventricular tachycardia: Secondary | ICD-10-CM | POA: Diagnosis not present

## 2020-07-09 DIAGNOSIS — M549 Dorsalgia, unspecified: Secondary | ICD-10-CM

## 2020-07-09 DIAGNOSIS — I10 Essential (primary) hypertension: Secondary | ICD-10-CM | POA: Diagnosis not present

## 2020-07-09 DIAGNOSIS — E785 Hyperlipidemia, unspecified: Secondary | ICD-10-CM

## 2020-07-09 LAB — ECHOCARDIOGRAM COMPLETE
AR max vel: 1.14 cm2
AV Area VTI: 1.47 cm2
AV Area mean vel: 1.17 cm2
AV Mean grad: 16.5 mmHg
AV Peak grad: 35 mmHg
Ao pk vel: 2.96 m/s
Area-P 1/2: 2.49 cm2
S' Lateral: 2.4 cm

## 2020-07-09 NOTE — Progress Notes (Signed)
[facility] [place] [facility]                                       Cardiology Office Note    Date:  07/10/2020   ID:  Randall Edwards, DOB 22-Apr-1946, MRN [id number]  PCP:  Nicoletta Dress, MD  Cardiologist: Dr. Geraldo Pitter / Dr. Burt Knack & Dr. Cyndia Bent (TAVR)  CC: 1 year  s/p TAVR  History of Present Illness:  Randall Edwards is a 74 y.o. male with a history of HTN, HLD, s/p bioprosthetic Bentall procedurein 2010 byDr. Cyndia Bent, scar related focal atrial tachycardia (some places documented as afib) s/p ablation by Dr. Minna Merritts on Sotalol (not on OAC)and severe bioprosthetic AS s/p valve in valve TAVR (07/17/19) who present to clinic for follow up.   He had a 25 mm Edwards Magna-Easepericardial valve insidea Gelweave Valsalva graft. The valvemodel [id number]. He has done well since that surgery but presented last year with worsening exertional fatigue and repeat echo on 05/14/2011 showed EF 60-65% and severe AS with an increase in the mean gradient to 36.5 mmHg. L/RHC showed normal coronary arteries with normal right heart hemodynamics.   Patient was originally brought in on 07/03/2023 TAVR via the TF approach. However, we were unable to pass a guidewire across the valve and surgery was aborted. He underwent successful TAVR with a 26 mm Edwards Sapien 3 Ultra THV via the TA approach on 07/17/19. Post operative echo showed EF 55-60% with normally functioning TAVR with a mean gradient of 13.5 mmHg and no PVL. ECG with NSR with old first-degree AV block and new LBBB (122 ms.) He had AKI with creat up to 1.49. His BP was also elevated and he was started on Norvasc. He was continued on aspirin and started on plavix. Repeat BMET at follow up showed kidney function improvement to creat 1.09 and normal potassium on Kdur QOD.  Today he presents to clinic for follow up; here with his wife. Doing great. They both recently got sick after being exposed to their son who  tested positive for flu and Covid. Mr. Scogin got sick after his wife tested positive for Covid and was presumptively treated for covid with the monoclonal antibody infusion. He has totally recovered. Thankfully, he was vaccinated with the MRNA vaccines. He is otherwise doing quite well. Mostly limited by orthopedic issues. No CP or SOB. No LE edema, orthopnea or PND. No dizziness or syncope. No blood in stool or urine. No palpitations.    Past Medical History:  Diagnosis Date  . Ascending aorta dilatation ([place]) 05/16/2019  . Atrial tachycardia ([place])    s/p ablation and on sotolol  . BPH (benign prostatic hyperplasia)   . Chronic frontal sinusitis 09/12/2014  . Chronic sphenoidal sinusitis 09/12/2014  . Dyslipidemia 03/18/2015  . Essential hypertension 03/18/2015  . GERD (gastroesophageal reflux disease)   . H/O cardiac radiofrequency ablation 08/2016  . Hyperlipidemia   . Hypertension   . Hypertrophy of both inferior nasal turbinates 09/12/2014  . Multiple nasal polyps 09/12/2014  . Nasal septal deviation 09/12/2014  . S/P aorta repair   . S/P AVR (aortic valve replacement) 2010  . s/p valve-in-valve TAVR 07/17/2019   s/p VIV TAVR via the TA approach after failed TF case 07/03/19  . Severe aortic stenosis 05/16/2019  . Status post radiofrequency ablation for arrhythmia 03/18/2015  . Vertigo  Past Surgical History:  Procedure Laterality Date  . ABLATION OF DYSRHYTHMIC FOCUS    . CARDIAC VALVE REPLACEMENT    . CHOLECYSTECTOMY    . KIDNEY STONE SURGERY    . KNEE ARTHROSCOPY Bilateral   . MASS EXCISION Right 12/26/2017   Procedure: RIGHT INDEX EXCISION CYST AND DEBRIDEMENT OF DISTAL INTERPHALANGEAL JOINT;  Surgeon: Leanora Cover, MD;  Location: [facility];  Service: Orthopedics;  Laterality: Right;  Bier block  . RIGHT HEART CATH AND CORONARY ANGIOGRAPHY N/A 06/20/2019   Procedure: RIGHT HEART CATH AND CORONARY ANGIOGRAPHY;  Surgeon: Sherren Mocha, MD;  Location: [facility]  [facility];  Service: Cardiovascular;  Laterality: N/A;  . TEE WITHOUT CARDIOVERSION N/A 07/03/2019   Procedure: TRANSESOPHAGEAL ECHOCARDIOGRAM (TEE);  Surgeon: Sherren Mocha, MD;  Location: [facility];  Service: Open Heart Surgery;  Laterality: N/A;  . TEE WITHOUT CARDIOVERSION N/A 07/17/2019   Procedure: TRANSESOPHAGEAL ECHOCARDIOGRAM (TEE);  Surgeon: Sherren Mocha, MD;  Location: [facility];  Service: Open Heart Surgery;  Laterality: N/A;  . TRANSCATHETER AORTIC VALVE REPLACEMENT, TRANSAPICAL N/A 07/17/2019   Procedure: TRANSCATHETER AORTIC VALVE REPLACEMENT, TRANSAPICAL;  Surgeon: Sherren Mocha, MD;  Location: [facility];  Service: Open Heart Surgery;  Laterality: N/A;  . TRANSCATHETER AORTIC VALVE REPLACEMENT, TRANSFEMORAL N/A 07/03/2019   Procedure: attempted TRANSCATHETER AORTIC VALVE REPLACEMENT, TRANSFEMORAL;  Surgeon: Sherren Mocha, MD;  Location: [facility];  Service: Open Heart Surgery;  Laterality: N/A;    Current Medications: Outpatient Medications Prior to Visit  Medication Sig Dispense Refill  . amLODipine (NORVASC) 5 MG tablet Take 1 tablet (5 mg total) by mouth daily. 30 tablet 11  . amoxicillin (AMOXIL) 500 MG tablet Take 4 capsules (2,000 mg) one hour prior to all dental visits. 8 tablet 8  . aspirin EC 81 MG tablet Take 81 mg by mouth daily.     . fexofenadine (ALLEGRA) 180 MG tablet Take 180 mg by mouth 2 (two) times daily.     Marland Kitchen ibuprofen (ADVIL) 800 MG tablet Take 1,600 mg by mouth as directed. BACK PAIN    . ondansetron (ZOFRAN) 4 MG tablet Take 4 mg by mouth every 8 (eight) hours as needed (INNER EAR).    Marland Kitchen potassium chloride (KLOR-CON) 10 MEQ tablet Take 10 mEq by mouth daily.    . rosuvastatin (CRESTOR) 10 MG tablet TAKE ONE TABLET BY MOUTH EVERY DAY IN THE EVENING 90 tablet 3  . sotalol (BETAPACE) 80 MG tablet Take 1 tablet (80 mg total) by mouth daily. 90 tablet 2  . tamsulosin (FLOMAX) 0.4 MG CAPS capsule Take 0.4 mg by mouth daily after supper.     . temazepam (RESTORIL) 15 MG  capsule Take 15 mg by mouth at bedtime.     . traMADol (ULTRAM) 50 MG tablet Take 50 mg by mouth every 6 (six) hours as needed.    . triamterene-hydrochlorothiazide (MAXZIDE-25) 37.5-25 MG tablet TAKE ONE TABLET BY MOUTH EVERY DAY 90 tablet 3  . azelastine (ASTELIN) 0.1 % nasal spray Place 2 sprays into both nostrils 2 (two) times daily. (Patient not taking: Reported on 07/09/2020)    . clindamycin (CLEOCIN) 300 MG capsule Take 300 mg by mouth 3 (three) times daily. (Patient not taking: Reported on 07/09/2020)    . pantoprazole (PROTONIX) 40 MG tablet Take 1 tablet (40 mg total) by mouth daily. (Patient not taking: Reported on 07/09/2020) 90 tablet 3   No facility-administered medications prior to visit.     Allergies:   Patient has no known allergies.  Social History   Socioeconomic History  . Marital status: Married    Spouse name: Not on file  . Number of children: Not on file  . Years of education: Not on file  . Highest education level: Not on file  Occupational History  . Not on file  Tobacco Use  . Smoking status: Never Smoker  . Smokeless tobacco: Never Used  Vaping Use  . Vaping Use: Never used  Substance and Sexual Activity  . Alcohol use: No  . Drug use: No  . Sexual activity: Not on file  Other Topics Concern  . Not on file  Social History Narrative  . Not on file   Social Determinants of Health   Financial Resource Strain:   . Difficulty of Paying Living Expenses: Not on file  Food Insecurity:   . Worried About Charity fundraiser in the Last Year: Not on file  . Ran Out of Food in the Last Year: Not on file  Transportation Needs:   . Lack of Transportation (Medical): Not on file  . Lack of Transportation (Non-Medical): Not on file  Physical Activity:   . Days of Exercise per Week: Not on file  . Minutes of Exercise per Session: Not on file  Stress:   . Feeling of Stress : Not on file  Social Connections:   . Frequency of Communication with Friends  and Family: Not on file  . Frequency of Social Gatherings with Friends and Family: Not on file  . Attends Religious Services: Not on file  . Active Member of Clubs or Organizations: Not on file  . Attends Archivist Meetings: Not on file  . Marital Status: Not on file     Family History:  The patient's family history includes CAD in his brother.     ROS:   Please see the history of present illness.    ROS All other systems reviewed and are negative.   PHYSICAL EXAM:   VS:  BP 126/74   Pulse 70   Ht 5\' 9"  (1.753 m)   Wt 185 lb (83.9 kg)   SpO2 96%   BMI 27.32 kg/m    GEN: Well nourished, well developed, in no acute distress HEENT: normal Neck: no JVD or masses Cardiac: RRR; soft flow murmur. No rubs, or gallops,no edema  Respiratory:  clear to auscultation bilaterally, normal work of breathing GI: soft, nontender, nondistended, + BS MS: no deformity or atrophy Skin: warm and dry, no rash.  Neuro:  Alert and Oriented x 3, Strength and sensation are intact Psych: euthymic mood, full affect   Wt Readings from Last 3 Encounters:  07/09/20 185 lb (83.9 kg)  03/11/20 176 lb (79.8 kg)  12/03/19 174 lb 2 oz (79 kg)      Studies/Labs Reviewed:   EKG:  EKG is NOT ordered today.    Recent Labs: 07/13/2019: ALT 21; B Natriuretic Peptide 72.0 07/18/2019: Magnesium 1.8 07/09/2020: BUN 13; Creatinine, Ser 0.97; Hemoglobin 15.1; Platelets 189; Potassium 4.0; Sodium 143   Lipid Panel No results found for: CHOL, TRIG, HDL, CHOLHDL, VLDL, LDLCALC, LDLDIRECT  Additional studies/ records that were reviewed today include:  CARDIOTHORACIC SURGERY OPERATIVE NOTE  Date of Procedure:07/17/2019  Preoperative Diagnosis:Severe Aortic Stenosis   Postoperative Diagnosis:Same   Procedure:   Transcatheter Aortic Valve Replacement( Valve in Valve)- Transapical Approach Edwards Sapien 3(size 67mm, model # [id number], serial  [id number])  Co-Surgeons:Bryan Alveria Apley, MD and Sherren Mocha, MD   Anesthesiologist:R. Ola Spurr, MD  Anne Hahn, MD  Pre-operative Echo Findings: ? Severebioprostheticaortic stenosis ? Normalleft ventricular systolic function   Post-operative Echo Findings: ? Noparavalvular leak ? Normalleft ventricular systolic function _____________    Echo 07/18/19 IMPRESSIONS 1. Left ventricular ejection fraction, by visual estimation, is 55 to 60%. The left ventricle has normal function. There is no left ventricular hypertrophy. 2. Elevated left ventricular end-diastolic pressure. 3. Left ventricular diastolic parameters are consistent with Grade II diastolic dysfunction (pseudonormalization). 4. Global right ventricle has normal systolic function.The right ventricular size is normal. No increase in right ventricular wall thickness. 5. Left atrial size was mildly dilated. 6. Right atrial size was normal. 7. The mitral valve is normal in structure. Mild mitral valve regurgitation. No evidence of mitral stenosis. 8. The tricuspid valve is normal in structure. Tricuspid valve regurgitation is not demonstrated. 9. 64mm Edwards Sapien bioprosthetic, stented aortic valve (TAVR) valve is present in the aortic position. Aortic valve mean gradient measures 13.5 mmHg. Aortic valve peak gradient measures 24.9 mmHg. Aortic valve area, by VTI measures 1.46 cm.  Dimensionless index is 0.42. No perivalvular AI is present. 10. IVC normal in size with greater than 50% respiratory variability, suggesting right atrial pressure of 3 mmHg  ____________    Echo 08/16/19 IMPRESSIONS  1. Left ventricular ejection fraction, by visual estimation, is 65 to 70%. The left ventricle has hyperdynamic function. There is severely increased left ventricular hypertrophy.  2. Elevated left ventricular end-diastolic  pressure.  3. Left ventricular diastolic parameters are consistent with Grade I diastolic dysfunction (impaired relaxation).  4. Global right ventricle has normal systolic function.The right ventricular size is normal. No increase in right ventricular wall thickness.  5. Left atrial size was moderately dilated.  6. Right atrial size was normal.  7. The mitral valve is normal in structure. No evidence of mitral valve regurgitation. No evidence of mitral stenosis.  8. The tricuspid valve is normal in structure. Tricuspid valve regurgitation is mild.  9. Aortic valve regurgitation is not visualized. No evidence of aortic valve sclerosis or stenosis. 10. The pulmonic valve was normal in structure. Pulmonic valve regurgitation is not visualized. 11. The inferior vena cava is normal in size with greater than 50% respiratory variability, suggesting right atrial pressure of 3 mmHg. 12. Stable transaortic gradients across 23 mmEdwards SAPIEN 3 Ultra TAVR valve with peak/mean gradients 31/15 mmHg and no paravalvular leak. AVA 1.8 cm2.  ___________________  Echo 07/09/20 IMPRESSIONS  1. Left ventricular ejection fraction, by estimation, is 60 to 65%. The  left ventricle has normal function. The left ventricle has no regional  wall motion abnormalities. There is severe left ventricular hypertrophy.  Left ventricular diastolic parameters  are consistent with Grade II diastolic dysfunction (pseudonormalization).  2. Right ventricular systolic function is normal. The right ventricular  size is normal.  3. Left atrial size was mildly dilated.  4. The mitral valve is normal in structure. Trivial mitral valve  regurgitation. No evidence of mitral stenosis.  5. Prior AVR with 25 mm Edwards bioprosthetic valve with valve in valve  TAVR using 23 mm Medtronic Evolut valve. No PVL. Gradients only slightly  increased from echo done 08/16/19 mean 14 mm->17 mmHg peak 28-> 36 mmHg  Current DVI 0.42 and AVA  1.5 cm2. The  aortic valve has been repaired/replaced. Aortic valve regurgitation is  not visualized. No aortic stenosis is present.  6. Aortic dilatation noted. There is mild dilatation of the ascending  aorta, measuring 40 mm.  7. The inferior vena cava  is normal in size with greater than 50%  respiratory variability, suggesting right atrial pressure of 3 mmHg.    ASSESSMENT & PLAN:   Severe AS s/p valve in valve TAVR: echo today shows EF 60%, normally functioning TAVR with a mean gradient of 17 mm Hg and no PVL. He has NYHA class I symptoms but mostly limited by back pain. SBE prophylaxis discussed; he has amovicillin. He will continue on aspirin 81 mg daily alone. He will continue regular follow up with Dr. Geraldo Pitter.   HTN: BP well controlled today. No changes made  Atiral tach s/p ablation: continue on sotalol  HLD: continue statin   Back pain: taking a lot of ibuprofen. Worried about his kidneys and GI bleeding. Will check CBC and BMET  Medication Adjustments/Labs and Tests Ordered: Current medicines are reviewed at length with the patient today.  Concerns regarding medicines are outlined above.  Medication changes, Labs and Tests ordered today are listed in the Patient Instructions below. Patient Instructions  Medication Instructions:  No changes *If you need a refill on your cardiac medications before your next appointment, please call your pharmacy*   Lab Work: Today: bmet, cbc If you have labs (blood work) drawn today and your tests are completely normal, you will receive your results only by: Marland Kitchen MyChart Message (if you have MyChart) OR . A paper copy in the mail If you have any lab test that is abnormal or we need to change your treatment, we will call you to review the results.   Testing/Procedures: none   Follow-Up: As planned with Dr. Geraldo Pitter.  See below.  Other Instructions      Signed, Angelena Form, PA-C  07/10/2020 11:09 AM    [facility] [address], [place], [place]  [address] Phone: [phone]; Fax: [phone]

## 2020-07-09 NOTE — Patient Instructions (Signed)
Medication Instructions:  No changes *If you need a refill on your cardiac medications before your next appointment, please call your pharmacy*   Lab Work: Today: bmet, cbc If you have labs (blood work) drawn today and your tests are completely normal, you will receive your results only by: Marland Kitchen MyChart Message (if you have MyChart) OR . A paper copy in the mail If you have any lab test that is abnormal or we need to change your treatment, we will call you to review the results.   Testing/Procedures: none   Follow-Up: As planned with Dr. Geraldo Pitter.  See below.  Other Instructions

## 2020-07-10 LAB — CBC
Hematocrit: 43 % (ref 37.5–51.0)
Hemoglobin: 15.1 g/dL (ref 13.0–17.7)
MCH: 32.6 pg (ref 26.6–33.0)
MCHC: 35.1 g/dL (ref 31.5–35.7)
MCV: 93 fL (ref 79–97)
Platelets: 189 10*3/uL (ref 150–450)
RBC: 4.63 x10E6/uL (ref 4.14–5.80)
RDW: 12.7 % (ref 11.6–15.4)
WBC: 7.5 10*3/uL (ref 3.4–10.8)

## 2020-07-10 LAB — BASIC METABOLIC PANEL
BUN/Creatinine Ratio: 13 (ref 10–24)
BUN: 13 mg/dL (ref 8–27)
CO2: 24 mmol/L (ref 20–29)
Calcium: 9.8 mg/dL (ref 8.6–10.2)
Chloride: 104 mmol/L (ref 96–106)
Creatinine, Ser: 0.97 mg/dL (ref 0.76–1.27)
GFR calc Af Amer: 89 mL/min/{1.73_m2} (ref >59)
GFR calc non Af Amer: 77 mL/min/{1.73_m2} (ref >59)
Glucose: 112 mg/dL — ABNORMAL HIGH (ref 65–99)
Potassium: 4 mmol/L (ref 3.5–5.2)
Sodium: 143 mmol/L (ref 134–144)

## 2020-07-23 DIAGNOSIS — R3912 Poor urinary stream: Secondary | ICD-10-CM | POA: Diagnosis not present

## 2020-07-23 DIAGNOSIS — R972 Elevated prostate specific antigen [PSA]: Secondary | ICD-10-CM | POA: Diagnosis not present

## 2020-07-23 DIAGNOSIS — K05219 Aggressive periodontitis, localized, unspecified severity: Secondary | ICD-10-CM | POA: Diagnosis not present

## 2020-07-23 DIAGNOSIS — N401 Enlarged prostate with lower urinary tract symptoms: Secondary | ICD-10-CM | POA: Diagnosis not present

## 2020-07-25 ENCOUNTER — Other Ambulatory Visit: Payer: Self-pay | Admitting: Urology

## 2020-07-25 DIAGNOSIS — R972 Elevated prostate specific antigen [PSA]: Secondary | ICD-10-CM

## 2020-08-05 DIAGNOSIS — L820 Inflamed seborrheic keratosis: Secondary | ICD-10-CM | POA: Diagnosis not present

## 2020-08-05 DIAGNOSIS — D225 Melanocytic nevi of trunk: Secondary | ICD-10-CM | POA: Diagnosis not present

## 2020-08-05 DIAGNOSIS — L728 Other follicular cysts of the skin and subcutaneous tissue: Secondary | ICD-10-CM | POA: Diagnosis not present

## 2020-08-05 DIAGNOSIS — D485 Neoplasm of uncertain behavior of skin: Secondary | ICD-10-CM | POA: Diagnosis not present

## 2020-08-13 DIAGNOSIS — K219 Gastro-esophageal reflux disease without esophagitis: Secondary | ICD-10-CM | POA: Insufficient documentation

## 2020-08-13 DIAGNOSIS — R42 Dizziness and giddiness: Secondary | ICD-10-CM | POA: Insufficient documentation

## 2020-08-13 DIAGNOSIS — I10 Essential (primary) hypertension: Secondary | ICD-10-CM | POA: Insufficient documentation

## 2020-08-13 DIAGNOSIS — N400 Enlarged prostate without lower urinary tract symptoms: Secondary | ICD-10-CM | POA: Insufficient documentation

## 2020-08-13 DIAGNOSIS — E785 Hyperlipidemia, unspecified: Secondary | ICD-10-CM | POA: Insufficient documentation

## 2020-08-14 ENCOUNTER — Other Ambulatory Visit: Payer: Self-pay

## 2020-08-14 ENCOUNTER — Encounter: Payer: Self-pay | Admitting: Cardiology

## 2020-08-14 ENCOUNTER — Telehealth: Payer: Self-pay

## 2020-08-14 ENCOUNTER — Ambulatory Visit: Payer: PPO | Admitting: Cardiology

## 2020-08-14 VITALS — BP 141/85 | HR 65 | Ht 69.0 in | Wt 183.2 lb

## 2020-08-14 DIAGNOSIS — I10 Essential (primary) hypertension: Secondary | ICD-10-CM

## 2020-08-14 DIAGNOSIS — Z952 Presence of prosthetic heart valve: Secondary | ICD-10-CM | POA: Diagnosis not present

## 2020-08-14 DIAGNOSIS — E785 Hyperlipidemia, unspecified: Secondary | ICD-10-CM | POA: Diagnosis not present

## 2020-08-14 DIAGNOSIS — Z9889 Other specified postprocedural states: Secondary | ICD-10-CM

## 2020-08-14 NOTE — Telephone Encounter (Signed)
Patient given 2 bottles of Aspirin 81 mg. Lot number: [id number] Expiration date:03-23

## 2020-08-14 NOTE — Progress Notes (Signed)
Cardiology Office Note:    Date:  08/14/2020   ID:  Randall Edwards, DOB Aug 27, 1946, MRN [id number]  PCP:  Randall Dress, MD  Cardiologist:  Randall Lindau, MD   Referring MD: Randall Dress, MD    ASSESSMENT:    1. Essential hypertension   2. H/O cardiac radiofrequency ablation   3. s/p valve-in-valve TAVR   4. S/P aorta repair   5. S/P AVR (aortic valve replacement)   6. Dyslipidemia    PLAN:    In order of problems listed above:  1. Primary prevention stressed with the patient.  Importance of compliance with diet medication stressed andHe vocalized understanding.  2.  Post TAVR for aortic stenosis: Stable at this time. 3. Mixed dyslipidemia: Diet was emphasized recent lipid blood work was reviewed this is followed by his primary care physician. 4. Essential hypertension: Blood pressure stable.  Patient is on multiple medications and will have a Chem-7 today. 5. Patient will be seen in follow-up appointment in 4 months or earlier if the patient has any concerns    Medication Adjustments/Labs and Tests Ordered: Current medicines are reviewed at length with the patient today.  Concerns regarding medicines are outlined above.  No orders of the defined types were placed in this encounter.  No orders of the defined types were placed in this encounter.    No chief complaint on file.    History of Present Illness:    Randall Edwards is a 74 y.o. male.  Patient has past medical history of aortic valve replacement with TAVR procedure.  He also has history of essential hypertension dyslipidemia and denies any problems at this time.  He takes care of activities of daily living.  No chest pain orthopnea or PND.  He has limited ambulation because of orthopedic issues.  His blood pressure is fine at home according to the patient.  At the time of my evaluation, the patient is alert awake oriented and in no distress.  He is accompanied by his wife at this visit.  Past  Medical History:  Diagnosis Date  . Ascending aorta dilatation ([place]) 05/16/2019  . Atrial tachycardia ([place])    s/p ablation and on sotolol  . BPH (benign prostatic hyperplasia)   . Chronic frontal sinusitis 09/12/2014  . Chronic sphenoidal sinusitis 09/12/2014  . Dyslipidemia 03/18/2015  . Essential hypertension 03/18/2015  . GERD (gastroesophageal reflux disease)   . H/O cardiac radiofrequency ablation 08/2016  . Hyperlipidemia   . Hypertension   . Hypertrophy of both inferior nasal turbinates 09/12/2014  . Multiple nasal polyps 09/12/2014  . Nasal septal deviation 09/12/2014  . S/P aorta repair   . S/P AVR (aortic valve replacement) 2010  . s/p valve-in-valve TAVR 07/17/2019   s/p VIV TAVR via the TA approach after failed TF case 07/03/19  . Severe aortic stenosis 05/16/2019  . Status post radiofrequency ablation for arrhythmia 03/18/2015  . Vertigo     Past Surgical History:  Procedure Laterality Date  . ABLATION OF DYSRHYTHMIC FOCUS    . CARDIAC VALVE REPLACEMENT    . CHOLECYSTECTOMY    . KIDNEY STONE SURGERY    . KNEE ARTHROSCOPY Bilateral   . MASS EXCISION Right 12/26/2017   Procedure: RIGHT INDEX EXCISION CYST AND DEBRIDEMENT OF DISTAL INTERPHALANGEAL JOINT;  Surgeon: Leanora Cover, MD;  Location: [place];  Service: Orthopedics;  Laterality: Right;  Bier block  . RIGHT HEART CATH AND CORONARY ANGIOGRAPHY N/A 06/20/2019   Procedure: RIGHT  HEART CATH AND CORONARY ANGIOGRAPHY;  Surgeon: Sherren Mocha, MD;  Location: [place] [facility];  Service: Cardiovascular;  Laterality: N/A;  . TEE WITHOUT CARDIOVERSION N/A 07/03/2019   Procedure: TRANSESOPHAGEAL ECHOCARDIOGRAM (TEE);  Surgeon: Sherren Mocha, MD;  Location: [name];  Service: Open Heart Surgery;  Laterality: N/A;  . TEE WITHOUT CARDIOVERSION N/A 07/17/2019   Procedure: TRANSESOPHAGEAL ECHOCARDIOGRAM (TEE);  Surgeon: Sherren Mocha, MD;  Location: [place];  Service: Open Heart Surgery;  Laterality: N/A;  .  TRANSCATHETER AORTIC VALVE REPLACEMENT, TRANSAPICAL N/A 07/17/2019   Procedure: TRANSCATHETER AORTIC VALVE REPLACEMENT, TRANSAPICAL;  Surgeon: Sherren Mocha, MD;  Location: [facility];  Service: Open Heart Surgery;  Laterality: N/A;  . TRANSCATHETER AORTIC VALVE REPLACEMENT, TRANSFEMORAL N/A 07/03/2019   Procedure: attempted TRANSCATHETER AORTIC VALVE REPLACEMENT, TRANSFEMORAL;  Surgeon: Sherren Mocha, MD;  Location: [facility];  Service: Open Heart Surgery;  Laterality: N/A;    Current Medications: Current Meds  Medication Sig  . aspirin EC 81 MG tablet Take 81 mg by mouth daily.   . fexofenadine (ALLEGRA) 180 MG tablet Take 180 mg by mouth 2 (two) times daily.   Marland Kitchen ibuprofen (ADVIL) 800 MG tablet Take 1,600 mg by mouth as directed. BACK PAIN  . ondansetron (ZOFRAN) 4 MG tablet Take 4 mg by mouth every 8 (eight) hours as needed (INNER EAR).  Marland Kitchen potassium chloride (KLOR-CON) 10 MEQ tablet Take 10 mEq by mouth daily.  . rosuvastatin (CRESTOR) 10 MG tablet TAKE ONE TABLET BY MOUTH EVERY DAY IN THE EVENING  . sotalol (BETAPACE) 80 MG tablet Take 1 tablet (80 mg total) by mouth daily.  . temazepam (RESTORIL) 15 MG capsule Take 15 mg by mouth at bedtime.   . traMADol (ULTRAM) 50 MG tablet Take 50 mg by mouth every 6 (six) hours as needed.  . triamterene-hydrochlorothiazide (MAXZIDE-25) 37.5-25 MG tablet TAKE ONE TABLET BY MOUTH EVERY DAY     Allergies:   Patient has no known allergies.   Social History   Socioeconomic History  . Marital status: Married    Spouse name: Not on file  . Number of children: Not on file  . Years of education: Not on file  . Highest education level: Not on file  Occupational History  . Not on file  Tobacco Use  . Smoking status: Never Smoker  . Smokeless tobacco: Never Used  Vaping Use  . Vaping Use: Never used  Substance and Sexual Activity  . Alcohol use: No  . Drug use: No  . Sexual activity: Not on file  Other Topics Concern  . Not on file  Social History  Narrative  . Not on file   Social Determinants of Health   Financial Resource Strain:   . Difficulty of Paying Living Expenses: Not on file  Food Insecurity:   . Worried About Charity fundraiser in the Last Year: Not on file  . Ran Out of Food in the Last Year: Not on file  Transportation Needs:   . Lack of Transportation (Medical): Not on file  . Lack of Transportation (Non-Medical): Not on file  Physical Activity:   . Days of Exercise per Week: Not on file  . Minutes of Exercise per Session: Not on file  Stress:   . Feeling of Stress : Not on file  Social Connections:   . Frequency of Communication with Friends and Family: Not on file  . Frequency of Social Gatherings with Friends and Family: Not on file  . Attends Religious Services: Not  on file  . Active Member of Clubs or Organizations: Not on file  . Attends Archivist Meetings: Not on file  . Marital Status: Not on file     Family History: The patient's family history includes CAD in his brother.  ROS:   Please see the history of present illness.    All other systems reviewed and are negative.  EKGs/Labs/Other Studies Reviewed:    The following studies were reviewed today: EKG reveals sinus rhythm and nonspecific ST-T changes   Recent Labs: 07/09/2020: BUN 13; Creatinine, Ser 0.97; Hemoglobin 15.1; Platelets 189; Potassium 4.0; Sodium 143  Recent Lipid Panel No results found for: CHOL, TRIG, HDL, CHOLHDL, VLDL, LDLCALC, LDLDIRECT  Physical Exam:    VS:  BP (!) 141/85   Pulse 65   Ht 5\' 9"  (1.753 m)   Wt 183 lb 3.2 oz (83.1 kg)   SpO2 97%   BMI 27.05 kg/m     Wt Readings from Last 3 Encounters:  08/14/20 183 lb 3.2 oz (83.1 kg)  07/09/20 185 lb (83.9 kg)  03/11/20 176 lb (79.8 kg)     GEN: Patient is in no acute distress HEENT: Normal NECK: No JVD; No carotid bruits LYMPHATICS: No lymphadenopathy CARDIAC: Hear sounds regular, 2/6 systolic murmur at the apex. RESPIRATORY:  Clear to  auscultation without rales, wheezing or rhonchi  ABDOMEN: Soft, non-tender, non-distended MUSCULOSKELETAL:  No edema; No deformity  SKIN: Warm and dry NEUROLOGIC:  Alert and oriented x 3 PSYCHIATRIC:  Normal affect   Signed, Randall Lindau, MD  08/14/2020 1:35 PM    [facility]

## 2020-08-14 NOTE — Patient Instructions (Signed)
Medication Instructions:  No medication changes. *If you need a refill on your cardiac medications before your next appointment, please call your pharmacy*   Lab Work: Your physician recommends that you have a BMET today in the office.  If you have labs (blood work) drawn today and your tests are completely normal, you will receive your results only by: Marland Kitchen MyChart Message (if you have MyChart) OR . A paper copy in the mail If you have any lab test that is abnormal or we need to change your treatment, we will call you to review the results.   Testing/Procedures: None ordered   Follow-Up: At [facility], you and your health needs are our priority.  As part of our continuing mission to provide you with exceptional heart care, we have created designated Provider Care Teams.  These Care Teams include your primary Cardiologist (physician) and Advanced Practice Providers (APPs -  Physician Assistants and Nurse Practitioners) who all work together to provide you with the care you need, when you need it.  We recommend signing up for the patient portal called "MyChart".  Sign up information is provided on this After Visit Summary.  MyChart is used to connect with patients for Virtual Visits (Telemedicine).  Patients are able to view lab/test results, encounter notes, upcoming appointments, etc.  Non-urgent messages can be sent to your provider as well.   To learn more about what you can do with MyChart, go to [email or web address].    Your next appointment:   4 month(s)  The format for your next appointment:   In Person  Provider:   Jyl Heinz, MD   Other Instructions NA

## 2020-08-15 LAB — BASIC METABOLIC PANEL
BUN/Creatinine Ratio: 14 (ref 10–24)
BUN: 13 mg/dL (ref 8–27)
CO2: 24 mmol/L (ref 20–29)
Calcium: 9.5 mg/dL (ref 8.6–10.2)
Chloride: 102 mmol/L (ref 96–106)
Creatinine, Ser: 0.96 mg/dL (ref 0.76–1.27)
GFR calc Af Amer: 90 mL/min/{1.73_m2} (ref >59)
GFR calc non Af Amer: 78 mL/min/{1.73_m2} (ref >59)
Glucose: 77 mg/dL (ref 65–99)
Potassium: 4.4 mmol/L (ref 3.5–5.2)
Sodium: 144 mmol/L (ref 134–144)

## 2020-08-17 ENCOUNTER — Ambulatory Visit
Admission: RE | Admit: 2020-08-17 | Discharge: 2020-08-17 | Disposition: A | Discharge status: Home / Self Care | Payer: PPO | Source: Ambulatory Visit | Attending: Urology | Admitting: Urology

## 2020-08-17 DIAGNOSIS — R972 Elevated prostate specific antigen [PSA]: Secondary | ICD-10-CM

## 2020-08-17 DIAGNOSIS — N400 Enlarged prostate without lower urinary tract symptoms: Secondary | ICD-10-CM | POA: Diagnosis not present

## 2020-08-17 MED ORDER — GADOBENATE DIMEGLUMINE 529 MG/ML IV SOLN
15.0000 mL | Freq: Once | INTRAVENOUS | Status: AC | PRN
  Administered 2020-08-17: 15 mL via INTRAVENOUS

## 2020-08-29 DIAGNOSIS — F329 Major depressive disorder, single episode, unspecified: Secondary | ICD-10-CM | POA: Diagnosis not present

## 2020-09-01 ENCOUNTER — Other Ambulatory Visit: Payer: Self-pay | Admitting: Cardiology

## 2020-09-01 NOTE — Telephone Encounter (Signed)
Rx refill sent to pharmacy. 

## 2020-09-11 ENCOUNTER — Telehealth: Payer: Self-pay | Admitting: Cardiology

## 2020-09-11 NOTE — Telephone Encounter (Signed)
Pt advised of the drug interaction and potiental problem. Recommendation given as Margaretmary Dys, PharmD. Pt states that he will call Dr. Tomasa Blase. Pt verbalized understanding and had no additional questions.

## 2020-09-11 NOTE — Telephone Encounter (Signed)
Both sotalol and Lexapro can increase risk of QTc prolongation. Lexapro is more QTc prolonging than other SSRIs and SNRIs. Recommend that pt have PCP prescribe different medication like duloxetine which avoids QTc interaction.

## 2020-09-11 NOTE — Telephone Encounter (Signed)
Pt has been on Sotalol and on 08/29/20 he was prescribed Lexapro. The pharmacist mentioned he might have a problem with the two medications. Can you advise?

## 2020-09-11 NOTE — Telephone Encounter (Signed)
Patient is calling to talk with Dr. Tomie China or nurse regarding medication that he was prescribed. Please call back.

## 2020-09-11 NOTE — Telephone Encounter (Signed)
FYI

## 2020-09-23 DIAGNOSIS — M545 Low back pain, unspecified: Secondary | ICD-10-CM | POA: Diagnosis not present

## 2020-09-26 DIAGNOSIS — F329 Major depressive disorder, single episode, unspecified: Secondary | ICD-10-CM | POA: Diagnosis not present

## 2020-10-13 DIAGNOSIS — E785 Hyperlipidemia, unspecified: Secondary | ICD-10-CM | POA: Diagnosis not present

## 2020-10-13 DIAGNOSIS — I480 Paroxysmal atrial fibrillation: Secondary | ICD-10-CM | POA: Diagnosis not present

## 2020-10-13 DIAGNOSIS — F5104 Psychophysiologic insomnia: Secondary | ICD-10-CM | POA: Diagnosis not present

## 2020-10-13 DIAGNOSIS — J069 Acute upper respiratory infection, unspecified: Secondary | ICD-10-CM | POA: Diagnosis not present

## 2020-10-13 DIAGNOSIS — I10 Essential (primary) hypertension: Secondary | ICD-10-CM | POA: Diagnosis not present

## 2020-10-13 DIAGNOSIS — F329 Major depressive disorder, single episode, unspecified: Secondary | ICD-10-CM | POA: Diagnosis not present

## 2020-10-15 ENCOUNTER — Telehealth: Payer: Self-pay | Admitting: Cardiology

## 2020-10-15 DIAGNOSIS — I517 Cardiomegaly: Secondary | ICD-10-CM | POA: Diagnosis not present

## 2020-10-15 DIAGNOSIS — J9811 Atelectasis: Secondary | ICD-10-CM | POA: Diagnosis not present

## 2020-10-15 DIAGNOSIS — R21 Rash and other nonspecific skin eruption: Secondary | ICD-10-CM | POA: Diagnosis not present

## 2020-10-15 DIAGNOSIS — I4891 Unspecified atrial fibrillation: Secondary | ICD-10-CM | POA: Diagnosis not present

## 2020-10-15 DIAGNOSIS — R079 Chest pain, unspecified: Secondary | ICD-10-CM | POA: Diagnosis not present

## 2020-10-15 NOTE — Telephone Encounter (Signed)
I was unable to contact RN to transfer patient. Please return call when able.

## 2020-10-15 NOTE — Telephone Encounter (Signed)
.  Patient c/o Palpitations:  High priority if patient c/o lightheadedness, shortness of breath, or chest pain  1) How long have you had palpitations/irregular HR/ Afib? Are you having the symptoms now? Patient states he is currently in afib. He states it began this afternoon after attending a funeral.  2) Are you currently experiencing lightheadedness, SOB or CP? No  3) Do you have a history of afib (atrial fibrillation) or irregular heart rhythm? Yes   4) Have you checked your BP or HR? (document readings if available):  HR: 125 132 144   5) Are you experiencing any other symptoms? No  STAT if HR is under 50 or over 120 (normal HR is 60-100 beats per minute)  1) What is your heart rate? 125 (currently)  2) Do you have a log of your heart rate readings (document readings)? 125, 132, 144 (all recorded this afternoon, 10/15/20)  3) Do you have any other symptoms? No

## 2020-10-15 NOTE — Telephone Encounter (Signed)
Pt states that while attending a funeral today he had pain in both sides of his neck. Pt states after removing his mask the pain resolved. Pt states that once home he had "indigestion" that was relieved after drinking Dr. Malachi Bonds and belching. Pt states that he was checking his heart rate and it has been 120-140's. Pt denies chest pain or shortness of breath at this time. Pt states that he took 80 mg Sotolol ar 1523. Spoke with Dr. Geraldo Pitter and he advises to give the medication time to work and it the heart rate does not decrease to go to the ED. Spoke with pt again at 1555 and he reports his heart rate is now 100-110. Pt verbalized understanding and had no additional questions.

## 2020-10-16 ENCOUNTER — Ambulatory Visit: Payer: PPO | Admitting: Cardiology

## 2020-10-16 ENCOUNTER — Encounter: Payer: Self-pay | Admitting: Cardiology

## 2020-10-16 ENCOUNTER — Telehealth: Payer: Self-pay | Admitting: Cardiology

## 2020-10-16 ENCOUNTER — Other Ambulatory Visit: Payer: Self-pay

## 2020-10-16 VITALS — BP 122/72 | HR 108 | Ht 69.0 in | Wt 181.8 lb

## 2020-10-16 DIAGNOSIS — I10 Essential (primary) hypertension: Secondary | ICD-10-CM | POA: Diagnosis not present

## 2020-10-16 DIAGNOSIS — Z79899 Other long term (current) drug therapy: Secondary | ICD-10-CM | POA: Diagnosis not present

## 2020-10-16 DIAGNOSIS — Z9889 Other specified postprocedural states: Secondary | ICD-10-CM | POA: Diagnosis not present

## 2020-10-16 DIAGNOSIS — I480 Paroxysmal atrial fibrillation: Secondary | ICD-10-CM | POA: Diagnosis not present

## 2020-10-16 DIAGNOSIS — J321 Chronic frontal sinusitis: Secondary | ICD-10-CM

## 2020-10-16 DIAGNOSIS — E782 Mixed hyperlipidemia: Secondary | ICD-10-CM | POA: Diagnosis not present

## 2020-10-16 MED ORDER — SOTALOL HCL 80 MG PO TABS: 80.0000 mg | ORAL_TABLET | Freq: Two times a day (BID) | ORAL | 6 refills | Status: DC

## 2020-10-16 MED ORDER — RIVAROXABAN 20 MG PO TABS: 20.0000 mg | ORAL_TABLET | Freq: Every day | ORAL | 6 refills | Status: DC

## 2020-10-16 NOTE — Telephone Encounter (Signed)
Patient is calling to follow up. He states last night he went to the ED as directed. He was discharged shortly after and advised to follow up with Dr. Geraldo Pitter either today, 10/16/20 or 10/17/20. I made him aware that Dr. Geraldo Pitter currently does not have availability for today, tomorrow, or even next week. Are we able to work him in anywhere for ED follow up? Please advise.

## 2020-10-16 NOTE — Telephone Encounter (Signed)
Pt has an appointment with Dr. Geraldo Pitter today.

## 2020-10-16 NOTE — Progress Notes (Signed)
Cardiology Office Note:    Date:  10/16/2020   ID:  Randall Edwards, DOB Aug 23, 1946, MRN [id number]  PCP:  Nicoletta Dress, MD  Cardiologist:  Jenean Lindau, MD   Referring MD: Nicoletta Dress, MD    ASSESSMENT:    1. Essential hypertension   2. Mixed hyperlipidemia   3. Chronic frontal sinusitis   4. S/P aorta repair   5. H/O cardiac radiofrequency ablation    PLAN:    In order of problems listed above:  1. Newly diagnosed atrial fibrillation: EKG done today reveals atrial fibrillation with elevated ventricular rate.  I discussed the following with the patient at length.I discussed with the patient atrial fibrillation, disease process. Management and therapy including rate and rhythm control, anticoagulation benefits and potential risks were discussed extensively with the patient. Patient had multiple questions which were answered to patient's satisfaction.  I initiated him on Xarelto 20 mg daily.  I reviewed blood work another records from [facility] emergency room records extensively and questions were answered to satisfaction.  I doubled his sotalol to 80 mg twice daily.  I stopped his amlodipine because of borderline blood pressure.  He also will get I FOBT check for occult blood. 2. Essential hypertension: Blood pressure stable and diet was emphasized. 3. Aortic valve replacement with TAVR: Stable at this time and questions were answered to satisfaction.  Echocardiogram was reviewed. 4. He will be seen in follow-up appointment in 8 to 10 days or earlier if he has any concerns.   Medication Adjustments/Labs and Tests Ordered: Current medicines are reviewed at length with the patient today.  Concerns regarding medicines are outlined above.  No orders of the defined types were placed in this encounter.  No orders of the defined types were placed in this encounter.    No chief complaint on file.    History of Present Illness:    Randall Edwards is a 75 y.o.  male.  Patient has past medical history of atrial fibrillation post ablation and possibly for atrial tachycardia and not clear for this.  He has history of essential hypertension dyslipidemia ascending aortic repair for dilated aorta and aneurysm, aortic valve replacement by TAVR.  He comes in to be clinic today.  He has had history of palpitations.  He went to the emergency room and was found to be negative fibrillation with rapid ventricular rate.  I reviewed those records extensively.  At the time of my evaluation, the patient is alert awake oriented and in no distress.  Past Medical History:  Diagnosis Date  . Ascending aorta dilatation ([place]) 05/16/2019  . Atrial tachycardia ([place])    s/p ablation and on sotolol  . BPH (benign prostatic hyperplasia)   . Chronic frontal sinusitis 09/12/2014  . Chronic sphenoidal sinusitis 09/12/2014  . Dyslipidemia 03/18/2015  . Essential hypertension 03/18/2015  . GERD (gastroesophageal reflux disease)   . H/O cardiac radiofrequency ablation 08/2016  . Hyperlipidemia   . Hypertension   . Hypertrophy of both inferior nasal turbinates 09/12/2014  . Multiple nasal polyps 09/12/2014  . Nasal septal deviation 09/12/2014  . S/P aorta repair   . S/P AVR (aortic valve replacement) 2010  . s/p valve-in-valve TAVR 07/17/2019   s/p VIV TAVR via the TA approach after failed TF case 07/03/19  . Severe aortic stenosis 05/16/2019  . Status post radiofrequency ablation for arrhythmia 03/18/2015  . Vertigo     Past Surgical History:  Procedure Laterality Date  . ABLATION OF  DYSRHYTHMIC FOCUS    . CARDIAC VALVE REPLACEMENT    . CHOLECYSTECTOMY    . KIDNEY STONE SURGERY    . KNEE ARTHROSCOPY Bilateral   . MASS EXCISION Right 12/26/2017   Procedure: RIGHT INDEX EXCISION CYST AND DEBRIDEMENT OF DISTAL INTERPHALANGEAL JOINT;  Surgeon: Leanora Cover, MD;  Location: [place];  Service: Orthopedics;  Laterality: Right;  Bier block  . RIGHT HEART CATH AND CORONARY  ANGIOGRAPHY N/A 06/20/2019   Procedure: RIGHT HEART CATH AND CORONARY ANGIOGRAPHY;  Surgeon: Sherren Mocha, MD;  Location: [place] [facility];  Service: Cardiovascular;  Laterality: N/A;  . TEE WITHOUT CARDIOVERSION N/A 07/03/2019   Procedure: TRANSESOPHAGEAL ECHOCARDIOGRAM (TEE);  Surgeon: Sherren Mocha, MD;  Location: [place];  Service: Open Heart Surgery;  Laterality: N/A;  . TEE WITHOUT CARDIOVERSION N/A 07/17/2019   Procedure: TRANSESOPHAGEAL ECHOCARDIOGRAM (TEE);  Surgeon: Sherren Mocha, MD;  Location: [place];  Service: Open Heart Surgery;  Laterality: N/A;  . TRANSCATHETER AORTIC VALVE REPLACEMENT, TRANSAPICAL N/A 07/17/2019   Procedure: TRANSCATHETER AORTIC VALVE REPLACEMENT, TRANSAPICAL;  Surgeon: Sherren Mocha, MD;  Location: [place];  Service: Open Heart Surgery;  Laterality: N/A;  . TRANSCATHETER AORTIC VALVE REPLACEMENT, TRANSFEMORAL N/A 07/03/2019   Procedure: attempted TRANSCATHETER AORTIC VALVE REPLACEMENT, TRANSFEMORAL;  Surgeon: Sherren Mocha, MD;  Location: [place];  Service: Open Heart Surgery;  Laterality: N/A;    Current Medications: Current Meds  Medication Sig  . aspirin EC 81 MG tablet Take 81 mg by mouth daily.   . DULoxetine (CYMBALTA) 60 MG capsule Take 60 mg by mouth daily.  . fexofenadine (ALLEGRA) 180 MG tablet Take 180 mg by mouth 2 (two) times daily.   Marland Kitchen ibuprofen (ADVIL) 800 MG tablet Take 1,600 mg by mouth as directed. BACK PAIN  . ondansetron (ZOFRAN) 4 MG tablet Take 4 mg by mouth every 8 (eight) hours as needed (INNER EAR).  . pantoprazole (PROTONIX) 40 MG tablet Take 40 mg by mouth daily.  . potassium chloride (KLOR-CON) 10 MEQ tablet Take 10 mEq by mouth daily.  . rosuvastatin (CRESTOR) 10 MG tablet TAKE ONE TABLET BY MOUTH EVERY DAY IN THE EVENING  . sotalol (BETAPACE) 80 MG tablet Take 40 mg by mouth 2 (two) times daily.  . tamsulosin (FLOMAX) 0.4 MG CAPS capsule Take 0.4 mg by mouth daily after supper.  . temazepam (RESTORIL) 15 MG capsule Take 15  mg by mouth at bedtime.   . traMADol (ULTRAM) 50 MG tablet Take 50 mg by mouth every 6 (six) hours as needed (back pain).  . triamterene-hydrochlorothiazide (MAXZIDE-25) 37.5-25 MG tablet TAKE ONE TABLET BY MOUTH EVERY DAY     Allergies:   Patient has no known allergies.   Social History   Socioeconomic History  . Marital status: Married    Spouse name: Not on file  . Number of children: Not on file  . Years of education: Not on file  . Highest education level: Not on file  Occupational History  . Not on file  Tobacco Use  . Smoking status: Never Smoker  . Smokeless tobacco: Never Used  Vaping Use  . Vaping Use: Never used  Substance and Sexual Activity  . Alcohol use: No  . Drug use: No  . Sexual activity: Not on file  Other Topics Concern  . Not on file  Social History Narrative  . Not on file   Social Determinants of Health   Financial Resource Strain: Not on file  Food Insecurity: Not on file  Transportation Needs: Not on file  Physical Activity: Not on file  Stress: Not on file  Social Connections: Not on file     Family History: The patient's family history includes CAD in his brother.  ROS:   Please see the history of present illness.    All other systems reviewed and are negative.  EKGs/Labs/Other Studies Reviewed:    The following studies were reviewed today: IMPRESSIONS    1. Left ventricular ejection fraction, by estimation, is 60 to 65%. The  left ventricle has normal function. The left ventricle has no regional  wall motion abnormalities. There is severe left ventricular hypertrophy.  Left ventricular diastolic parameters  are consistent with Grade II diastolic dysfunction (pseudonormalization).  2. Right ventricular systolic function is normal. The right ventricular  size is normal.  3. Left atrial size was mildly dilated.  4. The mitral valve is normal in structure. Trivial mitral valve  regurgitation. No evidence of mitral stenosis.   5. Prior AVR with 25 mm Edwards bioprosthetic valve with valve in valve  TAVR using 23 mm Medtronic Evolut valve. No PVL. Gradients only slightly  increased from echo done 08/16/19 mean 14 mm->17 mmHg peak 28-> 36 mmHg  Current DVI 0.42 and AVA 1.5 cm2. The  aortic valve has been repaired/replaced. Aortic valve regurgitation is  not visualized. No aortic stenosis is present.  6. Aortic dilatation noted. There is mild dilatation of the ascending  aorta, measuring 40 mm.  7. The inferior vena cava is normal in size with greater than 50%  respiratory variability, suggesting right atrial pressure of 3 mmHg.   Recent Labs: 07/09/2020: Hemoglobin 15.1; Platelets 189 08/14/2020: BUN 13; Creatinine, Ser 0.96; Potassium 4.4; Sodium 144  Recent Lipid Panel No results found for: CHOL, TRIG, HDL, CHOLHDL, VLDL, LDLCALC, LDLDIRECT  Physical Exam:    VS:  BP 122/72   Pulse (!) 108   Ht 5\' 9"  (1.753 m)   Wt 181 lb 12.8 oz (82.5 kg)   SpO2 97%   BMI 26.85 kg/m     Wt Readings from Last 3 Encounters:  10/16/20 181 lb 12.8 oz (82.5 kg)  08/14/20 183 lb 3.2 oz (83.1 kg)  07/09/20 185 lb (83.9 kg)     GEN: Patient is in no acute distress HEENT: Normal NECK: No JVD; No carotid bruits LYMPHATICS: No lymphadenopathy CARDIAC: Hear sounds regular, 2/6 systolic murmur at the apex. RESPIRATORY:  Clear to auscultation without rales, wheezing or rhonchi  ABDOMEN: Soft, non-tender, non-distended MUSCULOSKELETAL:  No edema; No deformity  SKIN: Warm and dry NEUROLOGIC:  Alert and oriented x 3 PSYCHIATRIC:  Normal affect   Signed, Jenean Lindau, MD  10/16/2020 1:01 PM    [facility]

## 2020-10-16 NOTE — Telephone Encounter (Signed)
Pt c/o medication issue:  1. Name of Medication:  rivaroxaban (XARELTO) 20 MG TABS tablet  aspirin EC 81 MG tablet    2. How are you currently taking this medication (dosage and times per day)? Given xarelto today, aspirin 1 tablet daily  3. Are you having a reaction (difficulty breathing--STAT)? no  4. What is your medication issue? Patient would like to know if he is supposed to take both the xarelto and his aspirin.

## 2020-10-16 NOTE — Patient Instructions (Signed)
Medication Instructions:  Your physician has recommended you make the following change in your medication:   Increase your Sotalol HCL to 80 mg 1 tab twice daily. Start Xarelto 20 mg daily.  *If you need a refill on your cardiac medications before your next appointment, please call your pharmacy*   Lab Work: Your physician recommends that you have labs done in the office today. Your test includes a TSH.  If you have labs (blood work) drawn today and your tests are completely normal, you will receive your results only by: Marland Kitchen MyChart Message (if you have MyChart) OR . A paper copy in the mail If you have any lab test that is abnormal or we need to change your treatment, we will call you to review the results.   Testing/Procedures: None ordered   Follow-Up: At [facility], you and your health needs are our priority.  As part of our continuing mission to provide you with exceptional heart care, we have created designated Provider Care Teams.  These Care Teams include your primary Cardiologist (physician) and Advanced Practice Providers (APPs -  Physician Assistants and Nurse Practitioners) who all work together to provide you with the care you need, when you need it.  We recommend signing up for the patient portal called "MyChart".  Sign up information is provided on this After Visit Summary.  MyChart is used to connect with patients for Virtual Visits (Telemedicine).  Patients are able to view lab/test results, encounter notes, upcoming appointments, etc.  Non-urgent messages can be sent to your provider as well.   To learn more about what you can do with MyChart, go to [email or web address].    Your next appointment:   2-3 week(s)  The format for your next appointment:   In Person  Provider:   Jyl Heinz, MD   Other Instructions Rivaroxaban oral tablets What is this medicine? RIVAROXABAN (ri va ROX a ban) is an anticoagulant (blood thinner). It is used to treat blood  clots in the lungs or in the veins. It is also used to prevent blood clots in the lungs or in the veins. It is also used to lower the chance of stroke in people with a medical condition called atrial fibrillation. This medicine may be used for other purposes; ask your health care provider or pharmacist if you have questions. COMMON BRAND NAME(S): Xarelto, Xarelto Starter Pack What should I tell my health care provider before I take this medicine? They need to know if you have any of these conditions:  antiphospholipid antibody syndrome  artificial heart valve  bleeding disorders  bleeding in the brain  blood in your stools (black or tarry stools) or if you have blood in your vomit  history of blood clots  history of stomach bleeding  kidney disease  liver disease  low blood counts, like low white cell, platelet, or red cell counts  recent or planned spinal or epidural procedure  take medicines that treat or prevent blood clots  an unusual or allergic reaction to rivaroxaban, other medicines, foods, dyes, or preservatives  pregnant or trying to get pregnant  breast-feeding How should I use this medicine? Take this medicine by mouth with a glass of water. Follow the directions on the prescription label. Take your medicine at regular intervals. Do not take it more often than directed. Do not stop taking except on your doctor's advice. Stopping this medicine may increase your risk of a blood clot. Be sure to refill your prescription before you run  out of medicine. If you are taking this medicine after hip or knee replacement surgery, take it with or without food. If you are taking this medicine for atrial fibrillation, take it with your evening meal. If you are taking this medicine to treat blood clots, take it with food at the same time each day. If you are unable to swallow your tablet, you may crush the tablet and mix it in applesauce. Then, immediately eat the applesauce. You  should eat more food right after you eat the applesauce containing the crushed tablet. Talk to your pediatrician regarding the use of this medicine in children. Special care may be needed. Overdosage: If you think you have taken too much of this medicine contact a poison control center or emergency room at once. NOTE: This medicine is only for you. Do not share this medicine with others. What if I miss a dose? If you take your medicine once a day and miss a dose, take the missed dose as soon as you remember. If it is almost time for your next dose, take only that dose. Do not take double or extra doses. If you take your medicine twice a day and miss a dose, take the missed dose immediately. In this instance, 2 tablets may be taken at the same time. The next day you should take 1 tablet twice a day as directed. What may interact with this medicine? Do not take this medicine with any of the following medications:  defibrotide This medicine may also interact with the following medications:  aspirin and aspirin-like medicines  certain antibiotics like erythromycin, azithromycin, and clarithromycin  certain medicines for fungal infections like ketoconazole and itraconazole  certain medicines for irregular heart beat like amiodarone, quinidine, dronedarone  certain medicines for seizures like carbamazepine, phenytoin  certain medicines that treat or prevent blood clots like warfarin, enoxaparin, and dalteparin  conivaptan  felodipine  indinavir  lopinavir; ritonavir  NSAIDS, medicines for pain and inflammation, like ibuprofen or naproxen  ranolazine  rifampin  ritonavir  SNRIs, medicines for depression, like desvenlafaxine, duloxetine, levomilnacipran, venlafaxine  SSRIs, medicines for depression, like citalopram, escitalopram, fluoxetine, fluvoxamine, paroxetine, sertraline  St. John's wort  verapamil This list may not describe all possible interactions. Give your health  care provider a list of all the medicines, herbs, non-prescription drugs, or dietary supplements you use. Also tell them if you smoke, drink alcohol, or use illegal drugs. Some items may interact with your medicine. What should I watch for while using this medicine? Visit your healthcare professional for regular checks on your progress. You may need blood work done while you are taking this medicine. Your condition will be monitored carefully while you are receiving this medicine. It is important not to miss any appointments. Avoid sports and activities that might cause injury while you are using this medicine. Severe falls or injuries can cause unseen bleeding. Be careful when using sharp tools or knives. Consider using an Copy. Take special care brushing or flossing your teeth. Report any injuries, bruising, or red spots on the skin to your healthcare professional. If you are going to need surgery or other procedure, tell your healthcare professional that you are taking this medicine. Wear a medical ID bracelet or chain. Carry a card that describes your disease and details of your medicine and dosage times. What side effects may I notice from receiving this medicine? Side effects that you should report to your doctor or health care professional as soon as possible:  allergic reactions like skin rash, itching or hives, swelling of the face, lips, or tongue  back pain  redness, blistering, peeling or loosening of the skin, including inside the mouth  signs and symptoms of bleeding such as bloody or black, tarry stools; red or dark-brown urine; spitting up blood or brown material that looks like coffee grounds; red spots on the skin; unusual bruising or bleeding from the eye, gums, or nose  signs and symptoms of a blood clot such as chest pain; shortness of breath; pain, swelling, or warmth in the leg  signs and symptoms of a stroke such as changes in vision; confusion; trouble speaking  or understanding; severe headaches; sudden numbness or weakness of the face, arm or leg; trouble walking; dizziness; loss of coordination Side effects that usually do not require medical attention (report to your doctor or health care professional if they continue or are bothersome):  dizziness  muscle pain This list may not describe all possible side effects. Call your doctor for medical advice about side effects. You may report side effects to FDA at 1-800-FDA-1088. Where should I keep my medicine? Keep out of the reach of children. Store at room temperature between 15 and 30 degrees C (59 and 86 degrees F). Throw away any unused medicine after the expiration date. NOTE: This sheet is a summary. It may not cover all possible information. If you have questions about this medicine, talk to your doctor, pharmacist, or health care provider.  2021 Elsevier/Gold Standard (2018-11-27 09:45:59)

## 2020-10-16 NOTE — Telephone Encounter (Signed)
Spoke with patient and told him to take them two together, that it is fine.

## 2020-10-17 LAB — TSH: TSH: 2.53 u[IU]/mL (ref 0.450–4.500)

## 2020-10-22 LAB — FECAL OCCULT BLOOD, IMMUNOCHEMICAL: Fecal Occult Bld: NEGATIVE

## 2020-10-24 ENCOUNTER — Ambulatory Visit: Payer: PPO | Admitting: Cardiology

## 2020-10-24 ENCOUNTER — Encounter: Payer: Self-pay | Admitting: Cardiology

## 2020-10-24 ENCOUNTER — Other Ambulatory Visit: Payer: Self-pay

## 2020-10-24 VITALS — BP 124/78 | HR 80 | Ht 69.0 in | Wt 178.0 lb

## 2020-10-24 DIAGNOSIS — I471 Supraventricular tachycardia: Secondary | ICD-10-CM | POA: Diagnosis not present

## 2020-10-24 DIAGNOSIS — Z952 Presence of prosthetic heart valve: Secondary | ICD-10-CM

## 2020-10-24 DIAGNOSIS — I480 Paroxysmal atrial fibrillation: Secondary | ICD-10-CM

## 2020-10-24 DIAGNOSIS — I10 Essential (primary) hypertension: Secondary | ICD-10-CM | POA: Diagnosis not present

## 2020-10-24 DIAGNOSIS — E785 Hyperlipidemia, unspecified: Secondary | ICD-10-CM

## 2020-10-24 NOTE — Patient Instructions (Signed)

## 2020-10-24 NOTE — Progress Notes (Signed)
Cardiology Office Note:    Date:  10/24/2020   ID:  Randall Edwards, DOB 1945-11-30, MRN [id number]  PCP:  Nicoletta Dress, MD  Cardiologist:  Jenean Lindau, MD   Referring MD: Nicoletta Dress, MD    ASSESSMENT:    1. Atrial tachycardia ([place])   2. Essential hypertension   3. Paroxysmal atrial fibrillation ([facility])   4. S/P AVR (aortic valve replacement)   5. Dyslipidemia    PLAN:    In order of problems listed above:  1. Primary prevention stressed with the patient.  Importance of compliance with diet medication stressed any vocalized understanding. 2. Paroxysmal atrial fibrillation: Fortunately is in sinus rhythm and feels much better today.  I discussed with the patient atrial fibrillation, disease process. Management and therapy including rate and rhythm control, anticoagulation benefits and potential risks were discussed extensively with the patient. Patient had multiple questions which were answered to patient's satisfaction. 3. Essential hypertension: Blood pressure stable and diet was emphasized. 4. Mixed dyslipidemia: On lipid-lowering therapy and stable lipids. 5. S/p TAVR: Stable and good effort tolerance. 6. Patient will be seen in follow-up appointment in 2 months or earlier if the patient has any concerns    Medication Adjustments/Labs and Tests Ordered: Current medicines are reviewed at length with the patient today.  Concerns regarding medicines are outlined above.  Orders Placed This Encounter  Procedures  . EKG 12-Lead   No orders of the defined types were placed in this encounter.    No chief complaint on file.    History of Present Illness:    Randall Edwards is a 75 y.o. male.  Patient has past medical history of aortic valve replacement with TAVR, essential hypertension and dyslipidemia.  He denies any problems at this time and takes care of activities of daily living.  When I saw him last time he was in atrial fibrillation diagnosed for the  first time.  He is taking Xarelto and double dose of sotalol and has tolerated it well.  No chest pain orthopnea or PND.  At the time of my evaluation, the patient is alert awake oriented and in no distress.  Past Medical History:  Diagnosis Date  . Ascending aorta dilatation ([place]) 05/16/2019  . Atrial tachycardia ([place])    s/p ablation and on sotolol  . BPH (benign prostatic hyperplasia)   . Chronic frontal sinusitis 09/12/2014  . Chronic sphenoidal sinusitis 09/12/2014  . Dyslipidemia 03/18/2015  . Essential hypertension 03/18/2015  . GERD (gastroesophageal reflux disease)   . H/O cardiac radiofrequency ablation 08/2016  . Hyperlipidemia   . Hypertension   . Hypertrophy of both inferior nasal turbinates 09/12/2014  . Multiple nasal polyps 09/12/2014  . Nasal septal deviation 09/12/2014  . S/P aorta repair   . S/P AVR (aortic valve replacement) 2010  . s/p valve-in-valve TAVR 07/17/2019   s/p VIV TAVR via the TA approach after failed TF case 07/03/19  . Severe aortic stenosis 05/16/2019  . Status post radiofrequency ablation for arrhythmia 03/18/2015  . Vertigo     Past Surgical History:  Procedure Laterality Date  . ABLATION OF DYSRHYTHMIC FOCUS    . CARDIAC VALVE REPLACEMENT    . CHOLECYSTECTOMY    . KIDNEY STONE SURGERY    . KNEE ARTHROSCOPY Bilateral   . MASS EXCISION Right 12/26/2017   Procedure: RIGHT INDEX EXCISION CYST AND DEBRIDEMENT OF DISTAL INTERPHALANGEAL JOINT;  Surgeon: Leanora Cover, MD;  Location: [facility];  Service: Orthopedics;  Laterality: Right;  Bier block  . RIGHT HEART CATH AND CORONARY ANGIOGRAPHY N/A 06/20/2019   Procedure: RIGHT HEART CATH AND CORONARY ANGIOGRAPHY;  Surgeon: Sherren Mocha, MD;  Location: [place] [facility];  Service: Cardiovascular;  Laterality: N/A;  . TEE WITHOUT CARDIOVERSION N/A 07/03/2019   Procedure: TRANSESOPHAGEAL ECHOCARDIOGRAM (TEE);  Surgeon: Sherren Mocha, MD;  Location: [place];  Service: Open Heart Surgery;   Laterality: N/A;  . TEE WITHOUT CARDIOVERSION N/A 07/17/2019   Procedure: TRANSESOPHAGEAL ECHOCARDIOGRAM (TEE);  Surgeon: Sherren Mocha, MD;  Location: [place];  Service: Open Heart Surgery;  Laterality: N/A;  . TRANSCATHETER AORTIC VALVE REPLACEMENT, TRANSAPICAL N/A 07/17/2019   Procedure: TRANSCATHETER AORTIC VALVE REPLACEMENT, TRANSAPICAL;  Surgeon: Sherren Mocha, MD;  Location: [place];  Service: Open Heart Surgery;  Laterality: N/A;  . TRANSCATHETER AORTIC VALVE REPLACEMENT, TRANSFEMORAL N/A 07/03/2019   Procedure: attempted TRANSCATHETER AORTIC VALVE REPLACEMENT, TRANSFEMORAL;  Surgeon: Sherren Mocha, MD;  Location: [place];  Service: Open Heart Surgery;  Laterality: N/A;    Current Medications: Current Meds  Medication Sig  . aspirin EC 81 MG tablet Take 81 mg by mouth daily.   . DULoxetine (CYMBALTA) 60 MG capsule Take 60 mg by mouth daily.  . fexofenadine (ALLEGRA) 180 MG tablet Take 180 mg by mouth 2 (two) times daily.   Marland Kitchen ibuprofen (ADVIL) 800 MG tablet Take 1,600 mg by mouth as directed. BACK PAIN  . ondansetron (ZOFRAN) 4 MG tablet Take 4 mg by mouth every 8 (eight) hours as needed (INNER EAR).  . pantoprazole (PROTONIX) 40 MG tablet Take 40 mg by mouth daily.  . potassium chloride (KLOR-CON) 10 MEQ tablet Take 10 mEq by mouth daily.  . rivaroxaban (XARELTO) 20 MG TABS tablet Take 1 tablet (20 mg total) by mouth daily with supper.  . rosuvastatin (CRESTOR) 10 MG tablet TAKE ONE TABLET BY MOUTH EVERY DAY IN THE EVENING  . sotalol (BETAPACE) 80 MG tablet Take 1 tablet (80 mg total) by mouth 2 (two) times daily.  . tamsulosin (FLOMAX) 0.4 MG CAPS capsule Take 0.4 mg by mouth daily after supper.  . temazepam (RESTORIL) 15 MG capsule Take 15 mg by mouth at bedtime.   . traMADol (ULTRAM) 50 MG tablet Take 50 mg by mouth every 6 (six) hours as needed (back pain).  . triamterene-hydrochlorothiazide (MAXZIDE-25) 37.5-25 MG tablet TAKE ONE TABLET BY MOUTH EVERY DAY     Allergies:    Patient has no known allergies.   Social History   Socioeconomic History  . Marital status: Married    Spouse name: Not on file  . Number of children: Not on file  . Years of education: Not on file  . Highest education level: Not on file  Occupational History  . Not on file  Tobacco Use  . Smoking status: Never Smoker  . Smokeless tobacco: Never Used  Vaping Use  . Vaping Use: Never used  Substance and Sexual Activity  . Alcohol use: No  . Drug use: No  . Sexual activity: Not on file  Other Topics Concern  . Not on file  Social History Narrative  . Not on file   Social Determinants of Health   Financial Resource Strain: Not on file  Food Insecurity: Not on file  Transportation Needs: Not on file  Physical Activity: Not on file  Stress: Not on file  Social Connections: Not on file     Family History: The patient's family history includes CAD in his brother.  ROS:  Please see the history of present illness.    All other systems reviewed and are negative.  EKGs/Labs/Other Studies Reviewed:    The following studies were reviewed today: I discussed my findings with the patient at length.  EKG reveals sinus rhythm and nonspecific ST-T changes   Recent Labs: 07/09/2020: Hemoglobin 15.1; Platelets 189 08/14/2020: BUN 13; Creatinine, Ser 0.96; Potassium 4.4; Sodium 144 10/16/2020: TSH 2.530  Recent Lipid Panel No results found for: CHOL, TRIG, HDL, CHOLHDL, VLDL, LDLCALC, LDLDIRECT  Physical Exam:    VS:  BP 124/78   Pulse 80   Ht 5\' 9"  (1.753 m)   Wt 178 lb (80.7 kg)   SpO2 98%   BMI 26.29 kg/m     Wt Readings from Last 3 Encounters:  10/24/20 178 lb (80.7 kg)  10/16/20 181 lb 12.8 oz (82.5 kg)  08/14/20 183 lb 3.2 oz (83.1 kg)     GEN: Patient is in no acute distress HEENT: Normal NECK: No JVD; No carotid bruits LYMPHATICS: No lymphadenopathy CARDIAC: Hear sounds regular, 2/6 systolic murmur at the apex. RESPIRATORY:  Clear to auscultation without  rales, wheezing or rhonchi  ABDOMEN: Soft, non-tender, non-distended MUSCULOSKELETAL:  No edema; No deformity  SKIN: Warm and dry NEUROLOGIC:  Alert and oriented x 3 PSYCHIATRIC:  Normal affect   Signed, Jenean Lindau, MD  10/24/2020 11:02 AM    [place]

## 2020-12-17 ENCOUNTER — Ambulatory Visit: Payer: PPO | Admitting: Cardiology

## 2020-12-17 ENCOUNTER — Other Ambulatory Visit: Payer: Self-pay

## 2020-12-17 ENCOUNTER — Encounter: Payer: Self-pay | Admitting: Cardiology

## 2020-12-17 VITALS — BP 124/84 | HR 78 | Ht 69.0 in | Wt 183.8 lb

## 2020-12-17 DIAGNOSIS — E782 Mixed hyperlipidemia: Secondary | ICD-10-CM | POA: Diagnosis not present

## 2020-12-17 DIAGNOSIS — Z952 Presence of prosthetic heart valve: Secondary | ICD-10-CM

## 2020-12-17 DIAGNOSIS — I480 Paroxysmal atrial fibrillation: Secondary | ICD-10-CM

## 2020-12-17 DIAGNOSIS — I77810 Thoracic aortic ectasia: Secondary | ICD-10-CM

## 2020-12-17 DIAGNOSIS — I10 Essential (primary) hypertension: Secondary | ICD-10-CM

## 2020-12-17 HISTORY — DX: Paroxysmal atrial fibrillation: I48.0

## 2020-12-17 NOTE — Progress Notes (Signed)
Cardiology Office Note:    Date:  12/17/2020   ID:  Randall Edwards, DOB [birth year], MRN [id number]  PCP:  Nicoletta Dress, MD  Cardiologist:  Jenean Lindau, MD   Referring MD: Nicoletta Dress, MD    ASSESSMENT:    1. Essential hypertension   2. Mixed hyperlipidemia   3. Ascending aorta dilatation (HCC)   4. s/p valve-in-valve TAVR   5. PAF (paroxysmal atrial fibrillation) (HCC)    PLAN:    In order of problems listed above:  1. Primary prevention stressed with patient.  Importance of compliance with diet medication stressed any vocalized understanding.  He was advised to walk at least half an hour a day 5 days a week and he promises to do so. 2. Essential hypertension: Blood pressure stable and diet was emphasized. 3. Mixed dyslipidemia: Lipids reviewed diet emphasized. 4. S/p aortic valve replacement: Stable..  Echo report reviewed. 5. Paroxysmal atrial fibrillation:I discussed with the patient atrial fibrillation, disease process. Management and therapy including rate and rhythm control, anticoagulation benefits and potential risks were discussed extensively with the patient. Patient had multiple questions which were answered to patient's satisfaction. 6. Ascending aortic dilatation: Stable at this time and will continue to monitor.  He wants to undergo evaluation when he sees him in follow-up appointment in 6 months and I respect his wishes. 7. Patient will be seen in follow-up appointment in 6 months or earlier if the patient has any concerns    Medication Adjustments/Labs and Tests Ordered: Current medicines are reviewed at length with the patient today.  Concerns regarding medicines are outlined above.  No orders of the defined types were placed in this encounter.  No orders of the defined types were placed in this encounter.    No chief complaint on file.    History of Present Illness:    Randall Edwards is a 75 y.o. male.  Patient has past medical history  of aortic valve replacement, ascending aortic dilatation, essential hypertension mixed dyslipidemia and paroxysmal atrial fibrillation.  He denies any problems at this time and takes care of activities of daily living.  No chest pain orthopnea or PND.  At the time of my evaluation, the patient is alert awake oriented and in no distress.  Past Medical History:  Diagnosis Date  . Ascending aorta dilatation ([place]) 05/16/2019  . Atrial tachycardia ([place])    s/p ablation and on sotolol  . BPH (benign prostatic hyperplasia)   . Chronic frontal sinusitis 09/12/2014  . Chronic sphenoidal sinusitis 09/12/2014  . Dyslipidemia 03/18/2015  . Essential hypertension 03/18/2015  . GERD (gastroesophageal reflux disease)   . H/O cardiac radiofrequency ablation 08/2016  . Hyperlipidemia   . Hypertension   . Hypertrophy of both inferior nasal turbinates 09/12/2014  . Multiple nasal polyps 09/12/2014  . Nasal septal deviation 09/12/2014  . S/P aorta repair   . S/P AVR (aortic valve replacement) 2010  . s/p valve-in-valve TAVR 07/17/2019   s/p VIV TAVR via the TA approach after failed TF case 07/03/19  . Severe aortic stenosis 05/16/2019  . Status post radiofrequency ablation for arrhythmia 03/18/2015  . Vertigo     Past Surgical History:  Procedure Laterality Date  . ABLATION OF DYSRHYTHMIC FOCUS    . CARDIAC VALVE REPLACEMENT    . CHOLECYSTECTOMY    . KIDNEY STONE SURGERY    . KNEE ARTHROSCOPY Bilateral   . MASS EXCISION Right 12/26/2017   Procedure: RIGHT INDEX EXCISION CYST AND DEBRIDEMENT OF  DISTAL INTERPHALANGEAL JOINT;  Surgeon: Leanora Cover, MD;  Location: [place];  Service: Orthopedics;  Laterality: Right;  Bier block  . RIGHT HEART CATH AND CORONARY ANGIOGRAPHY N/A 06/20/2019   Procedure: RIGHT HEART CATH AND CORONARY ANGIOGRAPHY;  Surgeon: Sherren Mocha, MD;  Location: [place] [facility];  Service: Cardiovascular;  Laterality: N/A;  . TEE WITHOUT CARDIOVERSION N/A 07/03/2019    Procedure: TRANSESOPHAGEAL ECHOCARDIOGRAM (TEE);  Surgeon: Sherren Mocha, MD;  Location: [place];  Service: Open Heart Surgery;  Laterality: N/A;  . TEE WITHOUT CARDIOVERSION N/A 07/17/2019   Procedure: TRANSESOPHAGEAL ECHOCARDIOGRAM (TEE);  Surgeon: Sherren Mocha, MD;  Location: [place];  Service: Open Heart Surgery;  Laterality: N/A;  . TRANSCATHETER AORTIC VALVE REPLACEMENT, TRANSAPICAL N/A 07/17/2019   Procedure: TRANSCATHETER AORTIC VALVE REPLACEMENT, TRANSAPICAL;  Surgeon: Sherren Mocha, MD;  Location: [place];  Service: Open Heart Surgery;  Laterality: N/A;  . TRANSCATHETER AORTIC VALVE REPLACEMENT, TRANSFEMORAL N/A 07/03/2019   Procedure: attempted TRANSCATHETER AORTIC VALVE REPLACEMENT, TRANSFEMORAL;  Surgeon: Sherren Mocha, MD;  Location: [place];  Service: Open Heart Surgery;  Laterality: N/A;    Current Medications: Current Meds  Medication Sig  . aspirin EC 81 MG tablet Take 81 mg by mouth daily.   . DULoxetine (CYMBALTA) 60 MG capsule Take 60 mg by mouth daily.  . fexofenadine (ALLEGRA) 180 MG tablet Take 180 mg by mouth 2 (two) times daily.   Marland Kitchen ibuprofen (ADVIL) 800 MG tablet Take 1,600 mg by mouth every 8 (eight) hours as needed for pain.  Marland Kitchen ondansetron (ZOFRAN) 4 MG tablet Take 4 mg by mouth every 8 (eight) hours as needed (INNER EAR).  . pantoprazole (PROTONIX) 40 MG tablet Take 40 mg by mouth daily.  . potassium chloride (KLOR-CON) 10 MEQ tablet Take 10 mEq by mouth daily.  . rivaroxaban (XARELTO) 20 MG TABS tablet Take 1 tablet (20 mg total) by mouth daily with supper.  . rosuvastatin (CRESTOR) 10 MG tablet Take 10 mg by mouth daily.  . sotalol (BETAPACE) 80 MG tablet Take 1 tablet (80 mg total) by mouth 2 (two) times daily.  . tamsulosin (FLOMAX) 0.4 MG CAPS capsule Take 0.4 mg by mouth daily after supper.  . temazepam (RESTORIL) 15 MG capsule Take 15 mg by mouth at bedtime.   . traMADol (ULTRAM) 50 MG tablet Take 50 mg by mouth every 6 (six) hours as needed (back pain).   . triamterene-hydrochlorothiazide (MAXZIDE-25) 37.5-25 MG tablet Take 1 tablet by mouth daily.  . [DISCONTINUED] rosuvastatin (CRESTOR) 20 MG tablet Take 20 mg by mouth daily.     Allergies:   Patient has no known allergies.   Social History   Socioeconomic History  . Marital status: Married    Spouse name: Not on file  . Number of children: Not on file  . Years of education: Not on file  . Highest education level: Not on file  Occupational History  . Not on file  Tobacco Use  . Smoking status: Never Smoker  . Smokeless tobacco: Never Used  Vaping Use  . Vaping Use: Never used  Substance and Sexual Activity  . Alcohol use: No  . Drug use: No  . Sexual activity: Not on file  Other Topics Concern  . Not on file  Social History Narrative  . Not on file   Social Determinants of Health   Financial Resource Strain: Not on file  Food Insecurity: Not on file  Transportation Needs: Not on file  Physical Activity: Not on  file  Stress: Not on file  Social Connections: Not on file     Family History: The patient's family history includes CAD in his brother.  ROS:   Please see the history of present illness.    All other systems reviewed and are negative.  EKGs/Labs/Other Studies Reviewed:    The following studies were reviewed today:   IMPRESSIONS   1. Left ventricular ejection fraction, by estimation, is 60 to 65%. The  left ventricle has normal function. The left ventricle has no regional  wall motion abnormalities. There is severe left ventricular hypertrophy.  Left ventricular diastolic parameters  are consistent with Grade II diastolic dysfunction (pseudonormalization).  2. Right ventricular systolic function is normal. The right ventricular  size is normal.  3. Left atrial size was mildly dilated.  4. The mitral valve is normal in structure. Trivial mitral valve  regurgitation. No evidence of mitral stenosis.  5. Prior AVR with 25 mm Edwards  bioprosthetic valve with valve in valve  TAVR using 23 mm Medtronic Evolut valve. No PVL. Gradients only slightly  increased from echo done 08/16/19 mean 14 mm->17 mmHg peak 28-> 36 mmHg  Current DVI 0.42 and AVA 1.5 cm2. The  aortic valve has been repaired/replaced. Aortic valve regurgitation is  not visualized. No aortic stenosis is present.  6. Aortic dilatation noted. There is mild dilatation of the ascending  aorta, measuring 40 mm.  7. The inferior vena cava is normal in size with greater than 50%  respiratory variability, suggesting right atrial pressure of 3 mmHg.    Recent Labs: 07/09/2020: Hemoglobin 15.1; Platelets 189 08/14/2020: BUN 13; Creatinine, Ser 0.96; Potassium 4.4; Sodium 144 10/16/2020: TSH 2.530  Recent Lipid Panel No results found for: CHOL, TRIG, HDL, CHOLHDL, VLDL, LDLCALC, LDLDIRECT  Physical Exam:    VS:  BP 124/84   Pulse 78   Ht 5\' 9"  (1.753 m)   Wt 183 lb 12.8 oz (83.4 kg)   SpO2 98%   BMI 27.14 kg/m     Wt Readings from Last 3 Encounters:  12/17/20 183 lb 12.8 oz (83.4 kg)  10/24/20 178 lb (80.7 kg)  10/16/20 181 lb 12.8 oz (82.5 kg)     GEN: Patient is in no acute distress HEENT: Normal NECK: No JVD; No carotid bruits LYMPHATICS: No lymphadenopathy CARDIAC: Hear sounds regular, 2/6 systolic murmur at the apex. RESPIRATORY:  Clear to auscultation without rales, wheezing or rhonchi  ABDOMEN: Soft, non-tender, non-distended MUSCULOSKELETAL:  No edema; No deformity  SKIN: Warm and dry NEUROLOGIC:  Alert and oriented x 3 PSYCHIATRIC:  Normal affect   Signed, Jenean Lindau, MD  12/17/2020 1:33 PM    [facility]

## 2020-12-17 NOTE — Patient Instructions (Signed)

## 2020-12-24 ENCOUNTER — Other Ambulatory Visit: Payer: Self-pay | Admitting: Physician Assistant

## 2020-12-24 NOTE — Telephone Encounter (Signed)
Called pt and pt stated that he is still taking this medication. Would Dr. Burt Knack like to refill this medication? Please address

## 2020-12-24 NOTE — Telephone Encounter (Signed)
Pt's pharmacy is requesting a refill on pantoprazole. Would Dr. Cooper like to refill this medication? Please address 

## 2021-01-02 ENCOUNTER — Other Ambulatory Visit: Payer: Self-pay

## 2021-01-02 NOTE — Telephone Encounter (Signed)
error 

## 2021-01-12 DIAGNOSIS — Z79899 Other long term (current) drug therapy: Secondary | ICD-10-CM | POA: Diagnosis not present

## 2021-01-12 DIAGNOSIS — Z139 Encounter for screening, unspecified: Secondary | ICD-10-CM | POA: Diagnosis not present

## 2021-01-12 DIAGNOSIS — I10 Essential (primary) hypertension: Secondary | ICD-10-CM | POA: Diagnosis not present

## 2021-01-12 DIAGNOSIS — E785 Hyperlipidemia, unspecified: Secondary | ICD-10-CM | POA: Diagnosis not present

## 2021-01-12 DIAGNOSIS — I480 Paroxysmal atrial fibrillation: Secondary | ICD-10-CM | POA: Diagnosis not present

## 2021-01-12 DIAGNOSIS — F331 Major depressive disorder, recurrent, moderate: Secondary | ICD-10-CM | POA: Diagnosis not present

## 2021-01-12 DIAGNOSIS — F5104 Psychophysiologic insomnia: Secondary | ICD-10-CM | POA: Diagnosis not present

## 2021-02-10 ENCOUNTER — Telehealth: Payer: Self-pay | Admitting: Cardiology

## 2021-02-10 NOTE — Telephone Encounter (Signed)
Pt confirmed that he does not have a pacemaker. Recommendations reviewed with pt as per Dr. Julien Nordmann note.  Pt verbalized understanding and had no additional questions.

## 2021-02-10 NOTE — Telephone Encounter (Signed)
Patient wanted to know if it would be safe for him to use a TENS unit. Please advise

## 2021-02-20 DIAGNOSIS — J40 Bronchitis, not specified as acute or chronic: Secondary | ICD-10-CM | POA: Diagnosis not present

## 2021-03-04 DIAGNOSIS — R3912 Poor urinary stream: Secondary | ICD-10-CM | POA: Diagnosis not present

## 2021-03-04 DIAGNOSIS — R972 Elevated prostate specific antigen [PSA]: Secondary | ICD-10-CM | POA: Diagnosis not present

## 2021-03-04 DIAGNOSIS — N401 Enlarged prostate with lower urinary tract symptoms: Secondary | ICD-10-CM | POA: Diagnosis not present

## 2021-03-10 ENCOUNTER — Other Ambulatory Visit: Payer: Self-pay | Admitting: Physician Assistant

## 2021-03-17 ENCOUNTER — Other Ambulatory Visit: Payer: Self-pay | Admitting: Cardiology

## 2021-03-19 DIAGNOSIS — J019 Acute sinusitis, unspecified: Secondary | ICD-10-CM | POA: Diagnosis not present

## 2021-03-19 DIAGNOSIS — J208 Acute bronchitis due to other specified organisms: Secondary | ICD-10-CM | POA: Diagnosis not present

## 2021-03-19 DIAGNOSIS — B9689 Other specified bacterial agents as the cause of diseases classified elsewhere: Secondary | ICD-10-CM | POA: Diagnosis not present

## 2021-03-19 DIAGNOSIS — K068 Other specified disorders of gingiva and edentulous alveolar ridge: Secondary | ICD-10-CM | POA: Diagnosis not present

## 2021-03-19 NOTE — Telephone Encounter (Signed)
Triamterene 37.5 mg - Hydrochlorothiazide 25 mg # 90 tablets x 3 refills sent to [facility], [place]

## 2021-04-14 DIAGNOSIS — Z79899 Other long term (current) drug therapy: Secondary | ICD-10-CM | POA: Diagnosis not present

## 2021-04-14 DIAGNOSIS — R972 Elevated prostate specific antigen [PSA]: Secondary | ICD-10-CM | POA: Diagnosis not present

## 2021-04-14 DIAGNOSIS — E785 Hyperlipidemia, unspecified: Secondary | ICD-10-CM | POA: Diagnosis not present

## 2021-04-14 DIAGNOSIS — K219 Gastro-esophageal reflux disease without esophagitis: Secondary | ICD-10-CM | POA: Diagnosis not present

## 2021-04-14 DIAGNOSIS — F5104 Psychophysiologic insomnia: Secondary | ICD-10-CM | POA: Diagnosis not present

## 2021-04-14 DIAGNOSIS — I480 Paroxysmal atrial fibrillation: Secondary | ICD-10-CM | POA: Diagnosis not present

## 2021-04-14 DIAGNOSIS — K449 Diaphragmatic hernia without obstruction or gangrene: Secondary | ICD-10-CM | POA: Diagnosis not present

## 2021-04-14 DIAGNOSIS — L723 Sebaceous cyst: Secondary | ICD-10-CM | POA: Diagnosis not present

## 2021-04-14 DIAGNOSIS — F331 Major depressive disorder, recurrent, moderate: Secondary | ICD-10-CM | POA: Diagnosis not present

## 2021-04-14 DIAGNOSIS — I10 Essential (primary) hypertension: Secondary | ICD-10-CM | POA: Diagnosis not present

## 2021-04-18 ENCOUNTER — Other Ambulatory Visit: Payer: Self-pay | Admitting: Cardiology

## 2021-04-24 DIAGNOSIS — J019 Acute sinusitis, unspecified: Secondary | ICD-10-CM | POA: Diagnosis not present

## 2021-04-24 DIAGNOSIS — B9689 Other specified bacterial agents as the cause of diseases classified elsewhere: Secondary | ICD-10-CM | POA: Diagnosis not present

## 2021-04-28 ENCOUNTER — Other Ambulatory Visit: Payer: Self-pay

## 2021-04-28 MED ORDER — RIVAROXABAN 20 MG PO TABS: 20.0000 mg | ORAL_TABLET | Freq: Every day | ORAL | 6 refills | Status: DC

## 2021-04-28 NOTE — Telephone Encounter (Signed)
Prescription refill request for Xarelto received.  Indication: Afib Last office visit: 12/17/20 ([place]) Weight: 83.4kg Age: 75 Scr: 1.04 (01/12/21) CrCl: 3m/min  Appropriate dose and refill sent to requested pharmacy.

## 2021-06-08 DIAGNOSIS — R972 Elevated prostate specific antigen [PSA]: Secondary | ICD-10-CM | POA: Diagnosis not present

## 2021-06-11 ENCOUNTER — Other Ambulatory Visit: Payer: Self-pay

## 2021-06-16 ENCOUNTER — Encounter: Payer: Self-pay | Admitting: Cardiology

## 2021-06-16 ENCOUNTER — Ambulatory Visit: Payer: PPO | Admitting: Cardiology

## 2021-06-16 ENCOUNTER — Other Ambulatory Visit: Payer: Self-pay

## 2021-06-16 VITALS — BP 134/86 | HR 82 | Ht 69.0 in | Wt 184.6 lb

## 2021-06-16 DIAGNOSIS — I10 Essential (primary) hypertension: Secondary | ICD-10-CM

## 2021-06-16 DIAGNOSIS — E782 Mixed hyperlipidemia: Secondary | ICD-10-CM

## 2021-06-16 DIAGNOSIS — I480 Paroxysmal atrial fibrillation: Secondary | ICD-10-CM

## 2021-06-16 DIAGNOSIS — Z952 Presence of prosthetic heart valve: Secondary | ICD-10-CM

## 2021-06-16 NOTE — Progress Notes (Signed)
Cardiology Office Note:    Date:  06/16/2021   ID:  Randall Edwards, DOB Nov 03, 1945, MRN [id number]  PCP:  Nicoletta Dress, MD  Cardiologist:  Jenean Lindau, MD   Referring MD: Nicoletta Dress, MD    ASSESSMENT:    1. Essential hypertension   2. Mixed hyperlipidemia   3. PAF (paroxysmal atrial fibrillation) (HCC)   4. s/p valve-in-valve TAVR    PLAN:    In order of problems listed above:  Primary prevention stressed with the patient.  Importance of compliance with diet medication stressed any vocalized understanding. Essential hypertension: Blood pressure stable and diet was emphasized.  Lifestyle modification urged. Post TAVR: Stable at this time and asymptomatic.  He seems to be doing well.  I told him that he needs to continue aspirin. Paroxysmal atrial fibrillation:I discussed with the patient atrial fibrillation, disease process. Management and therapy including rate and rhythm control, anticoagulation benefits and potential risks were discussed extensively with the patient. Patient had multiple questions which were answered to patient's satisfaction. I agree with the fact that his anticoagulation can be withheld during his prostate biopsy.  Benefits and potential risks explained and he understands.  Again he was advised to continue aspirin and uninterrupted fashion and he understands. Patient will be seen in follow-up appointment in 6 months or earlier if the patient has any concerns    Medication Adjustments/Labs and Tests Ordered: Current medicines are reviewed at length with the patient today.  Concerns regarding medicines are outlined above.  No orders of the defined types were placed in this encounter.  No orders of the defined types were placed in this encounter.    No chief complaint on file.    History of Present Illness:    Randall Edwards is a 75 y.o. male.  Patient has past medical history of aortic valve replacement after which she received a  transaortic valve.  He denies any problems at this time and takes care of activities of daily living.  No chest pain orthopnea or PND.  He has history of paroxysmal atrial fibrillation, essential hypertension and mixed dyslipidemia.  At the time of my evaluation, the patient is alert awake oriented and in no distress.  He leads a sedentary lifestyle because of orthopedic issues involving his leg.  He plans to undergo prostate biopsy and has been okayed by Dr. Burt Knack his cardiologist who did the TAVR.  Past Medical History:  Diagnosis Date   Ascending aorta dilatation ([place]) 05/16/2019   Atrial tachycardia (HCC)    s/p ablation and on sotolol   BPH (benign prostatic hyperplasia)    Chronic frontal sinusitis 09/12/2014   Chronic sphenoidal sinusitis 09/12/2014   Dyslipidemia 03/18/2015   Essential hypertension 03/18/2015   GERD (gastroesophageal reflux disease)    H/O cardiac radiofrequency ablation 08/2016   Hyperlipidemia    Hypertension    Hypertrophy of both inferior nasal turbinates 09/12/2014   Multiple nasal polyps 09/12/2014   Nasal septal deviation 09/12/2014   PAF (paroxysmal atrial fibrillation) ([employer]) 12/17/2020   S/P aorta repair    S/P AVR (aortic valve replacement) 2010   s/p valve-in-valve TAVR 07/17/2019   s/p VIV TAVR via the TA approach after failed TF case 07/03/19   Severe aortic stenosis 05/16/2019   Status post radiofrequency ablation for arrhythmia 03/18/2015   Vertigo     Past Surgical History:  Procedure Laterality Date   ABLATION OF DYSRHYTHMIC FOCUS     CARDIAC VALVE REPLACEMENT  CHOLECYSTECTOMY     KIDNEY STONE SURGERY     KNEE ARTHROSCOPY Bilateral    MASS EXCISION Right 12/26/2017   Procedure: RIGHT INDEX EXCISION CYST AND DEBRIDEMENT OF DISTAL INTERPHALANGEAL JOINT;  Surgeon: Leanora Cover, MD;  Location: [employer];  Service: Orthopedics;  Laterality: Right;  Bier block   RIGHT HEART CATH AND CORONARY ANGIOGRAPHY N/A 06/20/2019   Procedure: RIGHT  HEART CATH AND CORONARY ANGIOGRAPHY;  Surgeon: Sherren Mocha, MD;  Location: [place] [facility];  Service: Cardiovascular;  Laterality: N/A;   TEE WITHOUT CARDIOVERSION N/A 07/03/2019   Procedure: TRANSESOPHAGEAL ECHOCARDIOGRAM (TEE);  Surgeon: Sherren Mocha, MD;  Location: [place];  Service: Open Heart Surgery;  Laterality: N/A;   TEE WITHOUT CARDIOVERSION N/A 07/17/2019   Procedure: TRANSESOPHAGEAL ECHOCARDIOGRAM (TEE);  Surgeon: Sherren Mocha, MD;  Location: [place];  Service: Open Heart Surgery;  Laterality: N/A;   TRANSCATHETER AORTIC VALVE REPLACEMENT, TRANSAPICAL N/A 07/17/2019   Procedure: TRANSCATHETER AORTIC VALVE REPLACEMENT, TRANSAPICAL;  Surgeon: Sherren Mocha, MD;  Location: [place];  Service: Open Heart Surgery;  Laterality: N/A;   TRANSCATHETER AORTIC VALVE REPLACEMENT, TRANSFEMORAL N/A 07/03/2019   Procedure: attempted TRANSCATHETER AORTIC VALVE REPLACEMENT, TRANSFEMORAL;  Surgeon: Sherren Mocha, MD;  Location: [place];  Service: Open Heart Surgery;  Laterality: N/A;    Current Medications: Current Meds  Medication Sig   DULoxetine (CYMBALTA) 60 MG capsule Take 60 mg by mouth daily.   fexofenadine (ALLEGRA) 180 MG tablet Take 180 mg by mouth 2 (two) times daily.    ibuprofen (ADVIL) 800 MG tablet Take 1,600 mg by mouth every 8 (eight) hours as needed for pain.   meclizine (ANTIVERT) 25 MG tablet Take 25 mg by mouth 3 (three) times daily as needed for dizziness.   omeprazole (PRILOSEC) 20 MG capsule Take 20 mg by mouth daily.   potassium chloride (KLOR-CON) 10 MEQ tablet Take 10 mEq by mouth daily.   rivaroxaban (XARELTO) 20 MG TABS tablet Take 1 tablet (20 mg total) by mouth daily with supper.   rosuvastatin (CRESTOR) 10 MG tablet Take 10 mg by mouth daily.   sotalol (BETAPACE) 80 MG tablet Take 1 tablet (80 mg total) by mouth 2 (two) times daily.   tamsulosin (FLOMAX) 0.4 MG CAPS capsule Take 0.4 mg by mouth daily after supper.   temazepam (RESTORIL) 15 MG capsule Take 15 mg  by mouth at bedtime.    traMADol (ULTRAM) 50 MG tablet Take 50 mg by mouth every 6 (six) hours as needed for pain (back pain).   triamterene-hydrochlorothiazide (MAXZIDE-25) 37.5-25 MG tablet TAKE ONE TABLET BY MOUTH EVERY DAY     Allergies:   Patient has no known allergies.   Social History   Socioeconomic History   Marital status: Married    Spouse name: Not on file   Number of children: Not on file   Years of education: Not on file   Highest education level: Not on file  Occupational History   Not on file  Tobacco Use   Smoking status: Never   Smokeless tobacco: Never  Vaping Use   Vaping Use: Never used  Substance and Sexual Activity   Alcohol use: No   Drug use: No   Sexual activity: Not on file  Other Topics Concern   Not on file  Social History Narrative   Not on file   Social Determinants of Health   Financial Resource Strain: Not on file  Food Insecurity: Not on file  Transportation Needs: Not on file  Physical Activity: Not on file  Stress: Not on file  Social Connections: Not on file     Family History: The patient's family history includes CAD in his brother.  ROS:   Please see the history of present illness.    All other systems reviewed and are negative.  EKGs/Labs/Other Studies Reviewed:    The following studies were reviewed today: I discussed my findings with the patient at length   Recent Labs: 07/09/2020: Hemoglobin 15.1; Platelets 189 08/14/2020: BUN 13; Creatinine, Ser 0.96; Potassium 4.4; Sodium 144 10/16/2020: TSH 2.530  Recent Lipid Panel No results found for: CHOL, TRIG, HDL, CHOLHDL, VLDL, LDLCALC, LDLDIRECT  Physical Exam:    VS:  BP 134/86   Pulse 82   Ht 5\' 9"  (1.753 m)   Wt 184 lb 9.6 oz (83.7 kg)   SpO2 96%   BMI 27.26 kg/m     Wt Readings from Last 3 Encounters:  06/16/21 184 lb 9.6 oz (83.7 kg)  12/17/20 183 lb 12.8 oz (83.4 kg)  10/24/20 178 lb (80.7 kg)     GEN: Patient is in no acute distress HEENT:  Normal NECK: No JVD; No carotid bruits LYMPHATICS: No lymphadenopathy CARDIAC: Hear sounds regular, 2/6 systolic murmur at the apex. RESPIRATORY:  Clear to auscultation without rales, wheezing or rhonchi  ABDOMEN: Soft, non-tender, non-distended MUSCULOSKELETAL:  No edema; No deformity  SKIN: Warm and dry NEUROLOGIC:  Alert and oriented x 3 PSYCHIATRIC:  Normal affect   Signed, Jenean Lindau, MD  06/16/2021 5:20 PM    [facility]

## 2021-06-16 NOTE — Patient Instructions (Signed)

## 2021-06-18 ENCOUNTER — Telehealth: Payer: Self-pay | Admitting: *Deleted

## 2021-06-18 DIAGNOSIS — I700 Atherosclerosis of aorta: Secondary | ICD-10-CM

## 2021-06-18 HISTORY — DX: Atherosclerosis of aorta: I70.0

## 2021-06-18 NOTE — Telephone Encounter (Signed)
   Patient Name: Randall Edwards  DOB: 08-Mar-1946 MRN: [id number]  Primary Cardiologist: Dr. Geraldo Pitter  Chart reviewed as part of pre-operative protocol coverage. Pre-op clearance already addressed by colleagues in earlier notes. To summarize recommendations:  - Preop clearance addressed in 06/16/21 OV with Dr. Geraldo Pitter. Will route this note to requesting surgeon. - Per pharmacist review, "Per office protocol, patient can hold Xarelto for 3 days prior to procedure as requested.  Will route this bundled recommendation with provider note to requesting provider via Epic fax function and remove from pre-op pool. Please call with questions.  Charlie Pitter, PA-C 06/18/2021, 5:38 PM

## 2021-06-18 NOTE — Telephone Encounter (Signed)
   [facility] Pre-operative Risk Assessment    Patient Name: Randall Edwards  DOB: 24-Jul-1946 MRN: [id number]  HEARTCARE STAFF:  - IMPORTANT!!!!!! Under Visit Info/Reason for Call, type in Other and utilize the format Clearance MM/DD/YY or Clearance TBD. Do not use dashes or single digits. - Please review there is not already an duplicate clearance open for this procedure. - If request is for dental extraction, please clarify the # of teeth to be extracted. - If the patient is currently at the dentist's office, call Pre-Op Callback Staff (MA/nurse) to input urgent request.  - If the patient is not currently in the dentist office, please route to the Pre-Op pool.  Request for surgical clearance:  What type of surgery is being performed?  PROSTATE BIOPSY  When is this surgery scheduled?  07/01/2021  What type of clearance is required (medical clearance vs. Pharmacy clearance to hold med vs. Both)?  BOTH  Are there any medications that need to be held prior to surgery and how long?  Warnell Forester 3 DAYS  Practice name and name of physician performing surgery?  [facility] / DR. BORDEN  What is the office phone number?  [phone]   7.   What is the office fax number?  [phone]  ATTN:  SON[place]   8.   Anesthesia type (None, local, MAC, general) ?     Jeanann Lewandowsky 06/18/2021, 7:10 AM  _________________________________________________________________   (provider comments below)

## 2021-06-18 NOTE — Telephone Encounter (Signed)
   Patient Name: Randall Edwards  DOB: March 09, 1946 MRN: [id number]  Primary Cardiologist: Dr. Geraldo Pitter  Chart reviewed as part of pre-operative protocol coverage. Patient just seen in clinic 10/4 at which time cardiac clearance was addressed, but will route to pharm for input on how long to hold anticoagulation.   Charlie Pitter, PA-C 06/18/2021, 9:14 AM

## 2021-06-18 NOTE — Telephone Encounter (Signed)
Patient with diagnosis of afib on Xarelto for anticoagulation.    Procedure: prostate biopsy Date of procedure: 07/01/21  CHA2DS2-VASc Score = 3  This indicates a 3.2% annual risk of stroke. The patient's score is based upon: CHF History: 0 HTN History: 1 Diabetes History: 0 Stroke History: 0 Vascular Disease History: 1 Age Score: 1 Gender Score: 0   CrCl 75mL/min Platelet count 179K  Per office protocol, patient can hold Xarelto for 3 days prior to procedure as requested.

## 2021-06-22 ENCOUNTER — Telehealth: Payer: Self-pay | Admitting: *Deleted

## 2021-06-22 NOTE — Telephone Encounter (Signed)
Pt came in for Pt Assistance Form to fill out for Xarelto and to get some samples since he is unable to afford the medication at this time. Form was given and 2 bottles of Xarelto given

## 2021-06-25 DIAGNOSIS — J019 Acute sinusitis, unspecified: Secondary | ICD-10-CM | POA: Diagnosis not present

## 2021-06-25 DIAGNOSIS — J208 Acute bronchitis due to other specified organisms: Secondary | ICD-10-CM | POA: Diagnosis not present

## 2021-06-25 DIAGNOSIS — B9689 Other specified bacterial agents as the cause of diseases classified elsewhere: Secondary | ICD-10-CM | POA: Diagnosis not present

## 2021-07-13 DIAGNOSIS — D124 Benign neoplasm of descending colon: Secondary | ICD-10-CM | POA: Diagnosis not present

## 2021-07-13 DIAGNOSIS — R972 Elevated prostate specific antigen [PSA]: Secondary | ICD-10-CM | POA: Diagnosis not present

## 2021-07-15 IMAGING — CT CT ANGIO CHEST
2 of 16 series · 15 of 37 positions shown · IV contrast (APPLIED)
Comparison: CTA chest 07/24/2018.

CLINICAL DATA: 73-year-old male with history of severe aortic
stenosis. Preprocedural study prior to potential transcatheter
aortic valve replacement (TAVR) procedure.

EXAM:
CT ANGIOGRAPHY CHEST, ABDOMEN AND PELVIS
TECHNIQUE: Multidetector CT imaging through the chest, abdomen and pelvis was
performed using the standard protocol during bolus administration of
intravenous contrast. Multiplanar reconstructed images and MIPs were
obtained and reviewed to evaluate the vascular anatomy.
CONTRAST:  100mL OMNIPAQUE IOHEXOL 350 MG/ML SOLN

[Series 8: 0-90% · axial · 0.41mm/px · z∈[-333,-184]mm · 7 of 3310 slices shown]
[im 414/3310  lung]
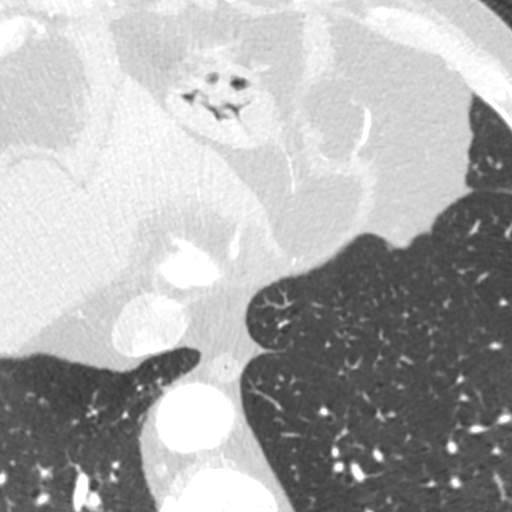
[im 828/3310  lung]
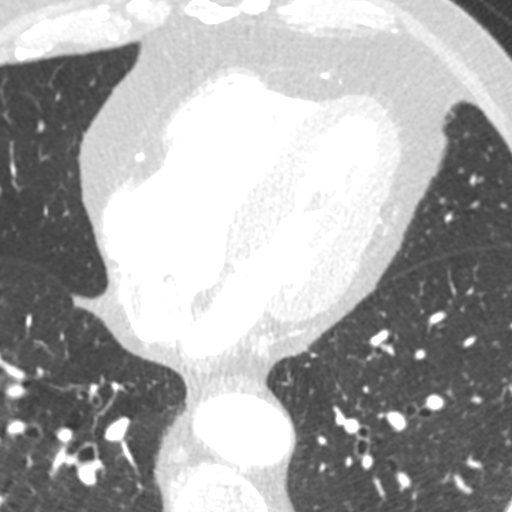
[im 1241/3310  lung]
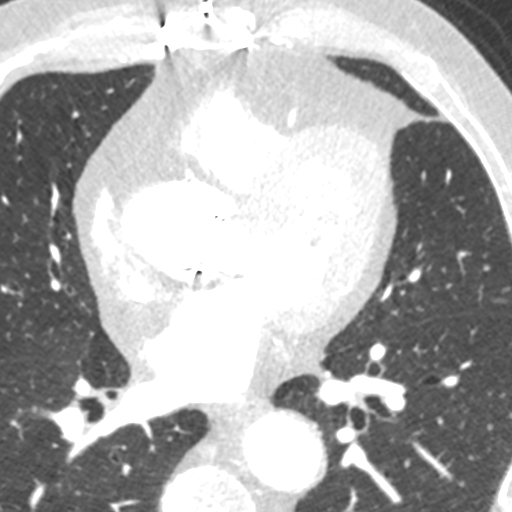
[im 1655/3310  lung]
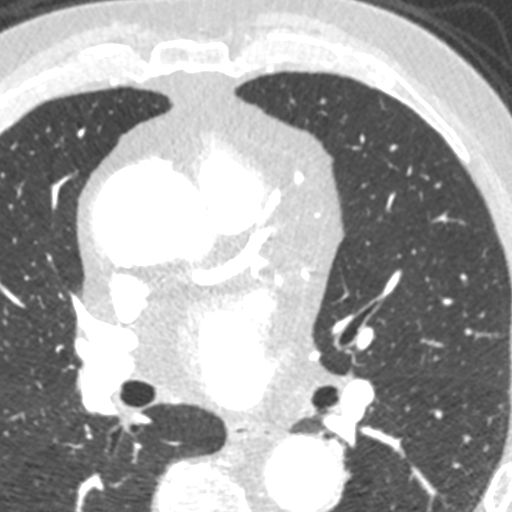
[im 2069/3310  lung]
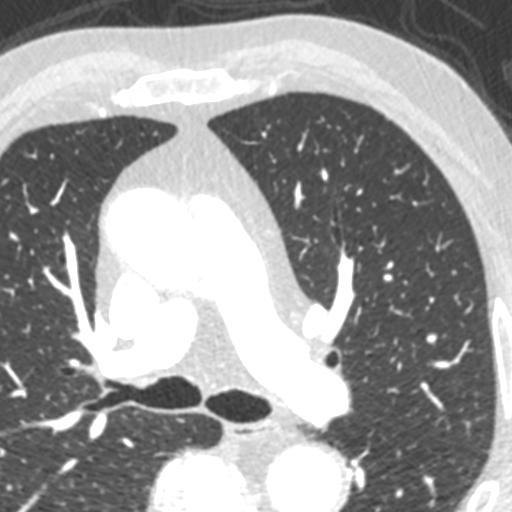
[im 2482/3310  lung]
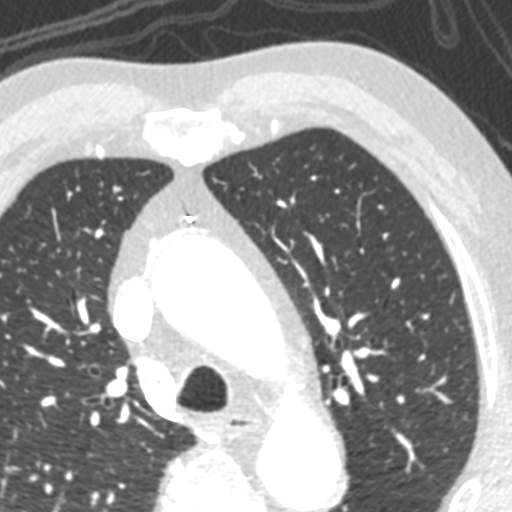
[im 2896/3310  lung]
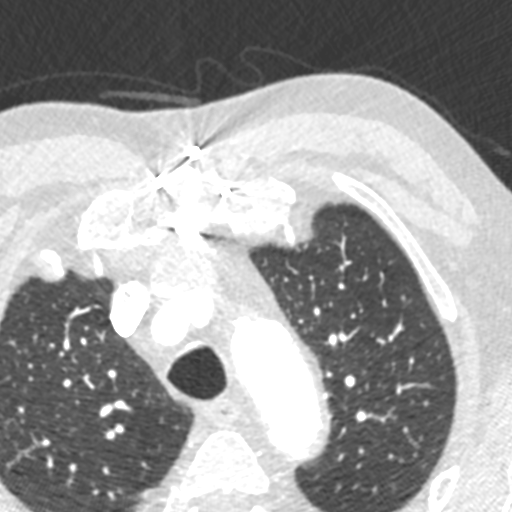

[Series 9: 5-95% · axial · 0.41mm/px · z∈[-336,-181]mm · 8 of 3310 slices shown]
[im 368/3310  lung]
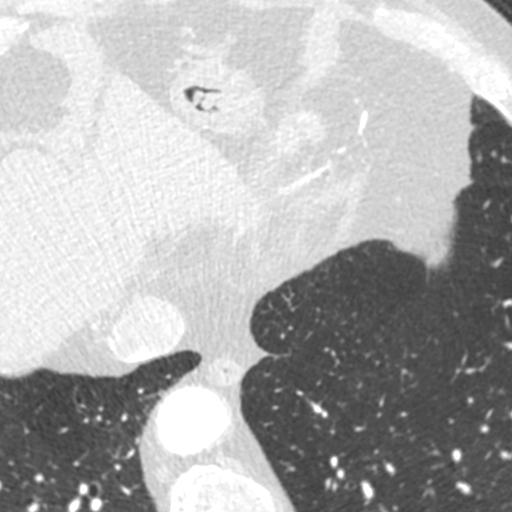
[im 736/3310  mediastinal]
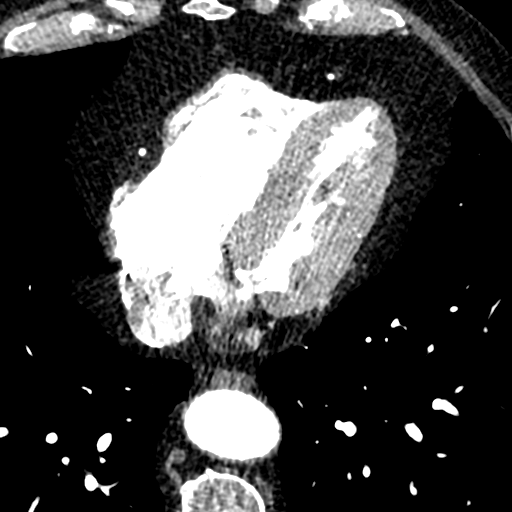
[im 1104/3310  lung]
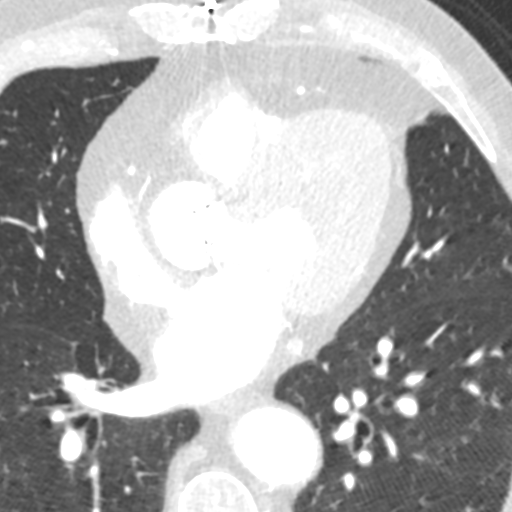
[im 1471/3310  mediastinal]
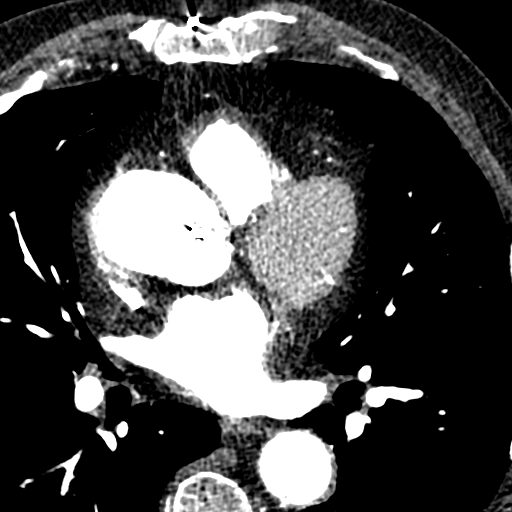
[im 1839/3310  lung]
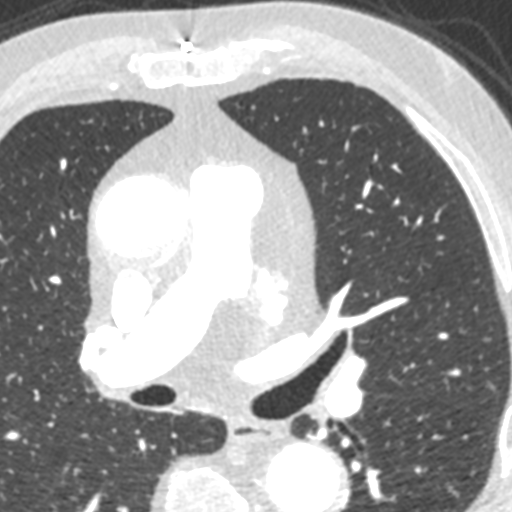
[im 2207/3310  mediastinal]
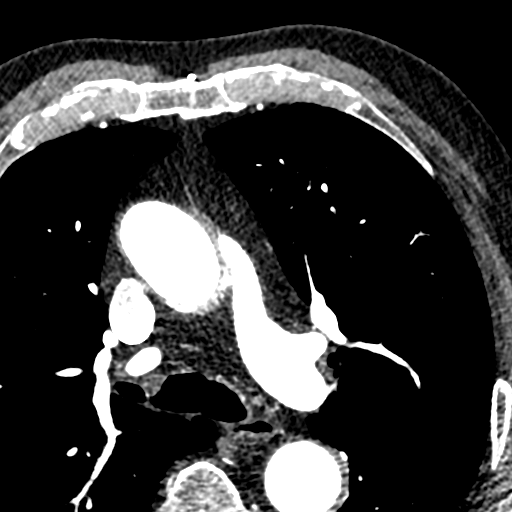
[im 2574/3310  lung]
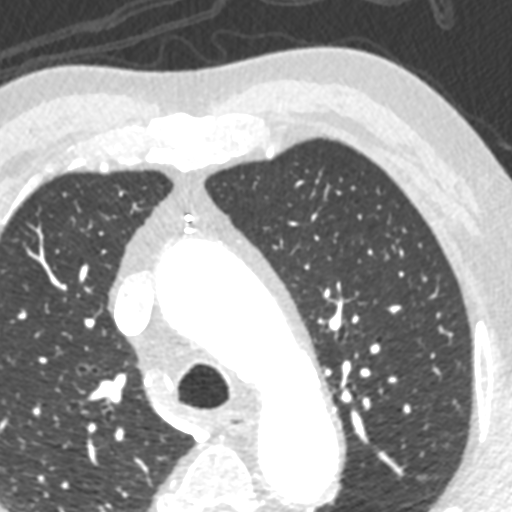
[im 2942/3310  mediastinal]
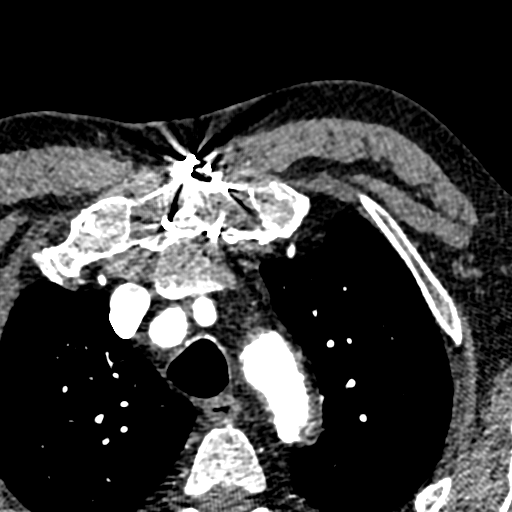

[15 of 37 positions shown; findings below may reference images not displayed]

FINDINGS: CTA CHEST FINDINGS

Cardiovascular: Heart size is normal. There is no significant
pericardial fluid, thickening or pericardial calcification. There is
aortic atherosclerosis, as well as atherosclerosis of the great
vessels of the mediastinum and the coronary arteries, including
calcified atherosclerotic plaque in the left main and left anterior
descending coronary arteries. Status post median sternotomy for
aortic root replacement with a stented bioprosthesis.

Mediastinum/Lymph Nodes: No pathologically enlarged mediastinal or
hilar lymph nodes. Esophagus is unremarkable in appearance. No
axillary lymphadenopathy.

Lungs/Pleura: No suspicious appearing pulmonary nodules or masses
are noted. No acute consolidative airspace disease. No pleural
effusions.

Musculoskeletal/Soft Tissues: Median sternotomy wires. There are no
aggressive appearing lytic or blastic lesions noted in the
visualized portions of the skeleton.

CTA ABDOMEN AND PELVIS FINDINGS

Hepatobiliary: No suspicious cystic or solid hepatic lesions. No
intra or extrahepatic biliary ductal dilatation. Status post
cholecystectomy.

Pancreas: No pancreatic mass. No pancreatic ductal dilatation. No
pancreatic or peripancreatic fluid collections or inflammatory
changes.

Spleen: Unremarkable.

Adrenals/Urinary Tract: Bilateral kidneys and bilateral adrenal
glands are normal in appearance. No hydroureteronephrosis. Urinary
bladder is normal in appearance.

Stomach/Bowel: Normal appearance of the stomach. No pathologic
dilatation of small bowel or colon. Numerous colonic diverticulae
are noted, without surrounding inflammatory changes to suggest an
acute diverticulitis at this time. Normal appendix.

Vascular/Lymphatic: Aortic atherosclerosis, without evidence of
aneurysm or dissection in the abdominal or pelvic vasculature.
Vascular findings and measurements pertinent to potential TAVR
procedure, as detailed below. No lymphadenopathy noted in the
abdomen or pelvis.

Reproductive: Prostate gland and seminal vesicles are unremarkable
in appearance.

Other: No significant volume of ascites.  No pneumoperitoneum.

Musculoskeletal: There are no aggressive appearing lytic or blastic
lesions noted in the visualized portions of the skeleton.

VASCULAR MEASUREMENTS PERTINENT TO TAVR:

AORTA:

Minimal Aortic Miameter-1H x 18 mm

Severity of Aortic Calcification-mild

RIGHT PELVIS:

Right Common Iliac Artery -

Minimal Wiameter-D1.1 x 10.9 mm

Tortuosity-mild

Calcification-mild

Right External Iliac Artery -

Minimal 4iameter-AA.A x 11.5 mm

Tortuosity-moderate

Calcification-minimal

Right Common Femoral Artery -

Minimal Xiameter-K4.K x 10.5 mm

Tortuosity-mild

Calcification-mild

LEFT PELVIS:

Left Common Iliac Artery -

Minimal Tiameter-T8.3 x 10.3 mm

Tortuosity-mild

Calcification-mild

Left External Iliac Artery -

Minimal Tiameter-T8.3 x 12.0 mm

Tortuosity-moderate

Calcification-none

Left Common Femoral Artery -

Minimal Uiameter-LZ.9 x 12.2 mm

Tortuosity-mild

Calcification-mild

Review of the MIP images confirms the above findings.
IMPRESSION: 1. Vascular findings and measurements pertinent to potential TAVR
procedure, as detailed above.
2. Aortic atherosclerosis, in addition to left main and left
anterior descending coronary artery disease.
3. Status post aortic valve replacement with stented bioprosthesis.
4. Colonic diverticulosis without evidence of acute diverticulitis
at this time.
5. Additional incidental findings, as above.

## 2021-07-16 DIAGNOSIS — F331 Major depressive disorder, recurrent, moderate: Secondary | ICD-10-CM | POA: Diagnosis not present

## 2021-07-16 DIAGNOSIS — K219 Gastro-esophageal reflux disease without esophagitis: Secondary | ICD-10-CM | POA: Diagnosis not present

## 2021-07-16 DIAGNOSIS — E785 Hyperlipidemia, unspecified: Secondary | ICD-10-CM | POA: Diagnosis not present

## 2021-07-16 DIAGNOSIS — I10 Essential (primary) hypertension: Secondary | ICD-10-CM | POA: Diagnosis not present

## 2021-07-16 DIAGNOSIS — F5104 Psychophysiologic insomnia: Secondary | ICD-10-CM | POA: Diagnosis not present

## 2021-07-16 DIAGNOSIS — K449 Diaphragmatic hernia without obstruction or gangrene: Secondary | ICD-10-CM | POA: Diagnosis not present

## 2021-07-16 DIAGNOSIS — I480 Paroxysmal atrial fibrillation: Secondary | ICD-10-CM | POA: Diagnosis not present

## 2021-07-16 DIAGNOSIS — R972 Elevated prostate specific antigen [PSA]: Secondary | ICD-10-CM | POA: Diagnosis not present

## 2021-07-16 DIAGNOSIS — Z79899 Other long term (current) drug therapy: Secondary | ICD-10-CM | POA: Diagnosis not present

## 2021-08-25 ENCOUNTER — Other Ambulatory Visit: Payer: Self-pay | Admitting: Cardiology

## 2021-09-08 ENCOUNTER — Other Ambulatory Visit: Payer: Self-pay | Admitting: Cardiology

## 2021-09-08 DIAGNOSIS — R413 Other amnesia: Secondary | ICD-10-CM | POA: Diagnosis not present

## 2021-09-08 NOTE — Telephone Encounter (Signed)
Prescription refill request for Xarelto received.  Indication: Afib  Last office visit:06/16/21 (Revankar)  Weight: 83.7kg Age: 75 Scr: 1.04 (01/12/21)  CrCl: 73.66ml/min  Appropriate dose and refill sent to requested pharmacy.

## 2021-09-21 DIAGNOSIS — R413 Other amnesia: Secondary | ICD-10-CM | POA: Diagnosis not present

## 2021-09-21 DIAGNOSIS — R202 Paresthesia of skin: Secondary | ICD-10-CM | POA: Diagnosis not present

## 2021-09-21 DIAGNOSIS — G319 Degenerative disease of nervous system, unspecified: Secondary | ICD-10-CM | POA: Diagnosis not present

## 2021-09-21 DIAGNOSIS — J3489 Other specified disorders of nose and nasal sinuses: Secondary | ICD-10-CM | POA: Diagnosis not present

## 2021-09-24 DIAGNOSIS — R413 Other amnesia: Secondary | ICD-10-CM | POA: Diagnosis not present

## 2021-09-24 DIAGNOSIS — K118 Other diseases of salivary glands: Secondary | ICD-10-CM | POA: Diagnosis not present

## 2021-10-01 DIAGNOSIS — M5137 Other intervertebral disc degeneration, lumbosacral region: Secondary | ICD-10-CM | POA: Diagnosis not present

## 2021-10-01 DIAGNOSIS — J019 Acute sinusitis, unspecified: Secondary | ICD-10-CM | POA: Diagnosis not present

## 2021-10-01 DIAGNOSIS — J208 Acute bronchitis due to other specified organisms: Secondary | ICD-10-CM | POA: Diagnosis not present

## 2021-10-01 DIAGNOSIS — B9689 Other specified bacterial agents as the cause of diseases classified elsewhere: Secondary | ICD-10-CM | POA: Diagnosis not present

## 2021-10-16 DIAGNOSIS — I10 Essential (primary) hypertension: Secondary | ICD-10-CM | POA: Diagnosis not present

## 2021-10-16 DIAGNOSIS — K582 Mixed irritable bowel syndrome: Secondary | ICD-10-CM | POA: Diagnosis not present

## 2021-10-16 DIAGNOSIS — R413 Other amnesia: Secondary | ICD-10-CM | POA: Diagnosis not present

## 2021-10-16 DIAGNOSIS — R972 Elevated prostate specific antigen [PSA]: Secondary | ICD-10-CM | POA: Diagnosis not present

## 2021-10-16 DIAGNOSIS — Z79899 Other long term (current) drug therapy: Secondary | ICD-10-CM | POA: Diagnosis not present

## 2021-10-16 DIAGNOSIS — K219 Gastro-esophageal reflux disease without esophagitis: Secondary | ICD-10-CM | POA: Diagnosis not present

## 2021-10-16 DIAGNOSIS — F5104 Psychophysiologic insomnia: Secondary | ICD-10-CM | POA: Diagnosis not present

## 2021-10-16 DIAGNOSIS — F331 Major depressive disorder, recurrent, moderate: Secondary | ICD-10-CM | POA: Diagnosis not present

## 2021-10-16 DIAGNOSIS — I480 Paroxysmal atrial fibrillation: Secondary | ICD-10-CM | POA: Diagnosis not present

## 2021-10-16 DIAGNOSIS — Z9181 History of falling: Secondary | ICD-10-CM | POA: Diagnosis not present

## 2021-10-16 DIAGNOSIS — K449 Diaphragmatic hernia without obstruction or gangrene: Secondary | ICD-10-CM | POA: Diagnosis not present

## 2021-10-16 DIAGNOSIS — E785 Hyperlipidemia, unspecified: Secondary | ICD-10-CM | POA: Diagnosis not present

## 2021-10-23 DIAGNOSIS — R972 Elevated prostate specific antigen [PSA]: Secondary | ICD-10-CM | POA: Diagnosis not present

## 2021-10-23 DIAGNOSIS — N401 Enlarged prostate with lower urinary tract symptoms: Secondary | ICD-10-CM | POA: Diagnosis not present

## 2021-10-23 DIAGNOSIS — R3912 Poor urinary stream: Secondary | ICD-10-CM | POA: Diagnosis not present

## 2021-10-29 ENCOUNTER — Other Ambulatory Visit: Payer: Self-pay | Admitting: Cardiology

## 2021-11-02 DIAGNOSIS — R413 Other amnesia: Secondary | ICD-10-CM | POA: Diagnosis not present

## 2021-11-02 DIAGNOSIS — K118 Other diseases of salivary glands: Secondary | ICD-10-CM | POA: Diagnosis not present

## 2021-11-02 DIAGNOSIS — R221 Localized swelling, mass and lump, neck: Secondary | ICD-10-CM | POA: Diagnosis not present

## 2021-11-02 DIAGNOSIS — H9193 Unspecified hearing loss, bilateral: Secondary | ICD-10-CM | POA: Diagnosis not present

## 2021-11-02 DIAGNOSIS — J329 Chronic sinusitis, unspecified: Secondary | ICD-10-CM | POA: Diagnosis not present

## 2021-11-02 DIAGNOSIS — H7012 Chronic mastoiditis, left ear: Secondary | ICD-10-CM | POA: Diagnosis not present

## 2021-11-02 DIAGNOSIS — G319 Degenerative disease of nervous system, unspecified: Secondary | ICD-10-CM | POA: Diagnosis not present

## 2021-11-11 DIAGNOSIS — Z951 Presence of aortocoronary bypass graft: Secondary | ICD-10-CM | POA: Diagnosis not present

## 2021-11-11 DIAGNOSIS — I700 Atherosclerosis of aorta: Secondary | ICD-10-CM | POA: Diagnosis not present

## 2021-11-11 DIAGNOSIS — R599 Enlarged lymph nodes, unspecified: Secondary | ICD-10-CM | POA: Diagnosis not present

## 2021-11-11 DIAGNOSIS — K118 Other diseases of salivary glands: Secondary | ICD-10-CM | POA: Diagnosis not present

## 2021-11-20 DIAGNOSIS — K118 Other diseases of salivary glands: Secondary | ICD-10-CM | POA: Diagnosis not present

## 2021-11-20 DIAGNOSIS — D110 Benign neoplasm of parotid gland: Secondary | ICD-10-CM | POA: Diagnosis not present

## 2022-01-13 ENCOUNTER — Other Ambulatory Visit: Payer: Self-pay

## 2022-01-14 ENCOUNTER — Ambulatory Visit: Payer: PPO | Admitting: Cardiology

## 2022-01-14 ENCOUNTER — Encounter: Payer: Self-pay | Admitting: Cardiology

## 2022-01-14 VITALS — BP 124/68 | HR 72 | Ht 69.0 in | Wt 185.6 lb

## 2022-01-14 DIAGNOSIS — I77810 Thoracic aortic ectasia: Secondary | ICD-10-CM

## 2022-01-14 DIAGNOSIS — K219 Gastro-esophageal reflux disease without esophagitis: Secondary | ICD-10-CM | POA: Diagnosis not present

## 2022-01-14 DIAGNOSIS — I480 Paroxysmal atrial fibrillation: Secondary | ICD-10-CM

## 2022-01-14 DIAGNOSIS — I10 Essential (primary) hypertension: Secondary | ICD-10-CM

## 2022-01-14 DIAGNOSIS — Z9889 Other specified postprocedural states: Secondary | ICD-10-CM

## 2022-01-14 DIAGNOSIS — K449 Diaphragmatic hernia without obstruction or gangrene: Secondary | ICD-10-CM | POA: Diagnosis not present

## 2022-01-14 DIAGNOSIS — E785 Hyperlipidemia, unspecified: Secondary | ICD-10-CM

## 2022-01-14 DIAGNOSIS — R413 Other amnesia: Secondary | ICD-10-CM | POA: Diagnosis not present

## 2022-01-14 DIAGNOSIS — H8103 Meniere's disease, bilateral: Secondary | ICD-10-CM | POA: Diagnosis not present

## 2022-01-14 DIAGNOSIS — F331 Major depressive disorder, recurrent, moderate: Secondary | ICD-10-CM | POA: Diagnosis not present

## 2022-01-14 DIAGNOSIS — Z79899 Other long term (current) drug therapy: Secondary | ICD-10-CM | POA: Diagnosis not present

## 2022-01-14 DIAGNOSIS — Z952 Presence of prosthetic heart valve: Secondary | ICD-10-CM | POA: Diagnosis not present

## 2022-01-14 DIAGNOSIS — M5137 Other intervertebral disc degeneration, lumbosacral region: Secondary | ICD-10-CM | POA: Diagnosis not present

## 2022-01-14 DIAGNOSIS — R972 Elevated prostate specific antigen [PSA]: Secondary | ICD-10-CM | POA: Diagnosis not present

## 2022-01-14 DIAGNOSIS — F5104 Psychophysiologic insomnia: Secondary | ICD-10-CM | POA: Diagnosis not present

## 2022-01-14 NOTE — Progress Notes (Signed)
ekg 

## 2022-01-14 NOTE — Patient Instructions (Signed)

## 2022-01-14 NOTE — Progress Notes (Signed)
?Cardiology Office Note:   ? ?Date:  01/14/2022  ? ?ID:  Randall Edwards, DOB [birth year], MRN [id number] ? ?PCP:  Nicoletta Dress, MD  ?Cardiologist:  Jenean Lindau, MD  ? ?Referring MD: Nicoletta Dress, MD  ? ? ?ASSESSMENT:   ? ?1. PAF (paroxysmal atrial fibrillation) ([place])   ?2. Essential hypertension   ?3. Ascending aorta dilatation ([facility])   ?4. Dyslipidemia   ?5. S/P AVR (aortic valve replacement)   ?6. s/p valve-in-valve TAVR   ?7. S/P aorta repair   ? ?PLAN:   ? ?In order of problems listed above: ? ?Primary prevention stressed with the patient.  Importance of compliance with diet medication stressed any vocalized understanding. ?Post TAVR valve in the prosthetic aortic valve which was stenosed: Stable at this time.  He is asymptomatic.  He denies any history of chest pain syncope or orthopnea or any such issues. ?Essential hypertension: Blood pressure stable and diet was emphasized. ?Paroxysmal atrial fibrillation:I discussed with the patient atrial fibrillation, disease process. Management and therapy including rate and rhythm control, anticoagulation benefits and potential risks were discussed extensively with the patient. Patient had multiple questions which were answered to patient's satisfaction. ?Mixed dyslipidemia: On statin therapy and followed by primary care.  He had blood work done today.  Blood work from February was fine.  I discussed this with him. ?Patient will be seen in follow-up appointment in 6 months or earlier if the patient has any concerns ? ? ? ?Medication Adjustments/Labs and Tests Ordered: ?Current medicines are reviewed at length with the patient today.  Concerns regarding medicines are outlined above.  ?Orders Placed This Encounter  ?Procedures  ? EKG 12-Lead  ? ?No orders of the defined types were placed in this encounter. ? ? ? ?Chief Complaint  ?Patient presents with  ? Follow-up  ?  ? ?History of Present Illness:   ? ?Randall Edwards is a 76 y.o. male.  Patient has past  medical history of essential hypertension, TAVR valve which was placed for a malfunctioning stenotic prosthetic aortic valve.  He has history of paroxysmal atrial fibrillation.  Patient denies any problems at this time and takes care of activities of daily living.  No chest pain orthopnea or PND.  His wife mentions to me that he is now on Aricept for forgetfulness and what looks like early dementia.  At the time of my evaluation, the patient is alert awake oriented and in no distress. ? ?Past Medical History:  ?Diagnosis Date  ? Aortic atherosclerosis ([place]) 06/18/2021  ? Noted on 2020 CTA  ? Ascending aorta dilatation ([place]) 05/16/2019  ? Atrial tachycardia ([place])   ? s/p ablation and on sotolol  ? BPH (benign prostatic hyperplasia)   ? Chronic frontal sinusitis 09/12/2014  ? Chronic sphenoidal sinusitis 09/12/2014  ? Dyslipidemia 03/18/2015  ? Essential hypertension 03/18/2015  ? GERD (gastroesophageal reflux disease)   ? H/O cardiac radiofrequency ablation 08/2016  ? Hyperlipidemia   ? Hypertension   ? Hypertrophy of both inferior nasal turbinates 09/12/2014  ? Multiple nasal polyps 09/12/2014  ? Nasal septal deviation 09/12/2014  ? PAF (paroxysmal atrial fibrillation) ([place]) 12/17/2020  ? S/P aorta repair   ? S/P AVR (aortic valve replacement) 2010  ? s/p valve-in-valve TAVR 07/17/2019  ? s/p VIV TAVR via the TA approach after failed TF case 07/03/19  ? Severe aortic stenosis 05/16/2019  ? Status post radiofrequency ablation for arrhythmia 03/18/2015  ? Vertigo   ? ? ?Past Surgical History:  ?  Procedure Laterality Date  ? ABLATION OF DYSRHYTHMIC FOCUS    ? CARDIAC VALVE REPLACEMENT    ? CHOLECYSTECTOMY    ? KIDNEY STONE SURGERY    ? KNEE ARTHROSCOPY Bilateral   ? MASS EXCISION Right 12/26/2017  ? Procedure: RIGHT INDEX EXCISION CYST AND DEBRIDEMENT OF DISTAL INTERPHALANGEAL JOINT;  Surgeon: Leanora Cover, MD;  Location: [facility];  Service: Orthopedics;  Laterality: Right;  Bier block  ? RIGHT HEART CATH AND  CORONARY ANGIOGRAPHY N/A 06/20/2019  ? Procedure: RIGHT HEART CATH AND CORONARY ANGIOGRAPHY;  Surgeon: Sherren Mocha, MD;  Location: [facility];  Service: Cardiovascular;  Laterality: N/A;  ? TEE WITHOUT CARDIOVERSION N/A 07/03/2019  ? Procedure: TRANSESOPHAGEAL ECHOCARDIOGRAM (TEE);  Surgeon: Sherren Mocha, MD;  Location: [facility];  Service: Open Heart Surgery;  Laterality: N/A;  ? TEE WITHOUT CARDIOVERSION N/A 07/17/2019  ? Procedure: TRANSESOPHAGEAL ECHOCARDIOGRAM (TEE);  Surgeon: Sherren Mocha, MD;  Location: [facility];  Service: Open Heart Surgery;  Laterality: N/A;  ? TRANSCATHETER AORTIC VALVE REPLACEMENT, TRANSAPICAL N/A 07/17/2019  ? Procedure: TRANSCATHETER AORTIC VALVE REPLACEMENT, TRANSAPICAL;  Surgeon: Sherren Mocha, MD;  Location: [facility];  Service: Open Heart Surgery;  Laterality: N/A;  ? TRANSCATHETER AORTIC VALVE REPLACEMENT, TRANSFEMORAL N/A 07/03/2019  ? Procedure: attempted TRANSCATHETER AORTIC VALVE REPLACEMENT, TRANSFEMORAL;  Surgeon: Sherren Mocha, MD;  Location: [facility];  Service: Open Heart Surgery;  Laterality: N/A;  ? ? ?Current Medications: ?Current Meds  ?Medication Sig  ? DULoxetine (CYMBALTA) 60 MG capsule Take 60 mg by mouth daily.  ? fexofenadine (ALLEGRA) 180 MG tablet Take 180 mg by mouth 2 (two) times daily.   ? ibuprofen (ADVIL) 800 MG tablet Take 1,600 mg by mouth every 8 (eight) hours as needed for pain.  ? meclizine (ANTIVERT) 25 MG tablet Take 25 mg by mouth 3 (three) times daily as needed for dizziness.  ? omeprazole (PRILOSEC) 20 MG capsule Take 20 mg by mouth daily.  ? pantoprazole (PROTONIX) 40 MG tablet Take 40 mg by mouth daily.  ? potassium chloride (KLOR-CON) 10 MEQ tablet Take 10 mEq by mouth daily.  ? rosuvastatin (CRESTOR) 10 MG tablet TAKE ONE TABLET BY MOUTH EVERY DAY IN THE EVENING (Patient taking differently: Take 10 mg by mouth daily.)  ? sotalol (BETAPACE) 80 MG tablet Take 1 tablet (80 mg total) by mouth 2 (two) times daily.  ? tamsulosin (FLOMAX) 0.4 MG  CAPS capsule Take 0.4 mg by mouth daily after supper.  ? temazepam (RESTORIL) 15 MG capsule Take 15 mg by mouth at bedtime.   ? traMADol (ULTRAM) 50 MG tablet Take 50 mg by mouth every 6 (six) hours as needed for pain (back pain).  ? triamterene-hydrochlorothiazide (MAXZIDE-25) 37.5-25 MG tablet TAKE ONE TABLET BY MOUTH EVERY DAY (Patient taking differently: Take 1 tablet by mouth daily.)  ? XARELTO 20 MG TABS tablet Take 1 tablet (20 mg total) by mouth daily with supper. (Patient taking differently: Take 20 mg by mouth daily with supper.)  ?  ? ?Allergies:   Patient has no known allergies.  ? ?Social History  ? ?Socioeconomic History  ? Marital status: Married  ?  Spouse name: Not on file  ? Number of children: Not on file  ? Years of education: Not on file  ? Highest education level: Not on file  ?Occupational History  ? Not on file  ?Tobacco Use  ? Smoking status: Never  ? Smokeless tobacco: Never  ?Vaping Use  ? Vaping Use: Never used  ?  Substance and Sexual Activity  ? Alcohol use: No  ? Drug use: No  ? Sexual activity: Not on file  ?Other Topics Concern  ? Not on file  ?Social History Narrative  ? Not on file  ? ?Social Determinants of Health  ? ?Financial Resource Strain: Not on file  ?Food Insecurity: Not on file  ?Transportation Needs: Not on file  ?Physical Activity: Not on file  ?Stress: Not on file  ?Social Connections: Not on file  ?  ? ?Family History: ?The patient's family history includes CAD in his brother. ? ?ROS:   ?Please see the history of present illness.    ?All other systems reviewed and are negative. ? ?EKGs/Labs/Other Studies Reviewed:   ? ?The following studies were reviewed today: ?EKG reveals sinus rhythm and nonspecific ST-T changes ? ? ?Recent Labs: ?No results found for requested labs within last 8760 hours.  ?Recent Lipid Panel ?No results found for: CHOL, TRIG, HDL, CHOLHDL, VLDL, LDLCALC, LDLDIRECT ? ?Physical Exam:   ? ?VS:  BP 124/68 (BP Location: Left Arm)   Pulse 72   Ht '5\' 9"'$   (1.753 m)   Wt 185 lb 9.6 oz (84.2 kg)   SpO2 95%   BMI 27.41 kg/m?    ? ?Wt Readings from Last 3 Encounters:  ?01/14/22 185 lb 9.6 oz (84.2 kg)  ?06/16/21 184 lb 9.6 oz (83.7 kg)  ?12/17/20 183 lb 12.8 oz (83.4

## 2022-01-18 DIAGNOSIS — M5137 Other intervertebral disc degeneration, lumbosacral region: Secondary | ICD-10-CM | POA: Diagnosis not present

## 2022-01-18 DIAGNOSIS — M47816 Spondylosis without myelopathy or radiculopathy, lumbar region: Secondary | ICD-10-CM | POA: Diagnosis not present

## 2022-01-18 DIAGNOSIS — M5126 Other intervertebral disc displacement, lumbar region: Secondary | ICD-10-CM | POA: Diagnosis not present

## 2022-01-18 DIAGNOSIS — M48061 Spinal stenosis, lumbar region without neurogenic claudication: Secondary | ICD-10-CM | POA: Diagnosis not present

## 2022-02-02 DIAGNOSIS — R42 Dizziness and giddiness: Secondary | ICD-10-CM | POA: Diagnosis not present

## 2022-02-02 DIAGNOSIS — H8102 Meniere's disease, left ear: Secondary | ICD-10-CM | POA: Diagnosis not present

## 2022-02-02 DIAGNOSIS — H9193 Unspecified hearing loss, bilateral: Secondary | ICD-10-CM | POA: Diagnosis not present

## 2022-02-11 DIAGNOSIS — M545 Low back pain, unspecified: Secondary | ICD-10-CM | POA: Diagnosis not present

## 2022-02-11 DIAGNOSIS — M5441 Lumbago with sciatica, right side: Secondary | ICD-10-CM | POA: Diagnosis not present

## 2022-02-11 DIAGNOSIS — M5442 Lumbago with sciatica, left side: Secondary | ICD-10-CM | POA: Diagnosis not present

## 2022-02-12 DIAGNOSIS — R42 Dizziness and giddiness: Secondary | ICD-10-CM | POA: Diagnosis not present

## 2022-02-12 DIAGNOSIS — H8102 Meniere's disease, left ear: Secondary | ICD-10-CM | POA: Diagnosis not present

## 2022-02-12 DIAGNOSIS — M47816 Spondylosis without myelopathy or radiculopathy, lumbar region: Secondary | ICD-10-CM | POA: Diagnosis not present

## 2022-02-20 DIAGNOSIS — H9190 Unspecified hearing loss, unspecified ear: Secondary | ICD-10-CM | POA: Diagnosis not present

## 2022-02-20 DIAGNOSIS — H7292 Unspecified perforation of tympanic membrane, left ear: Secondary | ICD-10-CM | POA: Diagnosis not present

## 2022-02-22 ENCOUNTER — Other Ambulatory Visit: Payer: Self-pay | Admitting: Cardiology

## 2022-02-22 DIAGNOSIS — H9222 Otorrhagia, left ear: Secondary | ICD-10-CM | POA: Diagnosis not present

## 2022-02-22 DIAGNOSIS — H9193 Unspecified hearing loss, bilateral: Secondary | ICD-10-CM | POA: Diagnosis not present

## 2022-03-01 DIAGNOSIS — H9193 Unspecified hearing loss, bilateral: Secondary | ICD-10-CM | POA: Diagnosis not present

## 2022-03-01 DIAGNOSIS — Z974 Presence of external hearing-aid: Secondary | ICD-10-CM | POA: Diagnosis not present

## 2022-03-01 DIAGNOSIS — S00412D Abrasion of left ear, subsequent encounter: Secondary | ICD-10-CM | POA: Diagnosis not present

## 2022-03-02 DIAGNOSIS — M47816 Spondylosis without myelopathy or radiculopathy, lumbar region: Secondary | ICD-10-CM | POA: Diagnosis not present

## 2022-03-08 ENCOUNTER — Other Ambulatory Visit: Payer: Self-pay | Admitting: Physician Assistant

## 2022-03-08 ENCOUNTER — Other Ambulatory Visit: Payer: Self-pay | Admitting: Cardiology

## 2022-03-08 DIAGNOSIS — H9193 Unspecified hearing loss, bilateral: Secondary | ICD-10-CM | POA: Diagnosis not present

## 2022-03-08 DIAGNOSIS — S00412D Abrasion of left ear, subsequent encounter: Secondary | ICD-10-CM | POA: Diagnosis not present

## 2022-03-08 DIAGNOSIS — Z974 Presence of external hearing-aid: Secondary | ICD-10-CM | POA: Diagnosis not present

## 2022-03-12 DIAGNOSIS — H2513 Age-related nuclear cataract, bilateral: Secondary | ICD-10-CM | POA: Diagnosis not present

## 2022-03-12 DIAGNOSIS — Z01818 Encounter for other preprocedural examination: Secondary | ICD-10-CM | POA: Diagnosis not present

## 2022-03-12 DIAGNOSIS — H2511 Age-related nuclear cataract, right eye: Secondary | ICD-10-CM | POA: Diagnosis not present

## 2022-03-22 ENCOUNTER — Other Ambulatory Visit: Payer: Self-pay | Admitting: Physician Assistant

## 2022-03-23 DIAGNOSIS — J449 Chronic obstructive pulmonary disease, unspecified: Secondary | ICD-10-CM | POA: Diagnosis not present

## 2022-03-23 DIAGNOSIS — I10 Essential (primary) hypertension: Secondary | ICD-10-CM | POA: Diagnosis not present

## 2022-03-23 DIAGNOSIS — Z954 Presence of other heart-valve replacement: Secondary | ICD-10-CM | POA: Diagnosis not present

## 2022-03-23 DIAGNOSIS — H31091 Other chorioretinal scars, right eye: Secondary | ICD-10-CM | POA: Diagnosis not present

## 2022-03-23 DIAGNOSIS — H25811 Combined forms of age-related cataract, right eye: Secondary | ICD-10-CM | POA: Diagnosis not present

## 2022-03-23 DIAGNOSIS — H25013 Cortical age-related cataract, bilateral: Secondary | ICD-10-CM | POA: Diagnosis not present

## 2022-03-23 DIAGNOSIS — H2511 Age-related nuclear cataract, right eye: Secondary | ICD-10-CM | POA: Diagnosis not present

## 2022-03-23 DIAGNOSIS — H259 Unspecified age-related cataract: Secondary | ICD-10-CM | POA: Diagnosis not present

## 2022-03-23 DIAGNOSIS — H52221 Regular astigmatism, right eye: Secondary | ICD-10-CM | POA: Diagnosis not present

## 2022-03-23 DIAGNOSIS — Z7901 Long term (current) use of anticoagulants: Secondary | ICD-10-CM | POA: Diagnosis not present

## 2022-03-23 DIAGNOSIS — Z87828 Personal history of other (healed) physical injury and trauma: Secondary | ICD-10-CM | POA: Diagnosis not present

## 2022-03-23 DIAGNOSIS — Z9981 Dependence on supplemental oxygen: Secondary | ICD-10-CM | POA: Diagnosis not present

## 2022-03-23 DIAGNOSIS — I4891 Unspecified atrial fibrillation: Secondary | ICD-10-CM | POA: Diagnosis not present

## 2022-03-29 DIAGNOSIS — M47816 Spondylosis without myelopathy or radiculopathy, lumbar region: Secondary | ICD-10-CM | POA: Diagnosis not present

## 2022-04-09 DIAGNOSIS — L703 Acne tropica: Secondary | ICD-10-CM | POA: Diagnosis not present

## 2022-04-09 DIAGNOSIS — L700 Acne vulgaris: Secondary | ICD-10-CM | POA: Diagnosis not present

## 2022-04-14 ENCOUNTER — Other Ambulatory Visit: Payer: Self-pay | Admitting: Cardiology

## 2022-04-14 DIAGNOSIS — I480 Paroxysmal atrial fibrillation: Secondary | ICD-10-CM

## 2022-04-14 NOTE — Telephone Encounter (Signed)
Prescription refill request for Xarelto received.  Indication:Afib  Last office visit:01/14/22 (Revankar)  Weight: 84.2kg Age: 76 Scr: 1.08 (01/14/22) CrCl: 70.62m/min  Appropriate dose and refill sent to requested pharmacy.

## 2022-04-15 DIAGNOSIS — M47816 Spondylosis without myelopathy or radiculopathy, lumbar region: Secondary | ICD-10-CM | POA: Diagnosis not present

## 2022-04-16 ENCOUNTER — Telehealth: Payer: Self-pay | Admitting: Cardiology

## 2022-04-16 ENCOUNTER — Other Ambulatory Visit: Payer: Self-pay

## 2022-04-16 DIAGNOSIS — I480 Paroxysmal atrial fibrillation: Secondary | ICD-10-CM

## 2022-04-16 MED ORDER — RIVAROXABAN 20 MG PO TABS: 20.0000 mg | ORAL_TABLET | Freq: Every day | ORAL | 5 refills | Status: DC

## 2022-04-16 NOTE — Telephone Encounter (Signed)
Patient calling the office for samples of medication:   1.  What medication and dosage are you requesting samples for? Xarelto  2.  Are you currently out of this medication? Just a few left

## 2022-04-16 NOTE — Telephone Encounter (Signed)
Prescription refill request for Xarelto received.  Indication:Afib Last office visit:5/23 Weight:84.2 kg Age:76 Scr:1.0 CrCl:76.01 ml/min  Prescription refilled

## 2022-04-19 ENCOUNTER — Telehealth: Payer: Self-pay | Admitting: Cardiology

## 2022-04-19 DIAGNOSIS — E785 Hyperlipidemia, unspecified: Secondary | ICD-10-CM | POA: Diagnosis not present

## 2022-04-19 DIAGNOSIS — I480 Paroxysmal atrial fibrillation: Secondary | ICD-10-CM | POA: Diagnosis not present

## 2022-04-19 DIAGNOSIS — R972 Elevated prostate specific antigen [PSA]: Secondary | ICD-10-CM | POA: Diagnosis not present

## 2022-04-19 DIAGNOSIS — K449 Diaphragmatic hernia without obstruction or gangrene: Secondary | ICD-10-CM | POA: Diagnosis not present

## 2022-04-19 DIAGNOSIS — F5104 Psychophysiologic insomnia: Secondary | ICD-10-CM | POA: Diagnosis not present

## 2022-04-19 DIAGNOSIS — H8102 Meniere's disease, left ear: Secondary | ICD-10-CM | POA: Diagnosis not present

## 2022-04-19 DIAGNOSIS — I10 Essential (primary) hypertension: Secondary | ICD-10-CM | POA: Diagnosis not present

## 2022-04-19 DIAGNOSIS — M5137 Other intervertebral disc degeneration, lumbosacral region: Secondary | ICD-10-CM | POA: Diagnosis not present

## 2022-04-19 DIAGNOSIS — R413 Other amnesia: Secondary | ICD-10-CM | POA: Diagnosis not present

## 2022-04-19 DIAGNOSIS — Z79899 Other long term (current) drug therapy: Secondary | ICD-10-CM | POA: Diagnosis not present

## 2022-04-19 DIAGNOSIS — K219 Gastro-esophageal reflux disease without esophagitis: Secondary | ICD-10-CM | POA: Diagnosis not present

## 2022-04-19 NOTE — Telephone Encounter (Signed)
Sample request for Xarelto received.  Indication: Afib  Last office visit: 01/14/22 (Revankar) Weight: 84.2kg Age: 76 Scr: 1.08 (01/14/22) CrCl: 70.18m/min  Called pt concerning the need for samples. Pt is on Xarelto '20mg'$  daily. Pt stated he needs samples because he is now in the donut hole and Xarelto is now to costly.   Will route to [facility] office for samples and also patient advocate nurse to assess need for assistance with cost of medication.

## 2022-04-19 NOTE — Telephone Encounter (Signed)
Per Jacobo Forest, RN, pt can pick up 2 weeks of samples at [facility] office.   Called pt and made him aware. Pt stated he would come in the morning to pick up samples.

## 2022-04-19 NOTE — Telephone Encounter (Signed)
Patient states he needs eliquis samples if possible, please call  [phone] or [phone]

## 2022-04-26 NOTE — Telephone Encounter (Signed)
[phone]). Phone number gave to pt for [employer].

## 2022-04-28 DIAGNOSIS — M47816 Spondylosis without myelopathy or radiculopathy, lumbar region: Secondary | ICD-10-CM | POA: Diagnosis not present

## 2022-04-30 DIAGNOSIS — R3912 Poor urinary stream: Secondary | ICD-10-CM | POA: Diagnosis not present

## 2022-04-30 DIAGNOSIS — R972 Elevated prostate specific antigen [PSA]: Secondary | ICD-10-CM | POA: Diagnosis not present

## 2022-04-30 DIAGNOSIS — N401 Enlarged prostate with lower urinary tract symptoms: Secondary | ICD-10-CM | POA: Diagnosis not present

## 2022-05-04 DIAGNOSIS — H2512 Age-related nuclear cataract, left eye: Secondary | ICD-10-CM | POA: Diagnosis not present

## 2022-05-04 DIAGNOSIS — I2510 Atherosclerotic heart disease of native coronary artery without angina pectoris: Secondary | ICD-10-CM | POA: Diagnosis not present

## 2022-05-04 DIAGNOSIS — H52223 Regular astigmatism, bilateral: Secondary | ICD-10-CM | POA: Diagnosis not present

## 2022-05-04 DIAGNOSIS — Z954 Presence of other heart-valve replacement: Secondary | ICD-10-CM | POA: Diagnosis not present

## 2022-05-04 DIAGNOSIS — I10 Essential (primary) hypertension: Secondary | ICD-10-CM | POA: Diagnosis not present

## 2022-05-04 DIAGNOSIS — H259 Unspecified age-related cataract: Secondary | ICD-10-CM | POA: Diagnosis not present

## 2022-05-04 DIAGNOSIS — H25812 Combined forms of age-related cataract, left eye: Secondary | ICD-10-CM | POA: Diagnosis not present

## 2022-05-04 DIAGNOSIS — Z7901 Long term (current) use of anticoagulants: Secondary | ICD-10-CM | POA: Diagnosis not present

## 2022-05-14 DIAGNOSIS — M47816 Spondylosis without myelopathy or radiculopathy, lumbar region: Secondary | ICD-10-CM | POA: Diagnosis not present

## 2022-05-26 ENCOUNTER — Telehealth: Payer: Self-pay | Admitting: *Deleted

## 2022-05-26 NOTE — Patient Outreach (Signed)
  Care Coordination   Initial Visit Note   05/26/2022 Name: Randall Edwards MRN: [id number] DOB: [birth year]  Randall Edwards is a 76 y.o. year old male who sees Nicoletta Dress, MD for primary care. I spoke with wife of  Randall Edwards by phone today.  What matters to the patients health and wellness today?  Declines Care Coordination services at this time.    Goals Addressed   None     SDOH assessments and interventions completed:  No     Care Coordination Interventions Activated:  No  Care Coordination Interventions:  No, not indicated   Follow up plan: No further intervention required.   Encounter Outcome:  Pt. Refused

## 2022-06-11 DIAGNOSIS — L703 Acne tropica: Secondary | ICD-10-CM | POA: Diagnosis not present

## 2022-06-17 DIAGNOSIS — M47816 Spondylosis without myelopathy or radiculopathy, lumbar region: Secondary | ICD-10-CM | POA: Diagnosis not present

## 2022-06-25 DIAGNOSIS — Z952 Presence of prosthetic heart valve: Secondary | ICD-10-CM | POA: Diagnosis not present

## 2022-06-25 DIAGNOSIS — Z79899 Other long term (current) drug therapy: Secondary | ICD-10-CM | POA: Diagnosis not present

## 2022-06-25 DIAGNOSIS — R4781 Slurred speech: Secondary | ICD-10-CM | POA: Diagnosis not present

## 2022-06-25 DIAGNOSIS — R4701 Aphasia: Secondary | ICD-10-CM | POA: Diagnosis not present

## 2022-06-25 DIAGNOSIS — I10 Essential (primary) hypertension: Secondary | ICD-10-CM | POA: Diagnosis not present

## 2022-06-25 DIAGNOSIS — I440 Atrioventricular block, first degree: Secondary | ICD-10-CM | POA: Diagnosis not present

## 2022-06-25 DIAGNOSIS — E876 Hypokalemia: Secondary | ICD-10-CM | POA: Diagnosis not present

## 2022-06-25 DIAGNOSIS — R531 Weakness: Secondary | ICD-10-CM | POA: Diagnosis not present

## 2022-06-25 DIAGNOSIS — I447 Left bundle-branch block, unspecified: Secondary | ICD-10-CM | POA: Diagnosis not present

## 2022-06-25 DIAGNOSIS — Z951 Presence of aortocoronary bypass graft: Secondary | ICD-10-CM | POA: Diagnosis not present

## 2022-06-25 DIAGNOSIS — F32A Depression, unspecified: Secondary | ICD-10-CM | POA: Diagnosis not present

## 2022-06-25 DIAGNOSIS — Z9889 Other specified postprocedural states: Secondary | ICD-10-CM | POA: Diagnosis not present

## 2022-06-25 DIAGNOSIS — E785 Hyperlipidemia, unspecified: Secondary | ICD-10-CM | POA: Diagnosis not present

## 2022-06-25 DIAGNOSIS — W19XXXA Unspecified fall, initial encounter: Secondary | ICD-10-CM | POA: Diagnosis not present

## 2022-06-25 DIAGNOSIS — N400 Enlarged prostate without lower urinary tract symptoms: Secondary | ICD-10-CM | POA: Diagnosis not present

## 2022-06-25 DIAGNOSIS — K219 Gastro-esophageal reflux disease without esophagitis: Secondary | ICD-10-CM | POA: Diagnosis not present

## 2022-06-25 DIAGNOSIS — I6523 Occlusion and stenosis of bilateral carotid arteries: Secondary | ICD-10-CM | POA: Diagnosis not present

## 2022-06-25 DIAGNOSIS — Z7901 Long term (current) use of anticoagulants: Secondary | ICD-10-CM | POA: Diagnosis not present

## 2022-06-25 DIAGNOSIS — Z7982 Long term (current) use of aspirin: Secondary | ICD-10-CM | POA: Diagnosis not present

## 2022-06-25 DIAGNOSIS — R270 Ataxia, unspecified: Secondary | ICD-10-CM | POA: Diagnosis not present

## 2022-06-26 DIAGNOSIS — I440 Atrioventricular block, first degree: Secondary | ICD-10-CM | POA: Diagnosis not present

## 2022-06-26 DIAGNOSIS — R4701 Aphasia: Secondary | ICD-10-CM | POA: Diagnosis not present

## 2022-06-26 DIAGNOSIS — I340 Nonrheumatic mitral (valve) insufficiency: Secondary | ICD-10-CM | POA: Diagnosis not present

## 2022-06-26 DIAGNOSIS — R4781 Slurred speech: Secondary | ICD-10-CM | POA: Diagnosis not present

## 2022-06-26 DIAGNOSIS — G9389 Other specified disorders of brain: Secondary | ICD-10-CM | POA: Diagnosis not present

## 2022-06-26 DIAGNOSIS — R270 Ataxia, unspecified: Secondary | ICD-10-CM | POA: Diagnosis not present

## 2022-06-26 DIAGNOSIS — G319 Degenerative disease of nervous system, unspecified: Secondary | ICD-10-CM | POA: Diagnosis not present

## 2022-06-26 DIAGNOSIS — E876 Hypokalemia: Secondary | ICD-10-CM | POA: Diagnosis not present

## 2022-06-26 DIAGNOSIS — R443 Hallucinations, unspecified: Secondary | ICD-10-CM | POA: Diagnosis not present

## 2022-06-28 DIAGNOSIS — R4701 Aphasia: Secondary | ICD-10-CM | POA: Diagnosis not present

## 2022-06-28 DIAGNOSIS — R270 Ataxia, unspecified: Secondary | ICD-10-CM | POA: Diagnosis not present

## 2022-06-28 DIAGNOSIS — I10 Essential (primary) hypertension: Secondary | ICD-10-CM | POA: Diagnosis not present

## 2022-06-28 DIAGNOSIS — I480 Paroxysmal atrial fibrillation: Secondary | ICD-10-CM | POA: Diagnosis not present

## 2022-07-01 ENCOUNTER — Encounter: Payer: Self-pay | Admitting: *Deleted

## 2022-07-02 DIAGNOSIS — Z8673 Personal history of transient ischemic attack (TIA), and cerebral infarction without residual deficits: Secondary | ICD-10-CM | POA: Diagnosis not present

## 2022-07-02 DIAGNOSIS — Z09 Encounter for follow-up examination after completed treatment for conditions other than malignant neoplasm: Secondary | ICD-10-CM | POA: Diagnosis not present

## 2022-07-06 ENCOUNTER — Ambulatory Visit: Payer: PPO | Admitting: Psychiatry

## 2022-07-06 ENCOUNTER — Telehealth: Payer: Self-pay

## 2022-07-06 VITALS — BP 118/75 | HR 81 | Ht 69.0 in | Wt 191.2 lb

## 2022-07-06 DIAGNOSIS — R2689 Other abnormalities of gait and mobility: Secondary | ICD-10-CM

## 2022-07-06 DIAGNOSIS — R404 Transient alteration of awareness: Secondary | ICD-10-CM | POA: Diagnosis not present

## 2022-07-06 NOTE — Progress Notes (Signed)
[facility]  PATIENT: Randall Edwards DOB: 12-15-1945  REFERRING CLINICIAN: Nicoletta Dress, MD HISTORY FROM: self, son, wife REASON FOR VISIT: hallucinations, aphasia, gait issues   HISTORICAL  CHIEF COMPLAINT:  Chief Complaint  Patient presents with   New Patient (Initial Visit)    Pt reports feeling pretty good. His wife states that he sleeps a lot. He wants to feel normal again. Room 1 with wife and son    HISTORY OF PRESENT ILLNESS:  The patient presents for evaluation of hallucinations, aphasia, and gait issues. On October 13, he went to breakfast with his wife and was confused afterwards. Asked his wife who the stranger was at their table during breakfast, when there was no stranger there. Took a nap and when he woke up, his speech was slurred and he was not making sense. Speech was back to normal by the time he got to the ED. He was admitted to the hospital where MRI brain and CTA did not show any acute process. MRI did show brain atrophy and chronic lacunar infarcts in the left cerebellum and right thalamus, which were present on prior imaging in 2018. He was found to be hypokalemic. A1c 5.7, LDL 61. TTE showed normal EF. Vitamin B12 was 290 and he was started on B12 supplements. He was also started on ASA 81 mg daily in addition to his daily Xarelto.  He has had some ongoing imbalance since his discharge. Had one episode of vertigo a few weeks Has a history of Meniere's disease and took a meclizine, then felt better after ~20 minutes. Vertigo has improved, but he has some residual unsteadiness. When he walks down the hall he feels he is veering to the right. Had a mechanical fall when his foot got stuck on a garbage can. He does not believe this was due to balance issues. Notes he had an ablation done for lumbar stenosis 3 weeks ago, and feels the pain has been returning in the past week or so.  OTHER MEDICAL CONDITIONS: CAD s/p CABG, HTN, aortic stenosis  s/p AVR, afib s/p ablation, GERD, HLD, remote CVA 2018   REVIEW OF SYSTEMS: Full 14 system review of systems performed and negative with exception of: imbalance, slurred speech, visual hallucination  ALLERGIES: No Known Allergies  HOME MEDICATIONS: Outpatient Medications Prior to Visit  Medication Sig Dispense Refill   DULoxetine (CYMBALTA) 60 MG capsule Take 60 mg by mouth daily.     ibuprofen (ADVIL) 800 MG tablet Take 1,600 mg by mouth every 8 (eight) hours as needed for pain.     meclizine (ANTIVERT) 25 MG tablet Take 25 mg by mouth 3 (three) times daily as needed for dizziness.     pantoprazole (PROTONIX) 40 MG tablet Take 40 mg by mouth daily.     potassium chloride (KLOR-CON) 10 MEQ tablet Take 10 mEq by mouth daily.     rivaroxaban (XARELTO) 20 MG TABS tablet Take 1 tablet (20 mg total) by mouth daily with supper. 30 tablet 5   rosuvastatin (CRESTOR) 10 MG tablet TAKE ONE TABLET BY MOUTH EVERY DAY IN THE EVENING (Patient taking differently: Take 10 mg by mouth daily.) 90 tablet 3   sotalol (BETAPACE) 80 MG tablet Take 1 tablet (80 mg total) by mouth 2 (two) times daily. 180 tablet 2   tamsulosin (FLOMAX) 0.4 MG CAPS capsule Take 0.4 mg by mouth daily after supper.     temazepam (RESTORIL) 15 MG capsule Take 15 mg by mouth at  bedtime.      traMADol (ULTRAM) 50 MG tablet Take 50 mg by mouth every 6 (six) hours as needed for pain (back pain).     triamterene-hydrochlorothiazide (MAXZIDE-25) 37.5-25 MG tablet TAKE ONE TABLET BY MOUTH EVERY DAY 90 tablet 1   fexofenadine (ALLEGRA) 180 MG tablet Take 180 mg by mouth 2 (two) times daily.  (Patient not taking: Reported on 07/06/2022)     omeprazole (PRILOSEC) 20 MG capsule Take 20 mg by mouth daily. (Patient not taking: Reported on 07/06/2022)     No facility-administered medications prior to visit.    PAST MEDICAL HISTORY: Past Medical History:  Diagnosis Date   Aortic atherosclerosis ([place]) 06/18/2021   Noted on 2020 CTA    Ascending aorta dilatation ([facility]) 05/16/2019   Ataxia    Atrial tachycardia    s/p ablation and on sotolol   BPH (benign prostatic hyperplasia)    Chronic frontal sinusitis 09/12/2014   Chronic insomnia    Chronic sphenoidal sinusitis 09/12/2014   CKD (chronic kidney disease)    Depression    Dyslipidemia 03/18/2015   Essential hypertension 03/18/2015   GERD (gastroesophageal reflux disease)    H/O cardiac radiofrequency ablation 08/2016   Hyperlipidemia    Hypertension    Hypertrophy of both inferior nasal turbinates 09/12/2014   Memory loss    Meniere's disease of left ear    Multiple nasal polyps 09/12/2014   Nasal septal deviation 09/12/2014   PAF (paroxysmal atrial fibrillation) ([place]) 12/17/2020   S/P aorta repair    S/P AVR (aortic valve replacement) 2010   s/p valve-in-valve TAVR 07/17/2019   s/p VIV TAVR via the TA approach after failed TF case 07/03/19   Severe aortic stenosis 05/16/2019   Status post radiofrequency ablation for arrhythmia 03/18/2015   TIA (transient ischemic attack)    Vertigo     PAST SURGICAL HISTORY: Past Surgical History:  Procedure Laterality Date   ABLATION OF DYSRHYTHMIC FOCUS     CARDIAC VALVE REPLACEMENT     CHOLECYSTECTOMY     KIDNEY STONE SURGERY     KNEE ARTHROSCOPY Bilateral    MASS EXCISION Right 12/26/2017   Procedure: RIGHT INDEX EXCISION CYST AND DEBRIDEMENT OF DISTAL INTERPHALANGEAL JOINT;  Surgeon: Leanora Cover, MD;  Location: [facility];  Service: Orthopedics;  Laterality: Right;  Bier block   RIGHT HEART CATH AND CORONARY ANGIOGRAPHY N/A 06/20/2019   Procedure: RIGHT HEART CATH AND CORONARY ANGIOGRAPHY;  Surgeon: Sherren Mocha, MD;  Location: [facility];  Service: Cardiovascular;  Laterality: N/A;   TEE WITHOUT CARDIOVERSION N/A 07/03/2019   Procedure: TRANSESOPHAGEAL ECHOCARDIOGRAM (TEE);  Surgeon: Sherren Mocha, MD;  Location: [facility];  Service: Open Heart Surgery;  Laterality: N/A;   TEE WITHOUT  CARDIOVERSION N/A 07/17/2019   Procedure: TRANSESOPHAGEAL ECHOCARDIOGRAM (TEE);  Surgeon: Sherren Mocha, MD;  Location: [facility];  Service: Open Heart Surgery;  Laterality: N/A;   TRANSCATHETER AORTIC VALVE REPLACEMENT, TRANSAPICAL N/A 07/17/2019   Procedure: TRANSCATHETER AORTIC VALVE REPLACEMENT, TRANSAPICAL;  Surgeon: Sherren Mocha, MD;  Location: [facility];  Service: Open Heart Surgery;  Laterality: N/A;   TRANSCATHETER AORTIC VALVE REPLACEMENT, TRANSFEMORAL N/A 07/03/2019   Procedure: attempted TRANSCATHETER AORTIC VALVE REPLACEMENT, TRANSFEMORAL;  Surgeon: Sherren Mocha, MD;  Location: [facility];  Service: Open Heart Surgery;  Laterality: N/A;    FAMILY HISTORY: Family History  Problem Relation Age of Onset   CAD Brother     SOCIAL HISTORY: Social History   Socioeconomic History   Marital status: Married  Spouse name: Not on file   Number of children: 1   Years of education: Not on file   Highest education level: Not on file  Occupational History    Comment: police officer  Tobacco Use   Smoking status: Never   Smokeless tobacco: Never  Vaping Use   Vaping Use: Never used  Substance and Sexual Activity   Alcohol use: No   Drug use: No   Sexual activity: Not on file  Other Topics Concern   Not on file  Social History Narrative   Lives with wife   retired   Investment banker, 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 Wt Readings from Last 3 Encounters:  07/06/22 191 lb 4 oz (86.8 kg)  01/14/22 185 lb 9.6 oz (84.2 kg)  06/16/21 184 lb 9.6 oz (83.7 kg)    NEUROLOGIC: MENTAL STATUS:  awake, alert, oriented to person, place and time recent and remote memory intact normal attention and concentration  CRANIAL NERVE:  2nd, 3rd, 4th, 6th - pupils equal and reactive to light, visual fields full to confrontation, extraocular muscles intact, no nystagmus 5th - facial sensation symmetric 7th - facial strength symmetric 8th - hearing intact 9th - palate elevates symmetrically, uvula midline 11th - shoulder shrug symmetric 12th - tongue protrusion midline  MOTOR:  normal bulk and tone, full strength in the BUE, BLE  SENSORY:  normal and symmetric to light touch all 4 extremities  COORDINATION:  finger-nose-finger, heel-to-shin without dysmetria. Fine finger movements normal. Action tremor present bilaterally  REFLEXES:  deep tendon reflexes present and symmetric  GAIT/STATION:  Normal based gait   DIAGNOSTIC DATA (LABS, IMAGING, TESTING) - I reviewed patient records, labs, notes, testing and imaging myself where available.  Lab Results  Component Value Date   WBC 7.5 07/09/2020   HGB 15.1 07/09/2020   HCT 43.0 07/09/2020   MCV 93 07/09/2020   PLT 189 07/09/2020      Component Value Date/Time   NA 144 08/14/2020 1357   K 4.4 08/14/2020 1357   CL 102 08/14/2020 1357   CO2 24 08/14/2020 1357   GLUCOSE 77 08/14/2020 1357   GLUCOSE 114 (H) 07/19/2019 0235   BUN 13 08/14/2020 1357   CREATININE 0.96 08/14/2020 1357   CALCIUM 9.5 08/14/2020 1357   PROT 6.9 07/13/2019 1400   ALBUMIN 4.2 07/13/2019 1400   AST 27 07/13/2019 1400   ALT 21 07/13/2019 1400   ALKPHOS 72 07/13/2019 1400   BILITOT 1.5 (H) 07/13/2019 1400   GFRNONAA 78 08/14/2020 1357   GFRAA 90 08/14/2020 1357   No results found for: "CHOL", "HDL", "LDLCALC", "LDLDIRECT", "TRIG", "CHOLHDL" Lab Results  Component Value Date   HGBA1C 5.4 07/13/2019   No results found for: "VITAMINB12" Lab Results  Component Value Date   TSH 2.530 10/16/2020      ASSESSMENT AND PLAN  76 y.o. year old male with a history of CAD s/p CABG, HTN, aortic stenosis s/p AVR, afib s/p ablation, GERD, HLD, remote CVA 2018 who  presents for evaluation of slurred speech and imbalance. MRI brain and CTA head/neck showed no acute process. He does have a remote left cerebellar stroke. Unclear if this episode truly represents a TIA given ongoing symptoms for 4-5 days, and associated episode of well-formed hallucinations. Will order EEG to rule out seizure activity. It is possible his current balance issues are unrelated to the confusional episode and are secondary to his Meniere's disease and lumbar stenosis, though symptoms did seem to worsen around his recent episode. Discussed increased bleeding risk with combination of ASA and Xarelto. Unsure of the benefit of being on both of these medications. Discussed this with the patient, however he prefers to continue both medications at this time. Will refer for an opinion from our Stroke team regarding continuation of both ASA and Xarelto. He will also see his Cardiologist in the next couple of weeks to discuss this regimen.   1. Transient alteration of awareness       PLAN: -Routine EEG -Referral to Stroke team regarding ASA and Xarelto   Orders Placed This Encounter  Procedures   Ambulatory referral to Neurology   EEG adult     Genia Harold, MD 07/06/22 10:29 AM  I spent an average of 60 minutes chart reviewing and counseling the patient, with at least 50% of the time face to face with the patient.   [facility] [address] [place], [place] [address] [phone]

## 2022-07-06 NOTE — Telephone Encounter (Signed)
After visit today pt arrived at check and stated he was dizzy. Jillian asked if I could come and check on the pt. Once I arrived the pt was sitting up right talking in check out chair with his wife and son near by.  I talked with the pt and family and learned pt felt dizzy at check out and had to lean on son to stay up right.  I checked his vital signs which showed BP 118 75 HR 76, denied any dizziness once I arrived, shortness of breath or H/a.   He reported dizziness is not normal sx for him but does have hx of Meniere's Disease and a-FIB. We were able to schedule EEG for tomorrow at our office.   I assisted pt to the car via wheelchair and he left with son (driving the car) and wife. He reported he felt normal at the time of departure.   He will keep [place] up to date if dizziness continue to be an issue.

## 2022-07-06 NOTE — Patient Instructions (Addendum)
Plan: EEG Referral to Stroke neurology   Preventing Falls at [facility] are common, often dreaded events in the lives of older people. Aside from the obvious injuries and even death that may result, fall can cause wide-ranging consequences including loss of independence, mental decline, decreased activity and mobility. Younger people are also at risk of falling, especially those with chronic illnesses and fatigue.  Ways to reduce risk for falling   Examine diet and medications. Warm foods and alcohol dilate blood vessels, which can lead to dizziness when standing. Sleep aids, antidepressants and pain medications can also increase the likelihood of a fall.   Get a vision exam. Poor vision, cataracts and glaucoma increase the chances of falling.   Check foot gear. Shoes should fit snugly and have a sturdy, nonskid sole and a broad, low heel   Participate in a physician-approved exercise program to build and maintain muscle strength and improve balance and coordination. Programs that use ankle weights or stretch bands are excellent for muscle-strengthening. Water aerobics programs and low-impact Tai Chi programs have also been shown to improve balance and coordination.   Increase vitamin D intake. Vitamin D improves muscle strength and increases the amount of calcium the body is able to absorb and deposit in bones.  How to prevent falls from common hazards   Floors -- Remove all loose wires, cords, and throw rugs. Minimize clutter. Make sure rugs are anchored and smooth. Keep furniture in its usual place.   Chairs -- Use chairs with straight backs, armrests and firm seats. Add firm cushions to existing pieces to add height.   Bathroom -- Install grab bars and non-skid tape in the tub or shower. Use a bathtub transfer bench or a shower chair with a back support Use an elevated toilet seat and/or safety rails to assist standing from a low surface. Do not use towel racks or bathroom tissue holders to help  you stand.   Lighting -- Make sure halls, stairways, and entrances are well-lit. Install a night light in your bathroom or hallway. Make sure there is a light switch at the top and bottom of the staircase. Turn lights on if you get up in the middle of the night. Make sure lamps or light switches are within reach of the bed if you have to get up during the night.   Kitchen -- Install non-skid rubber mats near the sink and stove. Clean spills immediately. Store frequently used utensils, pots, pans between waist and eye level. This helps prevent reaching and bending. Sit when getting things out of lower cupboards.   Living room / [place] furniture with wide spaces in between, giving enough room to move around. Establish a route through the living room that gives you something to hold onto as you walk.   Stairs -- Make sure treads, rails, and rugs are secure. Install a rail on both sides of the stairs. If stairs are a threat, it might be helpful to arrange most of your activities on the lower level to reduce the number of times you must climb the stairs.   Entrances and doorways -- Install metal handles on the walls adjacent to the doorknobs of all doors to make it more secure as you travel through the doorway.   Tips for maintaining balance   Keep at least one hand free at all times. Try using a backpack or fanny pack to hold things rather than carrying them in your hands. Never carry objects in both  hands when walking as this interferes with keeping your balance.   Attempt to swing both arms from front to back while walking. This might require a conscious effort if Parkinson's disease has diminished your movement. It will, however, help you to maintain balance and posture, and reduce fatigue.   Consciously lift your feet off of the ground when walking. Shuffling and dragging of the feet is a common culprit in losing your balance.   When trying to navigate turns, use a "U" technique of facing  forward and making a wide turn, rather than pivoting sharply.   Try to stand with your feet shoulder-length apart. When your feet are close together for any length of time, you increase your risk of losing your balance and falling.   Do one thing at a time. Don't try to walk and accomplish another task, such as reading or looking around. The decrease in your automatic reflexes complicates motor function, so the less distraction, the better.   Do not wear rubber or gripping soled shoes, they might "catch" on the floor and cause tripping.   Move slowly when changing positions. Use deliberate, concentrated movements and, if needed, use a grab bar or walking aid. Count 15 seconds between each movement. For example, when rising from a seated position, wait 15 seconds after standing to begin walking.   If balance is a continuous problem, you might want to consider a walking aid such as a cane, walking stick, or walker. Once you've mastered walking with help, you might be ready to try it on your own again.

## 2022-07-07 ENCOUNTER — Ambulatory Visit: Payer: PPO | Admitting: Neurology

## 2022-07-07 DIAGNOSIS — R4182 Altered mental status, unspecified: Secondary | ICD-10-CM | POA: Diagnosis not present

## 2022-07-07 DIAGNOSIS — R404 Transient alteration of awareness: Secondary | ICD-10-CM

## 2022-07-07 NOTE — Procedures (Signed)
    History:  76  year old man with alteration of awareness   EEG classification: Awake and drowsy  Description of the recording: The background rhythms of this recording consists of a fairly well modulated medium amplitude alpha rhythm of 8 Hz that is reactive to eye opening and closure. Present in the anterior head region is a 15-20 Hz beta activity. Photic stimulation was performed, did not show any abnormalities. Hyperventilation was also performed, did not show any abnormalities. Drowsiness was manifested by background fragmentation. No abnormal epileptiform discharges seen during this recording. There was no focal slowing. There were no electrographic seizure identified.   Abnormality: None   Impression: This is a normal EEG recorded while drowsy and awake. No evidence of interictal epileptiform discharges. Normal EEGs, however, do not rule out epilepsy.    Alric Ran, MD [facility]

## 2022-07-13 ENCOUNTER — Other Ambulatory Visit: Payer: Self-pay | Admitting: Cardiology

## 2022-07-13 ENCOUNTER — Telehealth: Payer: Self-pay

## 2022-07-13 NOTE — Telephone Encounter (Signed)
Called and left detailed voicemail about EEG results.

## 2022-07-13 NOTE — Telephone Encounter (Signed)
-----   Message from Genia Harold, MD sent at 07/07/2022  1:01 PM EDT ----- EEG is normal with no evidence of seizures

## 2022-07-19 ENCOUNTER — Telehealth: Payer: Self-pay | Admitting: Psychiatry

## 2022-07-19 DIAGNOSIS — M47816 Spondylosis without myelopathy or radiculopathy, lumbar region: Secondary | ICD-10-CM | POA: Diagnosis not present

## 2022-07-19 NOTE — Telephone Encounter (Signed)
I have electronically routed the office visit noted to the patient's PCP

## 2022-07-19 NOTE — Telephone Encounter (Signed)
Pt wife is calling. Stated she would like a copy of doctor notes from appointment sent to PCP Dr. Delena Bali.

## 2022-07-20 DIAGNOSIS — R972 Elevated prostate specific antigen [PSA]: Secondary | ICD-10-CM | POA: Diagnosis not present

## 2022-07-20 DIAGNOSIS — Z139 Encounter for screening, unspecified: Secondary | ICD-10-CM | POA: Diagnosis not present

## 2022-07-20 DIAGNOSIS — N401 Enlarged prostate with lower urinary tract symptoms: Secondary | ICD-10-CM | POA: Diagnosis not present

## 2022-07-20 DIAGNOSIS — E785 Hyperlipidemia, unspecified: Secondary | ICD-10-CM | POA: Diagnosis not present

## 2022-07-20 DIAGNOSIS — Z79899 Other long term (current) drug therapy: Secondary | ICD-10-CM | POA: Diagnosis not present

## 2022-07-20 DIAGNOSIS — I480 Paroxysmal atrial fibrillation: Secondary | ICD-10-CM | POA: Diagnosis not present

## 2022-07-20 DIAGNOSIS — K449 Diaphragmatic hernia without obstruction or gangrene: Secondary | ICD-10-CM | POA: Diagnosis not present

## 2022-07-20 DIAGNOSIS — I10 Essential (primary) hypertension: Secondary | ICD-10-CM | POA: Diagnosis not present

## 2022-07-20 DIAGNOSIS — M5137 Other intervertebral disc degeneration, lumbosacral region: Secondary | ICD-10-CM | POA: Diagnosis not present

## 2022-07-20 DIAGNOSIS — H8102 Meniere's disease, left ear: Secondary | ICD-10-CM | POA: Diagnosis not present

## 2022-07-20 DIAGNOSIS — Z8673 Personal history of transient ischemic attack (TIA), and cerebral infarction without residual deficits: Secondary | ICD-10-CM | POA: Diagnosis not present

## 2022-07-20 DIAGNOSIS — K219 Gastro-esophageal reflux disease without esophagitis: Secondary | ICD-10-CM | POA: Diagnosis not present

## 2022-07-20 DIAGNOSIS — F5104 Psychophysiologic insomnia: Secondary | ICD-10-CM | POA: Diagnosis not present

## 2022-08-02 ENCOUNTER — Telehealth: Payer: Self-pay | Admitting: Neurology

## 2022-08-02 NOTE — Telephone Encounter (Signed)
Pt wife is calling. Requesting a sooner appointment. Stated pt is having falling out spells and feels he needs to be seen soon. She is requesting a call-back from nurse.

## 2022-08-02 NOTE — Telephone Encounter (Signed)
Appt available on 10/27 '@10am'$ 

## 2022-08-03 NOTE — Telephone Encounter (Signed)
Noted, pt scheduled for 08/09/2022 with Dr. Leonie Man.

## 2022-08-09 ENCOUNTER — Ambulatory Visit: Payer: PPO | Admitting: Neurology

## 2022-08-09 ENCOUNTER — Encounter: Payer: Self-pay | Admitting: Neurology

## 2022-08-09 ENCOUNTER — Telehealth: Payer: Self-pay | Admitting: Cardiology

## 2022-08-09 VITALS — BP 125/81 | HR 71 | Ht 69.0 in | Wt 191.5 lb

## 2022-08-09 DIAGNOSIS — R269 Unspecified abnormalities of gait and mobility: Secondary | ICD-10-CM

## 2022-08-09 DIAGNOSIS — I4891 Unspecified atrial fibrillation: Secondary | ICD-10-CM

## 2022-08-09 DIAGNOSIS — G250 Essential tremor: Secondary | ICD-10-CM | POA: Diagnosis not present

## 2022-08-09 DIAGNOSIS — G459 Transient cerebral ischemic attack, unspecified: Secondary | ICD-10-CM | POA: Diagnosis not present

## 2022-08-09 MED ORDER — TOPIRAMATE 25 MG PO TABS: 25.0000 mg | ORAL_TABLET | Freq: Two times a day (BID) | ORAL | 3 refills | Status: DC

## 2022-08-09 MED ORDER — APIXABAN 5 MG PO TABS: 5.0000 mg | ORAL_TABLET | Freq: Two times a day (BID) | ORAL | 1 refills | Status: DC

## 2022-08-09 NOTE — Patient Instructions (Signed)
I had a long discussion with the patient and his wife and son regarding his recent episode of transient slurred speech, confusion as well as gait ataxia likely representing TIA despite being on anticoagulation with Xarelto for his A-fib.  I recommend he discontinue aspirin due to increased low bruising and bleeding that he has had recently.  I also recommend switching Xarelto to Eliquis due to lack of efficacy .  Check lipid profile, hemoglobin A1c.  He also has gait and balance difficulties which are likely multifactorial due to combination of his vestibular dysfunction and encouraged him to get up slowly and avoid sudden movements he also has mild benign essential tremor thyroid function test and B12 a trial of Topamax 25 mg twice daily to be increased as tolerated without side effects.  Recommend follow-up with Dr. Billey Gosling neurologist in 3 months or call earlier if necessary.  Essential Tremor A tremor is trembling or shaking that a person cannot control. Most tremors affect the hands or arms. Tremors can also affect the head, vocal cords, legs, and other parts of the body. Essential tremor is a tremor without a known cause. Usually, it occurs while a person is trying to perform an action. It tends to get worse gradually as a person ages. What are the causes? The cause of this condition is not known, but it often runs in families. What increases the risk? You are more likely to develop this condition if: You have a family member with essential tremor. You are 80 years of age or older. What are the signs or symptoms? The main sign of a tremor is a rhythmic shaking of certain parts of your body that is uncontrolled and unintentional. You may: Have difficulty eating with a spoon or fork. Have difficulty writing. Nod your head up and down or side to side. Have a quivering voice. The shaking may: Get worse over time. Come and go. Be more noticeable on one side of your body. Get worse due to stress,  tiredness (fatigue), caffeine, and extreme heat or cold. How is this diagnosed? This condition may be diagnosed based on: Your symptoms and medical history. A physical exam. There is no single test to diagnose an essential tremor. However, your health care provider may order tests to rule out other causes of your condition. These may include: Blood and urine tests. Imaging studies of your brain, such as a CT scan or MRI. How is this treated? Treatment for essential tremor depends on the severity of the condition. Mild tremors may not need treatment if they do not affect your day-to-day life. Severe tremors may need to be treated using one or more of the following options: Medicines. Injections of a substance called botulinum toxin. Procedures such as deep brain stimulation (DBS) implantation or MRI-guided ultrasound treatment. Lifestyle changes. Occupational or physical therapy. Follow these instructions at home: Lifestyle  Do not use any products that contain nicotine or tobacco. These products include cigarettes, chewing tobacco, and vaping devices, such as e-cigarettes. If you need help quitting, ask your health care provider. Limit your caffeine intake as told by your health care provider. Try to get 8 hours of sleep each night. Find ways to manage your stress that fit your lifestyle and personality. Consider trying meditation or yoga. Try to anticipate stressful situations and allow extra time to manage them. If you are struggling emotionally with the effects of your tremor, consider working with a mental health provider. General instructions Take over-the-counter and prescription medicines only as  told by your health care provider. Avoid extreme heat and extreme cold. Keep all follow-up visits. This is important. Visits may include physical therapy visits. Where to find more information [employer] of Neurological Disorders and Stroke: MasterBoxes.it Contact a health  care provider if: You experience any changes in the location or intensity of your tremors. You start having a tremor after starting a new medicine. You have a tremor with other symptoms, such as: Numbness. Tingling. Pain. Weakness. Your tremor gets worse. Your tremor interferes with your daily life. You feel down, blue, or sad for at least 2 weeks in a row. Worrying about your tremor and what other people think about you interferes with your everyday life functions, including relationships, work, or school. Summary Essential tremor is a tremor without a known cause. Usually, it occurs when you are trying to perform an action. You are more likely to develop this condition if you have a family member with essential tremor. The main sign of a tremor is a rhythmic shaking of certain parts of your body that is uncontrolled and unintentional. Treatment for essential tremor depends on the severity of the condition. This information is not intended to replace advice given to you by your health care provider. Make sure you discuss any questions you have with your health care provider. Document Revised: 06/19/2021 Document Reviewed: 06/19/2021 Elsevier Patient Education  [place].  Stroke Prevention Some medical conditions and behaviors can lead to a higher chance of having a stroke. You can help prevent a stroke by eating healthy, exercising, not smoking, and managing any medical conditions you have. Stroke is a leading cause of functional impairment. Primary prevention is particularly important because a majority of strokes are first-time events. Stroke changes the lives of not only those who experience a stroke but also their family and other caregivers. How can this condition affect me? A stroke is a medical emergency and should be treated right away. A stroke can lead to brain damage and can sometimes be life-threatening. If a person gets medical treatment right away, there is a better  chance of surviving and recovering from a stroke. What can increase my risk? The following medical conditions may increase your risk of a stroke: Cardiovascular disease. High blood pressure (hypertension). Diabetes. High cholesterol. Sickle cell disease. Blood clotting disorders (hypercoagulable state). Obesity. Sleep disorders (obstructive sleep apnea). Other risk factors include: Being older than age 90. Having a history of blood clots, stroke, or mini-stroke (transient ischemic attack, TIA). Genetic factors, such as race, ethnicity, or a family history of stroke. Smoking cigarettes or using other tobacco products. Taking birth control pills, especially if you also use tobacco. Heavy use of alcohol or drugs, especially cocaine and methamphetamine. Physical inactivity. What actions can I take to prevent this? Manage your health conditions High cholesterol levels. Eating a healthy diet is important for preventing high cholesterol. If cholesterol cannot be managed through diet alone, you may need to take medicines. Take any prescribed medicines to control your cholesterol as told by your health care provider. Hypertension. To reduce your risk of stroke, try to keep your blood pressure below 130/80. Eating a healthy diet and exercising regularly are important for controlling blood pressure. If these steps are not enough to manage your blood pressure, you may need to take medicines. Take any prescribed medicines to control hypertension as told by your health care provider. Ask your health care provider if you should monitor your blood pressure at home. Have your blood pressure  checked every year, even if your blood pressure is normal. Blood pressure increases with age and some medical conditions. Diabetes. Eating a healthy diet and exercising regularly are important parts of managing your blood sugar (glucose). If your blood sugar cannot be managed through diet and exercise, you may need  to take medicines. Take any prescribed medicines to control your diabetes as told by your health care provider. Get evaluated for obstructive sleep apnea. Talk to your health care provider about getting a sleep evaluation if you snore a lot or have excessive sleepiness. Make sure that any other medical conditions you have, such as atrial fibrillation or atherosclerosis, are managed. Nutrition Follow instructions from your health care provider about what to eat or drink to help manage your health condition. These instructions may include: Reducing your daily calorie intake. Limiting how much salt (sodium) you use to 1,500 milligrams (mg) each day. Using only healthy fats for cooking, such as olive oil, canola oil, or sunflower oil. Eating healthy foods. You can do this by: Choosing foods that are high in fiber, such as whole grains, and fresh fruits and vegetables. Eating at least 5 servings of fruits and vegetables a day. Try to fill one-half of your plate with fruits and vegetables at each meal. Choosing lean protein foods, such as lean cuts of meat, poultry without skin, fish, tofu, beans, and nuts. Eating low-fat dairy products. Avoiding foods that are high in sodium. This can help lower blood pressure. Avoiding foods that have saturated fat, trans fat, and cholesterol. This can help prevent high cholesterol. Avoiding processed and prepared foods. Counting your daily carbohydrate intake.  Lifestyle If you drink alcohol: Limit how much you have to: 0-1 drink a day for women who are not pregnant. 0-2 drinks a day for men. Know how much alcohol is in your drink. In the [place] one drink equals one 12 oz bottle of beer (368m), one 5 oz glass of wine (1444m, or one 1 oz glass of hard liquor (4478m Do not use any products that contain nicotine or tobacco. These products include cigarettes, chewing tobacco, and vaping devices, such as e-cigarettes. If you need help quitting, ask your health  care provider. Avoid secondhand smoke. Do not use drugs. Activity  Try to stay at a healthy weight. Get at least 30 minutes of exercise on most days, such as: Fast walking. Biking. Swimming. Medicines Take over-the-counter and prescription medicines only as told by your health care provider. Aspirin or blood thinners (antiplatelets or anticoagulants) may be recommended to reduce your risk of forming blood clots that can lead to stroke. Avoid taking birth control pills. Talk to your health care provider about the risks of taking birth control pills if: You are over 35 47ars old. You smoke. You get very bad headaches. You have had a blood clot. Where to find more information American Stroke Association: www.strokeassociation.org Get help right away if: You or a loved one has any symptoms of a stroke. "BE FAST" is an easy way to remember the main warning signs of a stroke: B - Balance. Signs are dizziness, sudden trouble walking, or loss of balance. E - Eyes. Signs are trouble seeing or a sudden change in vision. F - Face. Signs are sudden weakness or numbness of the face, or the face or eyelid drooping on one side. A - Arms. Signs are weakness or numbness in an arm. This happens suddenly and usually on one side of the body. S - Speech. Signs are sudden  trouble speaking, slurred speech, or trouble understanding what people say. T - Time. Time to call emergency services. Write down what time symptoms started. You or a loved one has other signs of a stroke, such as: A sudden, severe headache with no known cause. Nausea or vomiting. Seizure. These symptoms may represent a serious problem that is an emergency. Do not wait to see if the symptoms will go away. Get medical help right away. Call your local emergency services (911 in the [place] Do not drive yourself to the hospital. Summary You can help to prevent a stroke by eating healthy, exercising, not smoking, limiting alcohol intake, and  managing any medical conditions you may have. Do not use any products that contain nicotine or tobacco. These include cigarettes, chewing tobacco, and vaping devices, such as e-cigarettes. If you need help quitting, ask your health care provider. Remember "BE FAST" for warning signs of a stroke. Get help right away if you or a loved one has any of these signs. This information is not intended to replace advice given to you by your health care provider. Make sure you discuss any questions you have with your health care provider. Document Revised: 03/31/2020 Document Reviewed: 03/31/2020 Elsevier Patient Education  [place].

## 2022-08-09 NOTE — Telephone Encounter (Signed)
Spoke with Vaughan Basta and sample placed with pt assistance paperwork. Vaughan Basta verbalized understanding and had no additional questions.

## 2022-08-09 NOTE — Progress Notes (Signed)
[facility] [address] [place]. [place] [address] [id number]       OFFICE CONSULT NOTE  [name] Date of Birth:  Sep 24, 1945 Medical Record Number:  [id number]   Referring MD: Genia Harold  Reason for Referral: TIA  HPI: [name] is a 76 promonocyte 30.  He also complains Caucasian male seen today for initial office consultation visit for second opinion.  He is accompanied by his wife and son.  History is obtained from them and review of electronic medical records personally reviewed pertinent available imaging films in PACS.  Mr. Bunda has extensive past medical history including hypertension, hyperlipidemia, severe aortic stenosis status post TAVR, atrial fibrillation s/p ablation, Mnire's disease, memory loss.  He has been on Xarelto for anticoagulation as a nephrectomy for a second opinion from [facility] despite being on the anticoagulation by Dr. Billey Gosling..  On June 25, 2022 patient states he went to bed for his wife and subsequently was found to be confused he was asking questions like bruises changes at the breakfast table.  He subsequently took a nap but when he woke up his speech was slurred and was not making sense.  Patient also had trouble remembering.  He was seen in the [facility] ER but his symptoms resolved shortly.  MRI scan was negative for acute stroke.  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  Vitamin B12 was found to be low at 290 started on supplement.  Milligram daily was added to LDL cholesterol was 61 mg percent 2D echo showed normal ejection.  He had outpatient EEG done on 07/07/2022 which was normal.  He 3 weeks ago had 2 of his wisdom teeth pulled  and Xarelto was stopped only 1 day   prior.  Subsequently had  bleeding in his throat and bruising which is improving.  Patient also had another episode 3 weeks ago when he felt that his head was swirling side-to-side.  He denied nausea ,vomiting or blurred vision.  His balance is off and he was falling backwards and in fact fell and hit the back of his head on the floor but fortunately there was no injury or bleeding.  This feeling lasted only few minutes and recovered.  Did not seek medical help.  Patient has also noticed that his balance is more poor and he tends to lean to 1 side particularly when he gets up quickly or makes a sudden movement.  He is often able to catch himself without falling.  Patient does complain of chronic back pain and difficulty walking and states the muscles in his back tighten up after he walks a lot.  This is a lot better after epidural injections that he has been getting.  Patient also complains of bilateral hand tremors that he has had for the last 2 years.  They seem to be slightly worse recently.  He has trouble holding a cup of coffee and his hand shakes.  He is also noticed difficulty picking up with blood.  Hand strengthening.  Denies any bradykinesia, drooling of saliva, softness of voice or increased stooped posture.  He does have family history of tremors in 2 of his brothers but they have not been given a formal diagnosis of benign essential tremor.  He does not drink alcohol and  is unable to describe what effect alcohol has on his tremors.   Control.  Fortunately there is no also been complaining of worsening of his balance feels that when he gets  ROS:   14 system review of systems is positive for hand tremors, imbalance, walking difficulty, bruising, bleeding and all other systems negative  PMH:  Past Medical History:  Diagnosis Date   Aortic atherosclerosis ([facility]) 06/18/2021   Noted on 2020 CTA   Ascending aorta dilatation ([facility]) 05/16/2019   Ataxia    Atrial tachycardia    s/p ablation and on sotolol   BPH (benign  prostatic hyperplasia)    Chronic frontal sinusitis 09/12/2014   Chronic insomnia    Chronic sphenoidal sinusitis 09/12/2014   CKD (chronic kidney disease)    Depression    Dyslipidemia 03/18/2015   Essential hypertension 03/18/2015   GERD (gastroesophageal reflux disease)    H/O cardiac radiofrequency ablation 08/2016   Hyperlipidemia    Hypertension    Hypertrophy of both inferior nasal turbinates 09/12/2014   Memory loss    Meniere's disease of left ear    Multiple nasal polyps 09/12/2014   Nasal septal deviation 09/12/2014   PAF (paroxysmal atrial fibrillation) ([facility]) 12/17/2020   S/P aorta repair    S/P AVR (aortic valve replacement) 2010   s/p valve-in-valve TAVR 07/17/2019   s/p VIV TAVR via the TA approach after failed TF case 07/03/19   Severe aortic stenosis 05/16/2019   Status post radiofrequency ablation for arrhythmia 03/18/2015   TIA (transient ischemic attack)    Vertigo     Social History:  Social History   Socioeconomic History   Marital status: Married    Spouse name: Not on file   Number of children: 1   Years of education: Not on file   Highest education level: Not on file  Occupational History    Comment: Engineer, structural  Tobacco Use   Smoking status: Never   Smokeless tobacco: Never  Vaping Use   Vaping Use: Never used  Substance and Sexual Activity   Alcohol use: No   Drug use: No   Sexual activity: Not on file  Other Topics Concern   Not on file  Social History Narrative   Lives with wife   retired   Science writer Determinants of Radio broadcast assistant Strain: Not on file  Food Insecurity: Not on file  Transportation Needs: Not on file  Physical Activity: Not on file  Stress: Not on file  Social Connections: Not on file  Intimate Partner Violence: Not on file    Medications:   Current Outpatient Medications on File Prior to Visit  Medication Sig Dispense Refill   DULoxetine (CYMBALTA) 60 MG capsule Take 60 mg by mouth daily.      ibuprofen (ADVIL) 800 MG tablet Take 1,600 mg by mouth every 8 (eight) hours as needed for pain.     meclizine (ANTIVERT) 25 MG tablet Take 25 mg by mouth 3 (three) times daily as needed for dizziness.     pantoprazole (PROTONIX) 40 MG tablet Take 40 mg by mouth daily.     potassium chloride (KLOR-CON) 10 MEQ tablet Take 10 mEq by mouth daily.     rosuvastatin (CRESTOR) 10 MG tablet TAKE ONE TABLET BY MOUTH EVERY DAY IN THE EVENING (Patient taking differently: Take 10 mg by mouth daily.) 90 tablet 3   sotalol (BETAPACE) 80 MG tablet Take 1 tablet (80 mg total) by mouth 2 (two) times daily. 180 tablet  2   tamsulosin (FLOMAX) 0.4 MG CAPS capsule Take 0.4 mg by mouth daily after supper.     temazepam (RESTORIL) 15 MG capsule Take 15 mg by mouth at bedtime.      traMADol (ULTRAM) 50 MG tablet Take 50 mg by mouth every 6 (six) hours as needed for pain (back pain).     triamterene-hydrochlorothiazide (MAXZIDE-25) 37.5-25 MG tablet TAKE ONE TABLET BY MOUTH EVERY DAY 90 tablet 1   No current facility-administered medications on file prior to visit.    Allergies:  No Known Allergies  Physical Exam General: well developed, well nourished, seated, in no evident distress Head: head normocephalic and atraumatic.   Neck: supple with no carotid or supraclavicular bruits Cardiovascular: regular rate and rhythm, no murmurs Musculoskeletal: no deformity Skin:  no rash/petichiae Vascular:  Normal pulses all extremities  Neurologic Exam Mental Status: Awake and fully alert. Oriented to place and time. Recent and remote memory intact. Attention span, concentration and fund of knowledge appropriate. Mood and affect appropriate.  No diminished facial expression.  Glabellar tap is negative. Cranial Nerves: Fundoscopic exam reveals sharp disc margins. Pupils equal, briskly reactive to light. Extraocular movements full without nystagmus. Visual fields full to confrontation. Hearing intact. Facial sensation intact.  Face, tongue, palate moves normally and symmetrically.  Motor: Normal bulk and tone. Normal strength in all tested extremity muscles.  No resting tremor or bradykinesia.  Fine upper extremity action tremor left greater than right which can be suppressed. Sensory.: intact to touch , pinprick , position and vibratory sensation.  Coordination: Rapid alternating movements normal in all extremities. Finger-to-nose and heel-to-shin performed accurately bilaterally. Gait and Station: Arises from chair without difficulty. Stance is normal. Gait demonstrates normal stride length and mild imbalance .  Unable to stand on a narrow base or on either foot unsupported.  Unable to heel, toe and tandem walk without difficulty.  No diminished arm swing while walking. Reflexes: 1+ and symmetric. Toes downgoing.   NIHSS  0 Modified Rankin  1   ASSESSMENT: 76 year old Caucasian male with recent episodes of transient confusion and speech and gait and balance difficulties likely TIA likely of cardioembolic etiology from atrial fibrillation despite being on Xarelto.  He also has new complaints of longstanding bilateral mild upper extremity action tremor slightly benign essential tremor.  Chronic gait and balance difficulties likely multifactorial due to combination of cerebrovascular disease, degenerative spine disease and peripheral vestibular dysfunction     PLAN:I had a long discussion with the patient and his wife and son regarding his recent episode of transient slurred speech, confusion as well as gait ataxia likely representing TIA despite being on anticoagulation with Xarelto for his A-fib.  I recommend he discontinue aspirin due to increased  bruising and bleeding that he has had recently.  I also recommend switching Xarelto to Eliquis due to lack of efficacy .  Check lipid profile, hemoglobin A1c.  He also has gait and balance difficulties which are likely multifactorial due to combination of his vestibular  dysfunction and encouraged him to get up slowly and avoid sudden movements. He also has mild benign essential tremor and we will check thyroid function test and B12 and a trial of Topamax 25 mg twice daily to be increased as tolerated without side effects.  Recommend follow-up with Dr. Billey Gosling neurologist in 3 months or call earlier if necessary.  Greater than 50% time during this 60-minute prolonged consultation visit was spent on counseling and coordination of care about his TIA, gait  and balance difficulties and answering questions  Antony Contras, MD  Note: This document was prepared with digital dictation and possible smart phrase technology. Any transcriptional errors that result from this process are unintentional.

## 2022-08-09 NOTE — Telephone Encounter (Signed)
Pt c/o medication issue:  1. Name of Medication:  xarelto apixaban (ELIQUIS) 5 MG TABS tablet   2. How are you currently taking this medication (dosage and times per day)? Takes xarelto 1 tablet daily  3. Are you having a reaction (difficulty breathing--STAT)? no  4. What is your medication issue? Patient's wife says the patient has been having mini strokes and his doctor wants to switch him to eliquis. They would like to know if Dr. Geraldo Pitter thinks this is okay.

## 2022-08-10 LAB — CBC
Hematocrit: 40.7 % (ref 37.5–51.0)
Hemoglobin: 12.8 g/dL — ABNORMAL LOW (ref 13.0–17.7)
MCH: 26.1 pg — ABNORMAL LOW (ref 26.6–33.0)
MCHC: 31.4 g/dL — ABNORMAL LOW (ref 31.5–35.7)
MCV: 83 fL (ref 79–97)
Platelets: 202 10*3/uL (ref 150–450)
RBC: 4.9 x10E6/uL (ref 4.14–5.80)
RDW: 14.5 % (ref 11.6–15.4)
WBC: 6.2 10*3/uL (ref 3.4–10.8)

## 2022-08-10 LAB — THYROID PANEL WITH TSH
Free Thyroxine Index: 1.9 (ref 1.2–4.9)
T3 Uptake Ratio: 27 % (ref 24–39)
T4, Total: 6.9 ug/dL (ref 4.5–12.0)
TSH: 2.42 u[IU]/mL (ref 0.450–4.500)

## 2022-08-10 LAB — COMPREHENSIVE METABOLIC PANEL
ALT: 21 IU/L (ref 0–44)
AST: 30 IU/L (ref 0–40)
Albumin/Globulin Ratio: 2.2 (ref 1.2–2.2)
Albumin: 4.1 g/dL (ref 3.8–4.8)
Alkaline Phosphatase: 109 IU/L (ref 44–121)
BUN/Creatinine Ratio: 9 — ABNORMAL LOW (ref 10–24)
BUN: 10 mg/dL (ref 8–27)
Bilirubin Total: 0.7 mg/dL (ref 0.0–1.2)
CO2: 23 mmol/L (ref 20–29)
Calcium: 9.3 mg/dL (ref 8.6–10.2)
Chloride: 105 mmol/L (ref 96–106)
Creatinine, Ser: 1.08 mg/dL (ref 0.76–1.27)
Globulin, Total: 1.9 g/dL (ref 1.5–4.5)
Glucose: 83 mg/dL (ref 70–99)
Potassium: 4.2 mmol/L (ref 3.5–5.2)
Sodium: 141 mmol/L (ref 134–144)
Total Protein: 6 g/dL (ref 6.0–8.5)
eGFR: 72 mL/min/{1.73_m2} (ref >59)

## 2022-08-10 LAB — HEMOGLOBIN A1C
Est. average glucose Bld gHb Est-mCnc: 126 mg/dL
Hgb A1c MFr Bld: 6 % — ABNORMAL HIGH (ref 4.8–5.6)

## 2022-08-10 LAB — LIPID PANEL
Chol/HDL Ratio: 3.1 ratio (ref 0.0–5.0)
Cholesterol, Total: 131 mg/dL (ref 100–199)
HDL: 42 mg/dL (ref >39)
LDL Chol Calc (NIH): 66 mg/dL (ref 0–99)
Triglycerides: 133 mg/dL (ref 0–149)
VLDL Cholesterol Cal: 23 mg/dL (ref 5–40)

## 2022-08-10 LAB — VITAMIN B12: Vitamin B-12: 843 pg/mL (ref 232–1245)

## 2022-08-10 NOTE — Progress Notes (Signed)
Kindly inform the patient that thyroid panel, vitamin B12, cholesterol panel and blood chemistries are normal.  Screening test for diabetes results are not back yet

## 2022-08-11 ENCOUNTER — Telehealth: Payer: Self-pay

## 2022-08-11 NOTE — Telephone Encounter (Signed)
-----   Message from Garvin Fila, MD sent at 08/10/2022  8:48 AM EST ----- Kindly inform the patient that thyroid panel, vitamin B12, cholesterol panel and blood chemistries are normal.  Screening test for diabetes results are not back yet

## 2022-08-11 NOTE — Telephone Encounter (Signed)
I called pt and left vm ( ok per dpr) updating on lab results. Advised to call back with any questions/concerns.

## 2022-08-16 NOTE — Telephone Encounter (Signed)
Pt is going on Eliquis because of multiple TIA's.  TRANSCATHETER HEART VALVE DEPLOYMENT:   An Edwards Sapien 3 transcatheter heart valve (size 26 mm, model # 9600TFX, serial #[id number]) is prepared and crimped per manufacturer's guidelines, and the proper orientation of the valve is confirmed on the [employer] delivery system.  The delivery system loader is advanced into the introducing sheath.  The valve and delivery system are advanced through the loader into the sheath and all air is evacuated.  The valve and balloon are advanced into the left ventricle and part way through the aortic valve.   The valve is then finely positioned in the aortic valve.   Once final position of the valve has been confirmed, the valve is deployed while temporarily holding ventilation and during rapid ventricular pacing to maintain systolic blood pressure < 50 mmHg and pulse pressure < 10 mmHg.  The balloon inflation is held for >3 seconds after reaching full deployment volume.  Once the balloon has fully deflated the balloon is retracted into the left ventricle and valve function is assessed using TEE.   There is felt to be no paravalvular leak and no central aortic insufficiency.  Left ventricular function is  unchanged from preoperatively.  There is no mitral regurgitation.  The patient's hemodynamic recovery following valve deployment is good. Mean gradient across the new prosthesis was 5 mm Hg and therefore no further dilation or valve ring fracture was indicated.    The deployment balloon and guidewire are both removed.

## 2022-08-16 NOTE — Telephone Encounter (Signed)
Pt c/o medication issue:  1. Name of Medication:  apixaban (ELIQUIS) 5 MG TABS tablet   2. How are you currently taking this medication (dosage and times per day)?   3. Are you having a reaction (difficulty breathing--STAT)?   4. What is your medication issue?   Patient's wife states she saw a commercial on TV that advised that patient's should not take this medication if they have an artificial heart valve and she is requesting to discuss this with Dr. Julien Nordmann nurse.

## 2022-08-16 NOTE — Telephone Encounter (Signed)
Patient's wife calling back. °

## 2022-08-17 ENCOUNTER — Telehealth: Payer: Self-pay | Admitting: Cardiology

## 2022-08-17 DIAGNOSIS — G459 Transient cerebral ischemic attack, unspecified: Secondary | ICD-10-CM | POA: Diagnosis not present

## 2022-08-17 DIAGNOSIS — I480 Paroxysmal atrial fibrillation: Secondary | ICD-10-CM | POA: Diagnosis not present

## 2022-08-17 DIAGNOSIS — R269 Unspecified abnormalities of gait and mobility: Secondary | ICD-10-CM | POA: Diagnosis not present

## 2022-08-17 NOTE — Telephone Encounter (Signed)
Discussed with Vaughan Basta per DPR that it is safe for pt to take as he is not on Eliquis for the valve but for afib and TIA's. Vaughan Basta verbalized understanding and had no additional questions.

## 2022-08-17 NOTE — Telephone Encounter (Signed)
You did not address the valve question with Dede which is what they are asking. Is Eliquis safe with his valve?

## 2022-08-17 NOTE — Telephone Encounter (Signed)
Wife is calling regarding not hearing back about Eliquis. Advised patient a response from Dr. Geraldo Pitter is still needed and he is currently rounding at the hospital. Wife verbalized understanding and stated they will go another route regarding it.

## 2022-08-25 ENCOUNTER — Institutional Professional Consult (permissible substitution): Payer: PPO | Admitting: Neurology

## 2022-08-25 DIAGNOSIS — R2689 Other abnormalities of gait and mobility: Secondary | ICD-10-CM | POA: Diagnosis not present

## 2022-08-28 ENCOUNTER — Other Ambulatory Visit: Payer: Self-pay | Admitting: Cardiology

## 2022-08-31 DIAGNOSIS — R2689 Other abnormalities of gait and mobility: Secondary | ICD-10-CM | POA: Diagnosis not present

## 2022-09-02 DIAGNOSIS — R2689 Other abnormalities of gait and mobility: Secondary | ICD-10-CM | POA: Diagnosis not present

## 2022-09-08 DIAGNOSIS — R2689 Other abnormalities of gait and mobility: Secondary | ICD-10-CM | POA: Diagnosis not present

## 2022-09-10 DIAGNOSIS — R260 Ataxic gait: Secondary | ICD-10-CM | POA: Diagnosis not present

## 2022-09-10 DIAGNOSIS — R269 Unspecified abnormalities of gait and mobility: Secondary | ICD-10-CM | POA: Diagnosis not present

## 2022-09-10 DIAGNOSIS — G250 Essential tremor: Secondary | ICD-10-CM | POA: Diagnosis not present

## 2022-09-10 DIAGNOSIS — G459 Transient cerebral ischemic attack, unspecified: Secondary | ICD-10-CM | POA: Diagnosis not present

## 2022-09-10 DIAGNOSIS — H8193 Unspecified disorder of vestibular function, bilateral: Secondary | ICD-10-CM | POA: Diagnosis not present

## 2022-09-10 DIAGNOSIS — M5137 Other intervertebral disc degeneration, lumbosacral region: Secondary | ICD-10-CM | POA: Diagnosis not present

## 2022-09-10 DIAGNOSIS — R2689 Other abnormalities of gait and mobility: Secondary | ICD-10-CM | POA: Diagnosis not present

## 2022-09-14 ENCOUNTER — Telehealth: Payer: Self-pay | Admitting: Neurology

## 2022-09-14 MED ORDER — GABAPENTIN 100 MG PO CAPS: ORAL_CAPSULE | ORAL | 6 refills | Status: DC

## 2022-09-14 NOTE — Addendum Note (Signed)
Addended by: Genia Harold on: 09/14/2022 03:14 PM   Modules accepted: Orders

## 2022-09-14 NOTE — Telephone Encounter (Signed)
Called the wife back and discussed the concerns further. Pt saw Dr Leonie Man for second opionion and both Dr Billey Gosling and Dr Leonie Man discussed the patient's ataxia in ov notes. He is working with PT  twice a week and will have 4th session tomorrow. There has not been much noticeable improvement from that.  She is asking about whether there is a medication that he can take that helps with balance. He has some tremors and balance concerns and would like to start something to help.  He has meclizine on file that he uses it he has a flare up with meniere's dx. She mentioned a medication Skylarys and I advised that I am unaware of any of the neurologist using this medication and when I looked it up it appears that it is for patients with a particular genetic dx in relation to ataxia. Advised I would see what Dr Billey Gosling recommends since he is still working with PT.  She asked if we can't get them on home phone to call her cell, [phone]

## 2022-09-14 NOTE — Telephone Encounter (Signed)
His balance issues are likely caused by his lower back issues and his old cerebellar stroke. Unfortunately there isn't much treatment for this other than physical therapy. He may benefit from seeing ENT again to see if there are other medications he can use for Meniere's disease. I can place a referral for ENT for him if he would like.   Treatment options for his tremor are limited because many of the tremor medications interact with his current meds. We can try daily gabapentin which may help with the tremor. I'll send in a prescription to his pharmacy.

## 2022-09-14 NOTE — Telephone Encounter (Signed)
Wife is asking if any medication can be called in for pt's diagnosis of Ataxia, please call

## 2022-09-14 NOTE — Telephone Encounter (Signed)
Called the patient's wife back. Advised the wife that there is not a lot of medication that is prescribed for ataxia concerns. Advised his balance and ataxia concerns are likely related to old cerebellar stroke and lower back concerns. Advised that PT is best treatment option and advised that PT should be neuro rehab focused. The wife states he has PT apt tomorrow and will discuss that with them tomorrow on whether they focus on neuro rehab. I advised of the gabapentin being called in that may help with tremor. The wife states Dr Leonie Man had called in topiramate to help with that. He hasn't been on that medication long. Informed her that she can tell pharmacy to hold off on filling the gabapentin to allow the topiramate more time to work.

## 2022-09-15 DIAGNOSIS — R2689 Other abnormalities of gait and mobility: Secondary | ICD-10-CM | POA: Diagnosis not present

## 2022-09-15 DIAGNOSIS — L300 Nummular dermatitis: Secondary | ICD-10-CM | POA: Diagnosis not present

## 2022-09-15 DIAGNOSIS — L820 Inflamed seborrheic keratosis: Secondary | ICD-10-CM | POA: Diagnosis not present

## 2022-09-17 DIAGNOSIS — R2689 Other abnormalities of gait and mobility: Secondary | ICD-10-CM | POA: Diagnosis not present

## 2022-09-21 DIAGNOSIS — R2689 Other abnormalities of gait and mobility: Secondary | ICD-10-CM | POA: Diagnosis not present

## 2022-09-24 DIAGNOSIS — R2689 Other abnormalities of gait and mobility: Secondary | ICD-10-CM | POA: Diagnosis not present

## 2022-09-27 DIAGNOSIS — R2689 Other abnormalities of gait and mobility: Secondary | ICD-10-CM | POA: Diagnosis not present

## 2022-09-30 ENCOUNTER — Other Ambulatory Visit: Payer: Self-pay

## 2022-09-30 DIAGNOSIS — R2689 Other abnormalities of gait and mobility: Secondary | ICD-10-CM | POA: Diagnosis not present

## 2022-09-30 MED ORDER — APIXABAN 5 MG PO TABS: 5.0000 mg | ORAL_TABLET | Freq: Two times a day (BID) | ORAL | 0 refills | Status: DC

## 2022-10-04 DIAGNOSIS — R2689 Other abnormalities of gait and mobility: Secondary | ICD-10-CM | POA: Diagnosis not present

## 2022-10-07 ENCOUNTER — Other Ambulatory Visit: Payer: Self-pay

## 2022-10-07 DIAGNOSIS — F5104 Psychophysiologic insomnia: Secondary | ICD-10-CM | POA: Insufficient documentation

## 2022-10-07 DIAGNOSIS — F32A Depression, unspecified: Secondary | ICD-10-CM | POA: Insufficient documentation

## 2022-10-07 DIAGNOSIS — H8102 Meniere's disease, left ear: Secondary | ICD-10-CM | POA: Insufficient documentation

## 2022-10-07 DIAGNOSIS — N189 Chronic kidney disease, unspecified: Secondary | ICD-10-CM | POA: Insufficient documentation

## 2022-10-07 DIAGNOSIS — R2689 Other abnormalities of gait and mobility: Secondary | ICD-10-CM | POA: Diagnosis not present

## 2022-10-07 DIAGNOSIS — R413 Other amnesia: Secondary | ICD-10-CM | POA: Insufficient documentation

## 2022-10-07 DIAGNOSIS — R270 Ataxia, unspecified: Secondary | ICD-10-CM | POA: Insufficient documentation

## 2022-10-07 DIAGNOSIS — G459 Transient cerebral ischemic attack, unspecified: Secondary | ICD-10-CM | POA: Insufficient documentation

## 2022-10-11 DIAGNOSIS — R2689 Other abnormalities of gait and mobility: Secondary | ICD-10-CM | POA: Diagnosis not present

## 2022-10-13 ENCOUNTER — Ambulatory Visit: Payer: PPO | Attending: Cardiology | Admitting: Cardiology

## 2022-10-13 ENCOUNTER — Encounter: Payer: Self-pay | Admitting: Cardiology

## 2022-10-13 VITALS — BP 110/70 | HR 78 | Ht 69.0 in | Wt 187.0 lb

## 2022-10-13 DIAGNOSIS — R42 Dizziness and giddiness: Secondary | ICD-10-CM | POA: Diagnosis not present

## 2022-10-13 DIAGNOSIS — G459 Transient cerebral ischemic attack, unspecified: Secondary | ICD-10-CM | POA: Diagnosis not present

## 2022-10-13 DIAGNOSIS — Z9889 Other specified postprocedural states: Secondary | ICD-10-CM | POA: Diagnosis not present

## 2022-10-13 DIAGNOSIS — I700 Atherosclerosis of aorta: Secondary | ICD-10-CM

## 2022-10-13 DIAGNOSIS — I10 Essential (primary) hypertension: Secondary | ICD-10-CM | POA: Diagnosis not present

## 2022-10-13 DIAGNOSIS — Z952 Presence of prosthetic heart valve: Secondary | ICD-10-CM

## 2022-10-13 DIAGNOSIS — I480 Paroxysmal atrial fibrillation: Secondary | ICD-10-CM | POA: Diagnosis not present

## 2022-10-13 DIAGNOSIS — E782 Mixed hyperlipidemia: Secondary | ICD-10-CM

## 2022-10-13 HISTORY — DX: Mixed hyperlipidemia: E78.2

## 2022-10-13 NOTE — Progress Notes (Signed)
Cardiology Office Note:    Date:  10/13/2022   ID:  Randall Edwards, DOB 09/25/1945, MRN [id number]  PCP:  Nicoletta Dress, MD  Cardiologist:  Jenean Lindau, MD   Referring MD: Nicoletta Dress, MD    ASSESSMENT:    1. PAF (paroxysmal atrial fibrillation) ([place])   2. Essential hypertension   3. TIA (transient ischemic attack)   4. s/p valve-in-valve TAVR   5. S/P aorta repair   6. Vertigo   7. Aortic atherosclerosis ([place])   8. Mixed dyslipidemia    PLAN:    In order of problems listed above:  Aortic atherosclerosis: Secondary prevention stressed with the patient.  Importance of compliance with diet medication stressed any vocalized understanding. Essential hypertension: Blood pressure is stable and diet was emphasized. Aortic stenosis status post TAVR: Stable at this time.  Echo report discussed with the patient at length. Paroxysmal atrial fibrillation:I discussed with the patient atrial fibrillation, disease process. Management and therapy including rate and rhythm control, anticoagulation benefits and potential risks were discussed extensively with the patient. Patient had multiple questions which were answered to patient's satisfaction.  I had a lengthy discussion with the patient and his wife.  The decision of discontinuing anticoagulation is very tricky.  Fortunately he has not had a fall since he has been using a walker since November.  I told him to be monitoring the situation very closely.  Fall precautions were advised extensively.  If he falls against then they will give me a call and we will stop the anticoagulation.  They understand.  I will give him a choice of stopping anticoagulation and its adverse effects.  They are vehemently against stopping it now and want to continue it since he has not had a fall in since November.  I respect her wishes. Mixed dyslipidemia: On statin therapy managed by primary care. Dementia: Mild it appears to me.  He was able to engage  himself in a conversation with me. Patient will be seen in follow-up appointment in 4 months or earlier if the patient has any concerns    Medication Adjustments/Labs and Tests Ordered: Current medicines are reviewed at length with the patient today.  Concerns regarding medicines are outlined above.  No orders of the defined types were placed in this encounter.  No orders of the defined types were placed in this encounter.    No chief complaint on file.    History of Present Illness:    Randall Edwards is a 77 y.o. male.  Patient has past medical history of aortic arterial sclerosis, post aortic valve replacement with TAVR, essential hypertension, dyslipidemia and paroxysmal atrial fibrillation.  He has been recently diagnosed with multiple strokes.  He is unstable on his gait and has had falls in the past.  His wife is very supportive.  She mentions to me that since he is walking using a walker at home since November he has not had any falls.  No chest pain orthopnea or PND.  At the time of my evaluation, the patient is alert awake oriented and in no distress.  Past Medical History:  Diagnosis Date   Aortic atherosclerosis ([place]) 06/18/2021   Noted on 2020 CTA   Ascending aorta dilatation ([facility]) 05/16/2019   Ataxia    Atrial tachycardia    s/p ablation and on sotolol   BPH (benign prostatic hyperplasia)    Chronic frontal sinusitis 09/12/2014   Chronic insomnia    Chronic sphenoidal sinusitis 09/12/2014  CKD (chronic kidney disease)    Depression    Dyslipidemia 03/18/2015   Essential hypertension 03/18/2015   GERD (gastroesophageal reflux disease)    H/O cardiac radiofrequency ablation 08/2016   Hyperlipidemia    Hypertension    Hypertrophy of both inferior nasal turbinates 09/12/2014   Memory loss    Meniere's disease of left ear    Multiple nasal polyps 09/12/2014   Nasal septal deviation 09/12/2014   PAF (paroxysmal atrial fibrillation) ([place]) 12/17/2020   S/P aorta  repair    S/P AVR (aortic valve replacement) 2010   s/p valve-in-valve TAVR 07/17/2019   s/p VIV TAVR via the TA approach after failed TF case 07/03/19   Severe aortic stenosis 05/16/2019   Status post radiofrequency ablation for arrhythmia 03/18/2015   TIA (transient ischemic attack)    Vertigo     Past Surgical History:  Procedure Laterality Date   ABLATION OF DYSRHYTHMIC FOCUS     CARDIAC VALVE REPLACEMENT     CHOLECYSTECTOMY     KIDNEY STONE SURGERY     KNEE ARTHROSCOPY Bilateral    MASS EXCISION Right 12/26/2017   Procedure: RIGHT INDEX EXCISION CYST AND DEBRIDEMENT OF DISTAL INTERPHALANGEAL JOINT;  Surgeon: Leanora Cover, MD;  Location: [facility];  Service: Orthopedics;  Laterality: Right;  Bier block   RIGHT HEART CATH AND CORONARY ANGIOGRAPHY N/A 06/20/2019   Procedure: RIGHT HEART CATH AND CORONARY ANGIOGRAPHY;  Surgeon: Sherren Mocha, MD;  Location: [facility];  Service: Cardiovascular;  Laterality: N/A;   TEE WITHOUT CARDIOVERSION N/A 07/03/2019   Procedure: TRANSESOPHAGEAL ECHOCARDIOGRAM (TEE);  Surgeon: Sherren Mocha, MD;  Location: [facility];  Service: Open Heart Surgery;  Laterality: N/A;   TEE WITHOUT CARDIOVERSION N/A 07/17/2019   Procedure: TRANSESOPHAGEAL ECHOCARDIOGRAM (TEE);  Surgeon: Sherren Mocha, MD;  Location: [facility];  Service: Open Heart Surgery;  Laterality: N/A;   TRANSCATHETER AORTIC VALVE REPLACEMENT, TRANSAPICAL N/A 07/17/2019   Procedure: TRANSCATHETER AORTIC VALVE REPLACEMENT, TRANSAPICAL;  Surgeon: Sherren Mocha, MD;  Location: [facility];  Service: Open Heart Surgery;  Laterality: N/A;   TRANSCATHETER AORTIC VALVE REPLACEMENT, TRANSFEMORAL N/A 07/03/2019   Procedure: attempted TRANSCATHETER AORTIC VALVE REPLACEMENT, TRANSFEMORAL;  Surgeon: Sherren Mocha, MD;  Location: [facility];  Service: Open Heart Surgery;  Laterality: N/A;    Current Medications: Current Meds  Medication Sig   apixaban (ELIQUIS) 5 MG TABS tablet Take 1 tablet (5  mg total) by mouth 2 (two) times daily.   cyanocobalamin (VITAMIN B12) 1000 MCG tablet Take 1,000 mcg by mouth daily.   diazepam (VALIUM) 2 MG tablet Take 2 mg by mouth 2 (two) times daily.   DULoxetine (CYMBALTA) 60 MG capsule Take 60 mg by mouth daily.   meclizine (ANTIVERT) 25 MG tablet Take 25 mg by mouth 3 (three) times daily as needed for dizziness.   meloxicam (MOBIC) 7.5 MG tablet Take 7.5 mg by mouth as needed for pain.   methocarbamol (ROBAXIN) 500 MG tablet Take 500 mg by mouth as needed for muscle spasms.   pantoprazole (PROTONIX) 40 MG tablet Take 40 mg by mouth daily.   potassium chloride (KLOR-CON) 10 MEQ tablet Take 10 mEq by mouth daily.   rosuvastatin (CRESTOR) 10 MG tablet TAKE ONE TABLET BY MOUTH EVERY DAY IN THE EVENING   sotalol (BETAPACE) 80 MG tablet Take 1 tablet (80 mg total) by mouth 2 (two) times daily.   temazepam (RESTORIL) 15 MG capsule Take 15 mg by mouth at bedtime.    topiramate (TOPAMAX) 25  MG tablet Take 1 tablet (25 mg total) by mouth 2 (two) times daily.   traMADol (ULTRAM) 50 MG tablet Take 50 mg by mouth every 6 (six) hours as needed for pain (back pain).   triamterene-hydrochlorothiazide (MAXZIDE-25) 37.5-25 MG tablet TAKE ONE TABLET BY MOUTH EVERY DAY     Allergies:   Patient has no known allergies.   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  ROS:   Please see the history of present illness.    All other systems reviewed and are negative.  EKGs/Labs/Other Studies Reviewed:    The following studies were reviewed today: I discussed my findings with the patient at length   Recent Labs: 08/09/2022: ALT 21; BUN 10; Creatinine, Ser 1.08; Hemoglobin 12.8; Platelets 202; Potassium 4.2; Sodium 141; TSH 2.420  Recent Lipid Panel    Component Value Date/Time   CHOL 131 08/09/2022 1059   TRIG 133 08/09/2022 1059   HDL 42 08/09/2022 1059   CHOLHDL 3.1 08/09/2022 1059   LDLCALC 66 08/09/2022 1059    Physical Exam:    VS:  BP 110/70   Pulse 78   Ht '5\' 9"'$  (1.753 m)   Wt 187 lb (84.8 kg)   SpO2 97%   BMI 27.62 kg/m     Wt Readings from Last 3 Encounters:  10/13/22 187 lb (84.8 kg)  08/09/22 191 lb 8 oz (86.9 kg)  07/06/22 191 lb 4 oz (86.8 kg)     GEN: Patient is in no acute distress HEENT: Normal NECK: No JVD; No carotid bruits LYMPHATICS: No lymphadenopathy CARDIAC: Hear sounds regular, 2/6 systolic murmur at the apex. RESPIRATORY:  Clear to auscultation without rales, wheezing or rhonchi  ABDOMEN: Soft, non-tender, non-distended MUSCULOSKELETAL:  No edema; No deformity  SKIN: Warm and dry NEUROLOGIC:  Alert and oriented x 3 PSYCHIATRIC:  Normal affect   Signed, Jenean Lindau, MD  10/13/2022 1:55 PM    [facility]

## 2022-10-13 NOTE — Patient Instructions (Signed)

## 2022-10-14 DIAGNOSIS — R2689 Other abnormalities of gait and mobility: Secondary | ICD-10-CM | POA: Diagnosis not present

## 2022-10-18 DIAGNOSIS — R2689 Other abnormalities of gait and mobility: Secondary | ICD-10-CM | POA: Diagnosis not present

## 2022-10-20 DIAGNOSIS — K449 Diaphragmatic hernia without obstruction or gangrene: Secondary | ICD-10-CM | POA: Diagnosis not present

## 2022-10-20 DIAGNOSIS — Z8673 Personal history of transient ischemic attack (TIA), and cerebral infarction without residual deficits: Secondary | ICD-10-CM | POA: Diagnosis not present

## 2022-10-20 DIAGNOSIS — I480 Paroxysmal atrial fibrillation: Secondary | ICD-10-CM | POA: Diagnosis not present

## 2022-10-20 DIAGNOSIS — E785 Hyperlipidemia, unspecified: Secondary | ICD-10-CM | POA: Diagnosis not present

## 2022-10-20 DIAGNOSIS — Z79899 Other long term (current) drug therapy: Secondary | ICD-10-CM | POA: Diagnosis not present

## 2022-10-20 DIAGNOSIS — R972 Elevated prostate specific antigen [PSA]: Secondary | ICD-10-CM | POA: Diagnosis not present

## 2022-10-20 DIAGNOSIS — Z9181 History of falling: Secondary | ICD-10-CM | POA: Diagnosis not present

## 2022-10-20 DIAGNOSIS — F5104 Psychophysiologic insomnia: Secondary | ICD-10-CM | POA: Diagnosis not present

## 2022-10-20 DIAGNOSIS — I10 Essential (primary) hypertension: Secondary | ICD-10-CM | POA: Diagnosis not present

## 2022-10-20 DIAGNOSIS — K219 Gastro-esophageal reflux disease without esophagitis: Secondary | ICD-10-CM | POA: Diagnosis not present

## 2022-10-20 DIAGNOSIS — R0989 Other specified symptoms and signs involving the circulatory and respiratory systems: Secondary | ICD-10-CM | POA: Diagnosis not present

## 2022-10-20 DIAGNOSIS — H8102 Meniere's disease, left ear: Secondary | ICD-10-CM | POA: Diagnosis not present

## 2022-10-21 DIAGNOSIS — R2689 Other abnormalities of gait and mobility: Secondary | ICD-10-CM | POA: Diagnosis not present

## 2022-10-25 DIAGNOSIS — R2689 Other abnormalities of gait and mobility: Secondary | ICD-10-CM | POA: Diagnosis not present

## 2022-10-29 DIAGNOSIS — R2689 Other abnormalities of gait and mobility: Secondary | ICD-10-CM | POA: Diagnosis not present

## 2022-11-03 DIAGNOSIS — R2689 Other abnormalities of gait and mobility: Secondary | ICD-10-CM | POA: Diagnosis not present

## 2022-11-05 DIAGNOSIS — R2689 Other abnormalities of gait and mobility: Secondary | ICD-10-CM | POA: Diagnosis not present

## 2022-11-08 ENCOUNTER — Telehealth: Payer: Self-pay | Admitting: Psychiatry

## 2022-11-08 DIAGNOSIS — R2689 Other abnormalities of gait and mobility: Secondary | ICD-10-CM | POA: Diagnosis not present

## 2022-11-08 NOTE — Telephone Encounter (Signed)
Pt wife is calling stating she wants to let Dr. Wynelle Link know that pt is going to ask her about driving tomorrow. She states pt isn't ready to drive and she don't want him to know she called about him driving.

## 2022-11-08 NOTE — Telephone Encounter (Signed)
Please see the below. Patient scheduled tomorrow to see you at 10:30am. Just FYI.

## 2022-11-09 ENCOUNTER — Ambulatory Visit: Payer: PPO | Admitting: Psychiatry

## 2022-11-09 ENCOUNTER — Encounter: Payer: Self-pay | Admitting: Psychiatry

## 2022-11-09 VITALS — BP 106/74 | HR 78 | Ht 69.0 in | Wt 183.0 lb

## 2022-11-09 DIAGNOSIS — G459 Transient cerebral ischemic attack, unspecified: Secondary | ICD-10-CM

## 2022-11-09 DIAGNOSIS — R2689 Other abnormalities of gait and mobility: Secondary | ICD-10-CM

## 2022-11-09 DIAGNOSIS — R413 Other amnesia: Secondary | ICD-10-CM | POA: Diagnosis not present

## 2022-11-09 DIAGNOSIS — G250 Essential tremor: Secondary | ICD-10-CM

## 2022-11-09 MED ORDER — TOPIRAMATE 25 MG PO TABS: ORAL_TABLET | ORAL | 6 refills | Status: DC

## 2022-11-09 NOTE — Patient Instructions (Addendum)
Increase Topamax to 1 pill in the morning and 2 pills at night for one week, then increase to 2 pills twice a day

## 2022-11-09 NOTE — Progress Notes (Signed)
CC:  imbalance, TIA  Follow-up Visit  Last visit: 08/09/22  Brief HPI: 77 year old male with a history of CAD s/p CABG, HTN, aortic stenosis s/p AVR, afib s/p ablation, GERD, HLD, TIA/CVA, Meniere's disease, lumbar stenosis who follows in clinic for an episode of altered mental status and imbalance.  At his last visit, EEG was ordered. He was referred to the stroke team for [facility] management in the setting of possible TIA.  Interval History: He saw Dr. Leonie Man from the stroke team in November 2023, who switched him from [place] to [place]. He was also started on a trial of Topamax 25 mg BID for essential tremor. He is tolerating this well, but has not noticed any improvement in his tremor since starting it.  He is currently in PT for gait and balance, which has been helpful. He is currently using a walker and has not had any recent falls. Denies vertigo or back pain.  He notes forgetfulness which has been worsening over the past few months. Will forget acquaintances names, does not forget friends or family members names. He was starting to get confused with his medications so his wife took over. Wife is handling the finances as well. He does not drive.  Physical Exam:   Vital Signs: BP 106/74 (BP Location: Left Arm, Patient Position: Sitting, Cuff Size: Normal)   Pulse 78   Ht '5\' 9"'$  (1.753 m)   Wt 183 lb (83 kg)   BMI 27.02 kg/m  GENERAL:  well appearing, in no acute distress, alert  SKIN:  Color, texture, turgor normal. No rashes or lesions HEAD:  Normocephalic/atraumatic. RESP: normal respiratory effort MSK:  No gross joint deformities.   NEUROLOGICAL: Mental Status:     11/09/2022   10:50 AM  Montreal Cognitive Assessment   Visuospatial/ Executive (0/5) 5  Naming (0/3) 3  Attention: Read list of digits (0/2) 2  Attention: Read list of letters (0/1) 1  Attention: Serial 7 subtraction starting at 100 (0/3) 1  Language: Repeat phrase (0/2) 2  Language : Fluency (0/1) 0   Abstraction (0/2) 1  Delayed Recall (0/5) 0  Orientation (0/6) 3  Total 18   Cranial Nerves: PERRL, face symmetric, no dysarthria, hearing grossly intact Motor: moves all extremities equally. R>L postural tremor present in bilateral upper extremities. Fine finger movements normal without bradykinesia. Gait: stopped posture, narrow-based gait with normal stride length  IMPRESSION: 77 year old male with a history of CAD s/p CABG, HTN, aortic stenosis s/p AVR, afib s/p ablation, GERD, HLD, TIA/CVA, Meniere's disease, lumbar stenosis who presents for follow up of TIA, imbalance, and tremor. He is tolerating Eliquis well without issues. Balance has improved somewhat with PT and he has had no falls since starting to use a walker. He has not noticed any improvement in his tremor. Will uptitrate Topamax, counseled on potential side effects including paresthesias and brain fog. He does report worsening memory issues which have impacted his ADLs. MOCA score today is 18, concerning for MCI vs early dementia. Will monitor for now and repeat memory testing at next visit to assess for stability.  PLAN: CVA/TIA -Continue Eliquis 5 mg BID  Imbalance - multifactorial 2/2 Meniere's disease, lumbar stenosis, prior cerebellar stroke -continue PT for gait/balance  Essential tremor -Increase Topamax to 25/50 x1 week, then if tolerating well can increase to 50 mg BID  Memory loss -MOCA 18/30 -Will plan to repeat memory testing at next appointment, would consider starting a memory medication if cognition continues  to decline  Follow-up: 7 months  I spent a total of 46 minutes on the date of the service. Discussed medication side effects, adverse reactions and drug interactions. Written educational materials and patient instructions outlining all of the above were given.  Genia Harold, MD 11/09/22 11:25 AM

## 2022-11-11 DIAGNOSIS — R2689 Other abnormalities of gait and mobility: Secondary | ICD-10-CM | POA: Diagnosis not present

## 2022-11-16 ENCOUNTER — Telehealth: Payer: Self-pay | Admitting: Cardiology

## 2022-11-16 ENCOUNTER — Other Ambulatory Visit: Payer: Self-pay

## 2022-11-16 DIAGNOSIS — I700 Atherosclerosis of aorta: Secondary | ICD-10-CM

## 2022-11-16 DIAGNOSIS — R2689 Other abnormalities of gait and mobility: Secondary | ICD-10-CM | POA: Diagnosis not present

## 2022-11-16 MED ORDER — APIXABAN 5 MG PO TABS: 5.0000 mg | ORAL_TABLET | Freq: Two times a day (BID) | ORAL | 0 refills | Status: DC

## 2022-11-16 MED ORDER — APIXABAN 5 MG PO TABS: 5.0000 mg | ORAL_TABLET | Freq: Two times a day (BID) | ORAL | 1 refills | Status: DC

## 2022-11-16 NOTE — Telephone Encounter (Signed)
Eliquis '5mg'$  refill request received. Patient is 77 years old, weight-83kg, Crea-1.08 on 08/09/22, Diagnosis-Afib, and last seen by Revankar on 10/13/22. Dose is appropriate based on dosing criteria. Will send in refill to requested pharmacy.

## 2022-11-16 NOTE — Telephone Encounter (Signed)
*  STAT* If patient is at the pharmacy, call can be transferred to refill team.   1. Which medications need to be refilled? (please list name of each medication and dose if known)   apixaban (ELIQUIS) 5 MG TABS tablet    2. Which pharmacy/location (including street and city if local pharmacy) is medication to be sent to?  [place], [place]    3. Do they need a 30 day or 90 day supply? 90 day   Pt spouse stated he's out of medication. Please advise.

## 2022-11-18 ENCOUNTER — Telehealth: Payer: Self-pay

## 2022-11-18 DIAGNOSIS — R2689 Other abnormalities of gait and mobility: Secondary | ICD-10-CM | POA: Diagnosis not present

## 2022-11-18 NOTE — Patient Outreach (Signed)
  Care Coordination   11/18/2022 Name: Randall Edwards MRN: [id number] DOB: 04/06/1946   Care Coordination Outreach Attempts:  An unsuccessful telephone outreach was attempted today to offer the patient information about available care coordination services as a benefit of their health plan.   Follow Up Plan:  Additional outreach attempts will be made to offer the patient care coordination information and services.   Encounter Outcome:  No Answer   Care Coordination Interventions:  No, not indicated    Tomasa Rand, RN, BSN, [facility] [employer] [phone]

## 2022-11-18 NOTE — Patient Outreach (Signed)
  Care Coordination   Initial Visit Note   11/18/2022 Name: Randall Edwards MRN: [id number] DOB: 08-27-1946  Randall Edwards is a 77 y.o. year old male who sees Nicoletta Dress, MD for primary care. I spoke with  [name] by phone today.  What matters to the patients health and wellness today?  Placed call to patient today and spoke with wife who is only HIPAA release to speak with. [name] states that patient is continuing to go to PT twice a week, Wife reports she manages all medications.  Wife denies needs at this time. I provided my contact information and encouraged wife to call me if needed.     SDOH assessments and interventions completed:  No     Care Coordination Interventions:  No, not indicated   Follow up plan: No further intervention required.   Encounter Outcome:  Pt. Refused   Tomasa Rand, RN, BSN, CEN [facility] [employer] [phone]

## 2022-11-22 DIAGNOSIS — R2689 Other abnormalities of gait and mobility: Secondary | ICD-10-CM | POA: Diagnosis not present

## 2022-11-25 DIAGNOSIS — R2689 Other abnormalities of gait and mobility: Secondary | ICD-10-CM | POA: Diagnosis not present

## 2022-11-26 DIAGNOSIS — H903 Sensorineural hearing loss, bilateral: Secondary | ICD-10-CM | POA: Diagnosis not present

## 2022-11-29 DIAGNOSIS — R2689 Other abnormalities of gait and mobility: Secondary | ICD-10-CM | POA: Diagnosis not present

## 2022-11-30 DIAGNOSIS — R55 Syncope and collapse: Secondary | ICD-10-CM | POA: Diagnosis not present

## 2022-11-30 DIAGNOSIS — R609 Edema, unspecified: Secondary | ICD-10-CM | POA: Diagnosis not present

## 2022-11-30 DIAGNOSIS — S0240CA Maxillary fracture, right side, initial encounter for closed fracture: Secondary | ICD-10-CM | POA: Diagnosis not present

## 2022-11-30 DIAGNOSIS — J9811 Atelectasis: Secondary | ICD-10-CM | POA: Diagnosis not present

## 2022-11-30 DIAGNOSIS — G459 Transient cerebral ischemic attack, unspecified: Secondary | ICD-10-CM | POA: Diagnosis not present

## 2022-11-30 DIAGNOSIS — Z043 Encounter for examination and observation following other accident: Secondary | ICD-10-CM | POA: Diagnosis not present

## 2022-11-30 DIAGNOSIS — S0231XA Fracture of orbital floor, right side, initial encounter for closed fracture: Secondary | ICD-10-CM | POA: Diagnosis not present

## 2022-11-30 DIAGNOSIS — I440 Atrioventricular block, first degree: Secondary | ICD-10-CM | POA: Diagnosis not present

## 2022-11-30 DIAGNOSIS — W19XXXA Unspecified fall, initial encounter: Secondary | ICD-10-CM | POA: Diagnosis not present

## 2022-11-30 DIAGNOSIS — R270 Ataxia, unspecified: Secondary | ICD-10-CM | POA: Diagnosis not present

## 2022-11-30 DIAGNOSIS — I639 Cerebral infarction, unspecified: Secondary | ICD-10-CM | POA: Diagnosis not present

## 2022-11-30 DIAGNOSIS — S02841A Fracture of lateral orbital wall, right side, initial encounter for closed fracture: Secondary | ICD-10-CM | POA: Diagnosis not present

## 2022-11-30 DIAGNOSIS — S0990XA Unspecified injury of head, initial encounter: Secondary | ICD-10-CM | POA: Diagnosis not present

## 2022-11-30 DIAGNOSIS — G936 Cerebral edema: Secondary | ICD-10-CM | POA: Diagnosis not present

## 2022-11-30 DIAGNOSIS — I6523 Occlusion and stenosis of bilateral carotid arteries: Secondary | ICD-10-CM | POA: Diagnosis not present

## 2022-11-30 DIAGNOSIS — I6503 Occlusion and stenosis of bilateral vertebral arteries: Secondary | ICD-10-CM | POA: Diagnosis not present

## 2022-11-30 DIAGNOSIS — R9431 Abnormal electrocardiogram [ECG] [EKG]: Secondary | ICD-10-CM | POA: Diagnosis not present

## 2022-11-30 DIAGNOSIS — R410 Disorientation, unspecified: Secondary | ICD-10-CM | POA: Diagnosis not present

## 2022-12-01 DIAGNOSIS — R9431 Abnormal electrocardiogram [ECG] [EKG]: Secondary | ICD-10-CM | POA: Diagnosis not present

## 2022-12-01 DIAGNOSIS — I480 Paroxysmal atrial fibrillation: Secondary | ICD-10-CM | POA: Diagnosis not present

## 2022-12-01 DIAGNOSIS — I129 Hypertensive chronic kidney disease with stage 1 through stage 4 chronic kidney disease, or unspecified chronic kidney disease: Secondary | ICD-10-CM | POA: Diagnosis not present

## 2022-12-01 DIAGNOSIS — R55 Syncope and collapse: Secondary | ICD-10-CM | POA: Diagnosis not present

## 2022-12-01 DIAGNOSIS — R270 Ataxia, unspecified: Secondary | ICD-10-CM | POA: Diagnosis not present

## 2022-12-01 DIAGNOSIS — R251 Tremor, unspecified: Secondary | ICD-10-CM | POA: Diagnosis not present

## 2022-12-01 DIAGNOSIS — F39 Unspecified mood [affective] disorder: Secondary | ICD-10-CM | POA: Diagnosis not present

## 2022-12-01 DIAGNOSIS — Z951 Presence of aortocoronary bypass graft: Secondary | ICD-10-CM | POA: Diagnosis not present

## 2022-12-01 DIAGNOSIS — S0231XA Fracture of orbital floor, right side, initial encounter for closed fracture: Secondary | ICD-10-CM | POA: Diagnosis not present

## 2022-12-01 DIAGNOSIS — H8109 Meniere's disease, unspecified ear: Secondary | ICD-10-CM | POA: Diagnosis not present

## 2022-12-01 DIAGNOSIS — Z952 Presence of prosthetic heart valve: Secondary | ICD-10-CM | POA: Diagnosis not present

## 2022-12-01 DIAGNOSIS — M1990 Unspecified osteoarthritis, unspecified site: Secondary | ICD-10-CM | POA: Diagnosis not present

## 2022-12-01 DIAGNOSIS — R29703 NIHSS score 3: Secondary | ICD-10-CM | POA: Diagnosis not present

## 2022-12-01 DIAGNOSIS — Z79899 Other long term (current) drug therapy: Secondary | ICD-10-CM | POA: Diagnosis not present

## 2022-12-01 DIAGNOSIS — I2510 Atherosclerotic heart disease of native coronary artery without angina pectoris: Secondary | ICD-10-CM | POA: Diagnosis not present

## 2022-12-01 DIAGNOSIS — R260 Ataxic gait: Secondary | ICD-10-CM | POA: Diagnosis not present

## 2022-12-01 DIAGNOSIS — Z8673 Personal history of transient ischemic attack (TIA), and cerebral infarction without residual deficits: Secondary | ICD-10-CM | POA: Diagnosis not present

## 2022-12-01 DIAGNOSIS — I361 Nonrheumatic tricuspid (valve) insufficiency: Secondary | ICD-10-CM | POA: Diagnosis not present

## 2022-12-01 DIAGNOSIS — I340 Nonrheumatic mitral (valve) insufficiency: Secondary | ICD-10-CM | POA: Diagnosis not present

## 2022-12-01 DIAGNOSIS — Z7901 Long term (current) use of anticoagulants: Secondary | ICD-10-CM | POA: Diagnosis not present

## 2022-12-01 DIAGNOSIS — J449 Chronic obstructive pulmonary disease, unspecified: Secondary | ICD-10-CM | POA: Diagnosis not present

## 2022-12-01 DIAGNOSIS — S0240CA Maxillary fracture, right side, initial encounter for closed fracture: Secondary | ICD-10-CM | POA: Diagnosis not present

## 2022-12-01 DIAGNOSIS — N400 Enlarged prostate without lower urinary tract symptoms: Secondary | ICD-10-CM | POA: Diagnosis not present

## 2022-12-01 DIAGNOSIS — I440 Atrioventricular block, first degree: Secondary | ICD-10-CM | POA: Diagnosis not present

## 2022-12-01 DIAGNOSIS — I6389 Other cerebral infarction: Secondary | ICD-10-CM | POA: Diagnosis not present

## 2022-12-01 DIAGNOSIS — K219 Gastro-esophageal reflux disease without esophagitis: Secondary | ICD-10-CM | POA: Diagnosis not present

## 2022-12-01 DIAGNOSIS — I252 Old myocardial infarction: Secondary | ICD-10-CM | POA: Diagnosis not present

## 2022-12-01 DIAGNOSIS — E7800 Pure hypercholesterolemia, unspecified: Secondary | ICD-10-CM | POA: Diagnosis not present

## 2022-12-01 DIAGNOSIS — Z7982 Long term (current) use of aspirin: Secondary | ICD-10-CM | POA: Diagnosis not present

## 2022-12-01 DIAGNOSIS — S02841A Fracture of lateral orbital wall, right side, initial encounter for closed fracture: Secondary | ICD-10-CM | POA: Diagnosis not present

## 2022-12-01 DIAGNOSIS — I639 Cerebral infarction, unspecified: Secondary | ICD-10-CM | POA: Diagnosis not present

## 2022-12-01 DIAGNOSIS — E785 Hyperlipidemia, unspecified: Secondary | ICD-10-CM | POA: Diagnosis not present

## 2022-12-01 DIAGNOSIS — I10 Essential (primary) hypertension: Secondary | ICD-10-CM | POA: Diagnosis not present

## 2022-12-01 DIAGNOSIS — G936 Cerebral edema: Secondary | ICD-10-CM | POA: Diagnosis not present

## 2022-12-01 LAB — LAB REPORT - SCANNED: EGFR: 60

## 2022-12-02 DIAGNOSIS — I340 Nonrheumatic mitral (valve) insufficiency: Secondary | ICD-10-CM

## 2022-12-02 DIAGNOSIS — Z952 Presence of prosthetic heart valve: Secondary | ICD-10-CM | POA: Diagnosis not present

## 2022-12-02 DIAGNOSIS — I6389 Other cerebral infarction: Secondary | ICD-10-CM | POA: Diagnosis not present

## 2022-12-02 DIAGNOSIS — I361 Nonrheumatic tricuspid (valve) insufficiency: Secondary | ICD-10-CM

## 2022-12-02 DIAGNOSIS — I10 Essential (primary) hypertension: Secondary | ICD-10-CM | POA: Diagnosis not present

## 2022-12-02 DIAGNOSIS — E785 Hyperlipidemia, unspecified: Secondary | ICD-10-CM | POA: Diagnosis not present

## 2022-12-02 DIAGNOSIS — I440 Atrioventricular block, first degree: Secondary | ICD-10-CM | POA: Diagnosis not present

## 2022-12-02 LAB — CMP 10231: EGFR: 9

## 2022-12-03 ENCOUNTER — Other Ambulatory Visit: Payer: Self-pay

## 2022-12-03 ENCOUNTER — Telehealth: Payer: Self-pay | Admitting: Cardiology

## 2022-12-03 DIAGNOSIS — E785 Hyperlipidemia, unspecified: Secondary | ICD-10-CM | POA: Diagnosis not present

## 2022-12-03 DIAGNOSIS — R270 Ataxia, unspecified: Secondary | ICD-10-CM | POA: Diagnosis not present

## 2022-12-03 DIAGNOSIS — Q2112 Patent foramen ovale: Secondary | ICD-10-CM | POA: Diagnosis not present

## 2022-12-03 DIAGNOSIS — I480 Paroxysmal atrial fibrillation: Secondary | ICD-10-CM | POA: Diagnosis not present

## 2022-12-03 DIAGNOSIS — I639 Cerebral infarction, unspecified: Secondary | ICD-10-CM | POA: Diagnosis not present

## 2022-12-03 DIAGNOSIS — S02841S Fracture of lateral orbital wall, right side, sequela: Secondary | ICD-10-CM | POA: Diagnosis not present

## 2022-12-03 DIAGNOSIS — I10 Essential (primary) hypertension: Secondary | ICD-10-CM | POA: Diagnosis not present

## 2022-12-03 DIAGNOSIS — H8102 Meniere's disease, left ear: Secondary | ICD-10-CM | POA: Diagnosis not present

## 2022-12-03 NOTE — Telephone Encounter (Signed)
Called patient's wife Vaughan Basta and she stated that the patient was in the hospital and had 2 strokes and was recently discharged. The hospital wanted the patient to schedule a follow up appointment with Dr. Geraldo Pitter to discuss the [facility] device. An appointment was scheduled for the patient to see Dr. Geraldo Pitter on 12/09/22 at 1pm. Patient's wife was agreeable with this plan and had no further questions at this time.

## 2022-12-03 NOTE — Telephone Encounter (Signed)
Pt wife called in stating pt needs a hospital f/u because pt had a stroke. Informed her the first I have in [place] is 5/13. I offered waitlist, an earlier appt in [place], and [place] with APP, she refused and asked to speak with RN.

## 2022-12-06 ENCOUNTER — Other Ambulatory Visit: Payer: Self-pay

## 2022-12-06 DIAGNOSIS — W19XXXA Unspecified fall, initial encounter: Secondary | ICD-10-CM | POA: Diagnosis not present

## 2022-12-06 DIAGNOSIS — J342 Deviated nasal septum: Secondary | ICD-10-CM | POA: Diagnosis not present

## 2022-12-06 DIAGNOSIS — S02401A Maxillary fracture, unspecified, initial encounter for closed fracture: Secondary | ICD-10-CM | POA: Diagnosis not present

## 2022-12-09 ENCOUNTER — Ambulatory Visit: Payer: PPO | Attending: Cardiology | Admitting: Cardiology

## 2022-12-09 ENCOUNTER — Encounter: Payer: Self-pay | Admitting: Cardiology

## 2022-12-09 ENCOUNTER — Telehealth: Payer: Self-pay

## 2022-12-09 VITALS — BP 134/78 | HR 76 | Ht 69.0 in | Wt 180.2 lb

## 2022-12-09 DIAGNOSIS — E782 Mixed hyperlipidemia: Secondary | ICD-10-CM

## 2022-12-09 DIAGNOSIS — I700 Atherosclerosis of aorta: Secondary | ICD-10-CM

## 2022-12-09 DIAGNOSIS — H8102 Meniere's disease, left ear: Secondary | ICD-10-CM | POA: Diagnosis not present

## 2022-12-09 DIAGNOSIS — I480 Paroxysmal atrial fibrillation: Secondary | ICD-10-CM | POA: Diagnosis not present

## 2022-12-09 DIAGNOSIS — I639 Cerebral infarction, unspecified: Secondary | ICD-10-CM

## 2022-12-09 DIAGNOSIS — W19XXXA Unspecified fall, initial encounter: Secondary | ICD-10-CM | POA: Insufficient documentation

## 2022-12-09 DIAGNOSIS — Z952 Presence of prosthetic heart valve: Secondary | ICD-10-CM | POA: Diagnosis not present

## 2022-12-09 DIAGNOSIS — I10 Essential (primary) hypertension: Secondary | ICD-10-CM

## 2022-12-09 HISTORY — DX: Unspecified fall, initial encounter: W19.XXXA

## 2022-12-09 HISTORY — DX: Cerebral infarction, unspecified: I63.9

## 2022-12-09 NOTE — Patient Instructions (Signed)
Medication Instructions:  Your physician recommends that you continue on your current medications as directed. Please refer to the Current Medication list given to you today.  *If you need a refill on your cardiac medications before your next appointment, please call your pharmacy*   Lab Work: None ordered If you have labs (blood work) drawn today and your tests are completely normal, you will receive your results only by: MyChart Message (if you have MyChart) OR A paper copy in the mail If you have any lab test that is abnormal or we need to change your treatment, we will call you to review the results.   Testing/Procedures: None ordered   Follow-Up: At [facility], you and your health needs are our priority.  As part of our continuing mission to provide you with exceptional heart care, we have created designated Provider Care Teams.  These Care Teams include your primary Cardiologist (physician) and Advanced Practice Providers (APPs -  Physician Assistants and Nurse Practitioners) who all work together to provide you with the care you need, when you need it.  We recommend signing up for the patient portal called "MyChart".  Sign up information is provided on this After Visit Summary.  MyChart is used to connect with patients for Virtual Visits (Telemedicine).  Patients are able to view lab/test results, encounter notes, upcoming appointments, etc.  Non-urgent messages can be sent to your provider as well.   To learn more about what you can do with MyChart, go to [email or web address].    Your next appointment:   6 month(s)  The format for your next appointment:   In Person  Provider:   Rajan Revankar, MD    Other Instructions none  Important Information About Sugar       

## 2022-12-09 NOTE — Progress Notes (Signed)
Cardiology Office Note:    Date:  12/09/2022   ID:  Randall Edwards, DOB 08-11-1946, MRN [id number]  PCP:  Nicoletta Dress, MD  Cardiologist:  Jenean Lindau, MD   Referring MD: Nicoletta Dress, MD    ASSESSMENT:    1. Aortic atherosclerosis ([place])   2. Essential hypertension   3. PAF (paroxysmal atrial fibrillation) (HCC)   4. s/p valve-in-valve TAVR   5. S/P AVR (aortic valve replacement)   6. Mixed dyslipidemia    PLAN:    In order of problems listed above:  Secondary prevention stressed with the patient.  Importance of compliance with diet medication stressed and vocalized understanding. Essential hypertension: Blood pressure stable and diet was emphasized. Paroxysmal atrial fibrillation:I discussed with the patient atrial fibrillation, disease process. Management and therapy including rate and rhythm control, anticoagulation benefits and potential risks were discussed extensively with the patient. Patient had multiple questions which were answered to patient's satisfaction.  In view of the fall and unstable gait he needs to be considered for Watchman device placement.  He has been told in the emergency room post evaluation of his brain that he has had 2 strokes in the past several months.  Therefore I agree with the fact that I do not want to take him off anticoagulation at this time.  He is using a walker and I had to have a discussion with him and his wife and son about fall prevention and they agree.  Will set him up for a Watchman device evaluation. [place] Referral for Left Atrial Appendage Closure with Non-Valvular Atrial Fibrillation   Randall Edwards is a 77 y.o. male is being referred to the [facility] Team for evaluation for Left Atrial Appendage Closure with Watchman device for the management of stroke risk resulting form non-valvular atrial fibrillation.    Base upon Randall Edwards history, he is felt to be a poor candidate for long-term  anticoagulation because of a high risk of recurrent falls.  The patient has a HAS-BLED score of   indicating a Yearly Major Bleeding Risk of  %.      His CHADS2-VASc Score is   with an unadjusted Ischemic Stroke Rate (% per year) of  %.    His stroke risk necessitates a strategy of stroke prevention with either long-term oral anticoagulation or left atrial appendage occlusion therapy. We have discussed their bleeding risk in the context of their comorbid medical problems, as well as the rationale for referral for evaluation of Watchman left atrial appendage occlusion therapy. While the patient is at high long-term bleeding risk, they may be appropriate for short-term anticoagulation. Based on this individual patient's stroke and bleeding risk, a shared decision has been made to refer the patient for consideration of Watchman left atrial appendage closure utilizing the [employer] of Cardiology shared decision tool.    Medication Adjustments/Labs and Tests Ordered: Current medicines are reviewed at length with the patient today.  Concerns regarding medicines are outlined above.  No orders of the defined types were placed in this encounter.  No orders of the defined types were placed in this encounter.    No chief complaint on file.    History of Present Illness:    Randall Edwards is a 77 y.o. male.  Patient has past medical history of paroxysmal fibrillation, essential hypertension, dyslipidemia, aortic valve stenosis post replacement and then subsequently had a TAVR.  He denies any problems at this time  and takes care of activities of daily living.  His only problem is that he is significantly issues with gait.  He fell down recently and had a fracture of the bones of his skull.  He went to the emergency room and I reviewed those records.  No chest pain orthopnea or PND.  He is getting physical therapy to get strength overall.  Past Medical History:  Diagnosis Date   Aortic  atherosclerosis ([place]) 06/18/2021   Noted on 2020 CTA   Ascending aorta dilatation (HCC) 05/16/2019   Ataxia    Atrial tachycardia    s/p ablation and on sotolol   BPH (benign prostatic hyperplasia)    Chronic frontal sinusitis 09/12/2014   Chronic insomnia    Chronic sphenoidal sinusitis 09/12/2014   CKD (chronic kidney disease)    Depression    Dyslipidemia 03/18/2015   Essential hypertension 03/18/2015   GERD (gastroesophageal reflux disease)    H/O cardiac radiofrequency ablation 08/2016   Hyperlipidemia    Hypertension    Hypertrophy of both inferior nasal turbinates 09/12/2014   Memory loss    Meniere's disease of left ear    Mixed dyslipidemia 10/13/2022   Multiple nasal polyps 09/12/2014   Nasal septal deviation 09/12/2014   PAF (paroxysmal atrial fibrillation) ([place]) 12/17/2020   S/P aorta repair    S/P AVR (aortic valve replacement) 2010   s/p valve-in-valve TAVR 07/17/2019   s/p VIV TAVR via the TA approach after failed TF case 07/03/19   Severe aortic stenosis 05/16/2019   Status post radiofrequency ablation for arrhythmia 03/18/2015   TIA (transient ischemic attack)    Vertigo     Past Surgical History:  Procedure Laterality Date   ABLATION OF DYSRHYTHMIC FOCUS     CARDIAC VALVE REPLACEMENT     CHOLECYSTECTOMY     KIDNEY STONE SURGERY     KNEE ARTHROSCOPY Bilateral    MASS EXCISION Right 12/26/2017   Procedure: RIGHT INDEX EXCISION CYST AND DEBRIDEMENT OF DISTAL INTERPHALANGEAL JOINT;  Surgeon: Leanora Cover, MD;  Location: [facility];  Service: Orthopedics;  Laterality: Right;  Bier block   RIGHT HEART CATH AND CORONARY ANGIOGRAPHY N/A 06/20/2019   Procedure: RIGHT HEART CATH AND CORONARY ANGIOGRAPHY;  Surgeon: Sherren Mocha, MD;  Location: [facility];  Service: Cardiovascular;  Laterality: N/A;   TEE WITHOUT CARDIOVERSION N/A 07/03/2019   Procedure: TRANSESOPHAGEAL ECHOCARDIOGRAM (TEE);  Surgeon: Sherren Mocha, MD;  Location: [facility];   Service: Open Heart Surgery;  Laterality: N/A;   TEE WITHOUT CARDIOVERSION N/A 07/17/2019   Procedure: TRANSESOPHAGEAL ECHOCARDIOGRAM (TEE);  Surgeon: Sherren Mocha, MD;  Location: [facility];  Service: Open Heart Surgery;  Laterality: N/A;   TRANSCATHETER AORTIC VALVE REPLACEMENT, TRANSAPICAL N/A 07/17/2019   Procedure: TRANSCATHETER AORTIC VALVE REPLACEMENT, TRANSAPICAL;  Surgeon: Sherren Mocha, MD;  Location: [facility];  Service: Open Heart Surgery;  Laterality: N/A;   TRANSCATHETER AORTIC VALVE REPLACEMENT, TRANSFEMORAL N/A 07/03/2019   Procedure: attempted TRANSCATHETER AORTIC VALVE REPLACEMENT, TRANSFEMORAL;  Surgeon: Sherren Mocha, MD;  Location: [facility];  Service: Open Heart Surgery;  Laterality: N/A;    Current Medications: Current Meds  Medication Sig   apixaban (ELIQUIS) 5 MG TABS tablet Take 1 tablet (5 mg total) by mouth 2 (two) times daily.   ascorbic acid (VITAMIN C) 500 MG tablet Take 500 mg by mouth daily.   atorvastatin (LIPITOR) 40 MG tablet Take 40 mg by mouth daily.   cyanocobalamin (VITAMIN B12) 1000 MCG tablet Take 1,000 mcg by mouth daily.  diazepam (VALIUM) 2 MG tablet Take 2 mg by mouth daily.   DULoxetine (CYMBALTA) 60 MG capsule Take 60 mg by mouth daily.   ferrous sulfate 324 MG TBEC Take 324 mg by mouth 2 (two) times daily.   meclizine (ANTIVERT) 25 MG tablet Take 25 mg by mouth 3 (three) times daily as needed for dizziness.   meloxicam (MOBIC) 7.5 MG tablet Take 7.5 mg by mouth as needed for pain.   methocarbamol (ROBAXIN) 500 MG tablet Take 500 mg by mouth as needed for muscle spasms.   pantoprazole (PROTONIX) 40 MG tablet Take 40 mg by mouth daily.   potassium chloride (KLOR-CON) 10 MEQ tablet Take 10 mEq by mouth daily.   sotalol (BETAPACE) 80 MG tablet Take 1 tablet (80 mg total) by mouth 2 (two) times daily.   temazepam (RESTORIL) 15 MG capsule Take 15 mg by mouth at bedtime.    topiramate (TOPAMAX) 25 MG tablet Take 1 pill in AM and 2 pills in PM for 1 week,  then increase to 2 pills twice a day   triamcinolone cream (KENALOG) 0.1 % Apply 1 Application topically as needed (rash).   triamterene-hydrochlorothiazide (MAXZIDE-25) 37.5-25 MG tablet TAKE ONE TABLET BY MOUTH EVERY DAY     Allergies:   Patient has no known allergies.   Social History   Socioeconomic History   Marital status: Married    Spouse name: Not on file   Number of children: 1   Years of education: Not on file   Highest education level: Not on file  Occupational History    Comment: police officer  Tobacco Use   Smoking status: Never   Smokeless tobacco: Never  Vaping Use   Vaping Use: Never used  Substance and Sexual Activity   Alcohol use: No   Drug use: No   Sexual activity: Not on file  Other Topics Concern   Not on file  Social History Narrative   Lives with wife   retired   Investment banker, operational of Radio broadcast assistant Strain: Not on file  Food Insecurity: Not on file  Transportation Needs: Not on file  Physical Activity: Not on file  Stress: Not on file  Social Connections: Not on file     Family History: The patient's family history includes CAD in his brother.  ROS:   Please see the history of present illness.    All other systems reviewed and are negative.  EKGs/Labs/Other Studies Reviewed:    The following studies were reviewed today: Hospital records were reviewed extensively.   Recent Labs: 08/09/2022: ALT 21; BUN 10; Creatinine, Ser 1.08; Hemoglobin 12.8; Platelets 202; Potassium 4.2; Sodium 141; TSH 2.420  Recent Lipid Panel    Component Value Date/Time   CHOL 131 08/09/2022 1059   TRIG 133 08/09/2022 1059   HDL 42 08/09/2022 1059   CHOLHDL 3.1 08/09/2022 1059   LDLCALC 66 08/09/2022 1059    Physical Exam:    VS:  BP 134/78   Pulse 76   Ht 5\' 9"  (1.753 m)   Wt 180 lb 3.2 oz (81.7 kg)   SpO2 94%   BMI 26.61 kg/m     Wt Readings from Last 3 Encounters:  12/09/22 180 lb 3.2 oz (81.7 kg)  11/09/22 183 lb (83 kg)   10/13/22 187 lb (84.8 kg)     GEN: Patient is in no acute distress HEENT: Normal NECK: No JVD; No carotid bruits LYMPHATICS: No lymphadenopathy CARDIAC: Hear sounds regular, 2/6 systolic murmur  at the apex. RESPIRATORY:  Clear to auscultation without rales, wheezing or rhonchi  ABDOMEN: Soft, non-tender, non-distended MUSCULOSKELETAL:  No edema; No deformity  SKIN: Warm and dry NEUROLOGIC:  Alert and oriented x 3 PSYCHIATRIC:  Normal affect   Signed, Jenean Lindau, MD  12/09/2022 1:09 PM    [facility]

## 2022-12-09 NOTE — Telephone Encounter (Signed)
Per Dr. Geraldo Pitter, scheduled the patient for [facility] consult with Dr. Burt Knack 01/12/2023. His wife (DPR) was grateful for call and agreed with plan.

## 2022-12-12 DIAGNOSIS — I480 Paroxysmal atrial fibrillation: Secondary | ICD-10-CM | POA: Diagnosis not present

## 2022-12-12 DIAGNOSIS — N189 Chronic kidney disease, unspecified: Secondary | ICD-10-CM | POA: Diagnosis not present

## 2022-12-12 DIAGNOSIS — F418 Other specified anxiety disorders: Secondary | ICD-10-CM | POA: Diagnosis not present

## 2022-12-13 ENCOUNTER — Telehealth: Payer: Self-pay | Admitting: Psychiatry

## 2022-12-13 NOTE — Telephone Encounter (Signed)
Called and spoke to the wife and informed her. She states that she just spoke to them and they informed her that they have not received it. Wife states that she will call back and informed them of the date it was sent to see if they can look into it.

## 2022-12-13 NOTE — Telephone Encounter (Signed)
Please call back. Dr. Billey Gosling did send in an updated prescription on 11/09/22 for the increased dosing.

## 2022-12-13 NOTE — Telephone Encounter (Signed)
Melissa called from [facility]. Stated pt wife called and stated Dr. Billey Gosling increased pt  topiramate (TOPAMAX) 25 MG tablet. She is requesting a new prescription be sent over.

## 2022-12-15 ENCOUNTER — Ambulatory Visit: Payer: PPO | Admitting: Cardiology

## 2022-12-16 DIAGNOSIS — R2689 Other abnormalities of gait and mobility: Secondary | ICD-10-CM | POA: Diagnosis not present

## 2022-12-20 DIAGNOSIS — R2689 Other abnormalities of gait and mobility: Secondary | ICD-10-CM | POA: Diagnosis not present

## 2022-12-22 DIAGNOSIS — R2689 Other abnormalities of gait and mobility: Secondary | ICD-10-CM | POA: Diagnosis not present

## 2022-12-24 ENCOUNTER — Encounter: Payer: Self-pay | Admitting: Cardiovascular Disease

## 2022-12-24 ENCOUNTER — Ambulatory Visit: Payer: PPO | Attending: Cardiovascular Disease | Admitting: Cardiovascular Disease

## 2022-12-24 VITALS — BP 110/70 | HR 71 | Ht 69.0 in | Wt 177.4 lb

## 2022-12-24 DIAGNOSIS — I480 Paroxysmal atrial fibrillation: Secondary | ICD-10-CM | POA: Diagnosis not present

## 2022-12-24 DIAGNOSIS — Z952 Presence of prosthetic heart valve: Secondary | ICD-10-CM | POA: Diagnosis not present

## 2022-12-24 DIAGNOSIS — Z01810 Encounter for preprocedural cardiovascular examination: Secondary | ICD-10-CM | POA: Diagnosis not present

## 2022-12-24 NOTE — Patient Instructions (Addendum)
Medication Instructions:  Your physician recommends that you continue on your current medications as directed. Please refer to the Current Medication list given to you today.  *If you need a refill on your cardiac medications before your next appointment, please call your pharmacy*  Lab Work: BMET today If you have labs (blood work) drawn today and your tests are completely normal, you will receive your results only by: MyChart Message (if you have MyChart) OR A paper copy in the mail If you have any lab test that is abnormal or we need to change your treatment, we will call you to review the results.  Testing/Procedures: Watchman CT Your physician has requested that you have cardiac CT. Cardiac computed tomography (CT) is a painless test that uses an x-ray machine to take clear, detailed pictures of your heart. For further information please visit [email or web address] Please follow instruction sheet as given.  ECHO Your physician has requested that you have an echocardiogram. Echocardiography is a painless test that uses sound waves to create images of your heart. It provides your doctor with information about the size and shape of your heart and how well your heart's chambers and valves are working. This procedure takes approximately one hour. There are no restrictions for this procedure. Please do NOT wear cologne, perfume, aftershave, or lotions (deodorant is allowed). Please arrive 15 minutes prior to your appointment time.   Follow-Up: At [facility], you and your health needs are our priority.  As part of our continuing mission to provide you with exceptional heart care, we have created designated Provider Care Teams.  These Care Teams include your primary Cardiologist (physician) and Advanced Practice Providers (APPs -  Physician Assistants and Nurse Practitioners) who all work together to provide you with the care you need, when you need it.  We recommend signing up for  the patient portal called "MyChart".  Sign up information is provided on this After Visit Summary.  MyChart is used to connect with patients for Virtual Visits (Telemedicine).  Patients are able to view lab/test results, encounter notes, upcoming appointments, etc.  Non-urgent messages can be sent to your provider as well.   To learn more about what you can do with MyChart, go to [email or web address].    Your next appointment:   Structural Team will follow-up  Provider:   Tonny Bollman, MD    Other Instructions WATCHMAN CT INSTRUCTIONS: Your WATCHMAN CT will be scheduled at [facility]. You will be called to confirm date and time.  The day of your CT appointment, please enter [facility] through the [employer] and Children's Entrance (Entrance C off [facility] You may use the FREE valet parking offered at entrance C (encouraged to control the heart rate for the test). Check in at the large round desk through the glass doors 30 minutes prior to your scheduled appointment.   Please follow these instructions carefully:  Hold all erectile dysfunction medications at least 3 days (72 hrs) prior to test.  On the Night Before the Test: Be sure to Drink plenty of water. Do not consume any caffeinated/decaffeinated beverages or chocolate 12 hours prior to your test. Do not take any antihistamines 12 hours prior to your test.  On the Day of the Test: You may take your regular medications prior to the test.  HOLD Triamterene/Hydrochlorothiazide morning of the test.      After the Test: Drink plenty of water. After receiving IV contrast, you may experience a mild flushed feeling. This is  normal. On occasion, you may experience a mild rash up to 24 hours after the test. This is not dangerous. If this occurs, you can take Benadryl 25 mg and increase your fluid intake. If you experience trouble breathing, this can be serious. If it is severe call 911 IMMEDIATELY. If it is mild, please  call our office. If you take any of these medications: Glipizide/Metformin, Avandament, Glucavance, please do not take 48 hours after completing test unless otherwise instructed.  Once we have confirmed authorization from your insurance company, you will be called to set up a date and time for your test.   For non-scheduling related questions/concerns about your CT scan, please contact the cardiac imaging nurses: Rockwell Alexandria, Cardiac Imaging Nurse Navigator Larey Brick, Cardiac Imaging Nurse Navigator [facility] Direct Office Dial: [phone]   For scheduling needs, including cancellations and rescheduling, please call [place], [phone].  Karsten Fells, the Watchman Nurse Navigator, will call you after your CT once the [facility] Team has reviewed your imaging for an update on proceedings. [place]'s direct number is [phone] if you need assistance.

## 2022-12-24 NOTE — Progress Notes (Signed)
Watchman Consult Note   Date:  12/24/2022   ID:  Randall, Edwards September 16, 1945, MRN [id number]  PCP:  Randall Fusi, MD  Cardiologist:  Dr Randall Edwards Primary Electrophysiologist: none Referring Physician: Dr Randall Edwards   CC: to discuss Watchman implant    History of Present Illness: Randall Edwards is a 77 y.o. male referred by Dr Randall Edwards for evaluation of atrial fibrillation and stroke prevention. He has paroxysmal atrial fibrillation.  The patient has been evaluated by their referring physician and is felt to be a poor candidate for long term OAC due to recurrent falls and gait instability.  He therefore presents today for Watchman evaluation.   The patient is here with his wife and son today.  He has a history of aortic valve disease and initially underwent bioprosthetic aortic valve replacement in 2010.  He later developed severe bioprosthetic aortic stenosis and underwent valve in valve TAVR in 2020.  We attempted to perform the procedure from a transfemoral approach but were unable to cross the severely degenerated bioprosthesis.  The patient was brought back for transapical TAVR and he was treated with a 26 mm Edwards SAPIEN 3 valve.  He is post TAVR course was uncomplicated.  However, the patient has developed issues with recurrent TIA.  He was changed from rivaroxaban to apixaban last year.  He has developed progressive issues with gait instability.  He is now ambulating with a walker outside of the home.  He is no longer able to drive.  He recently had a fall with a facial injury.  He is referred for consideration of left atrial appendage occlusion because of concerns of the safety of long-term oral anticoagulation in the context of gait instability and fall risk.  Today, he denies symptoms of palpitations, chest pain, shortness of breath, orthopnea, PND, lower extremity edema, claudication, syncope, bleeding, or neurologic sequela.  However, he does admit to problems with dizziness  and lightheadedness as detailed above.  He has had recurrent falls.  The patient is tolerating medications without difficulties and is otherwise without complaint today.    Past Medical History:  Diagnosis Date   Aortic atherosclerosis 06/18/2021   Noted on 2020 CTA   Ascending aorta dilatation 05/16/2019   Ataxia    Atrial tachycardia    s/p ablation and on sotolol   BPH (benign prostatic hyperplasia)    Chronic frontal sinusitis 09/12/2014   Chronic insomnia    Chronic sphenoidal sinusitis 09/12/2014   CKD (chronic kidney disease)    Depression    Dyslipidemia 03/18/2015   Essential hypertension 03/18/2015   GERD (gastroesophageal reflux disease)    H/O cardiac radiofrequency ablation 08/2016   Hyperlipidemia    Hypertension    Hypertrophy of both inferior nasal turbinates 09/12/2014   Memory loss    Meniere's disease of left ear    Mixed dyslipidemia 10/13/2022   Multiple nasal polyps 09/12/2014   Nasal septal deviation 09/12/2014   PAF (paroxysmal atrial fibrillation) 12/17/2020   S/P aorta repair    S/P AVR (aortic valve replacement) 2010   s/p valve-in-valve TAVR 07/17/2019   s/p VIV TAVR via the TA approach after failed TF case 07/03/19   Severe aortic stenosis 05/16/2019   Status post radiofrequency ablation for arrhythmia 03/18/2015   TIA (transient ischemic attack)    Vertigo    Past Surgical History:  Procedure Laterality Date   ABLATION OF DYSRHYTHMIC FOCUS     CARDIAC VALVE REPLACEMENT     CHOLECYSTECTOMY  KIDNEY STONE SURGERY     KNEE ARTHROSCOPY Bilateral    MASS EXCISION Right 12/26/2017   Procedure: RIGHT INDEX EXCISION CYST AND DEBRIDEMENT OF DISTAL INTERPHALANGEAL JOINT;  Surgeon: Betha Loa, MD;  Location: [facility];  Service: Orthopedics;  Laterality: Right;  Bier block   RIGHT HEART CATH AND CORONARY ANGIOGRAPHY N/A 06/20/2019   Procedure: RIGHT HEART CATH AND CORONARY ANGIOGRAPHY;  Surgeon: Tonny Bollman, MD;  Location: [facility]  INVASIVE CV LAB;  Service: Cardiovascular;  Laterality: N/A;   TEE WITHOUT CARDIOVERSION N/A 07/03/2019   Procedure: TRANSESOPHAGEAL ECHOCARDIOGRAM (TEE);  Surgeon: Tonny Bollman, MD;  Location: [facility] OR;  Service: Open Heart Surgery;  Laterality: N/A;   TEE WITHOUT CARDIOVERSION N/A 07/17/2019   Procedure: TRANSESOPHAGEAL ECHOCARDIOGRAM (TEE);  Surgeon: Tonny Bollman, MD;  Location: [facility] OR;  Service: Open Heart Surgery;  Laterality: N/A;   TRANSCATHETER AORTIC VALVE REPLACEMENT, TRANSAPICAL N/A 07/17/2019   Procedure: TRANSCATHETER AORTIC VALVE REPLACEMENT, TRANSAPICAL;  Surgeon: Tonny Bollman, MD;  Location: [facility] OR;  Service: Open Heart Surgery;  Laterality: N/A;   TRANSCATHETER AORTIC VALVE REPLACEMENT, TRANSFEMORAL N/A 07/03/2019   Procedure: attempted TRANSCATHETER AORTIC VALVE REPLACEMENT, TRANSFEMORAL;  Surgeon: Tonny Bollman, MD;  Location: [facility] OR;  Service: Open Heart Surgery;  Laterality: N/A;     Current Outpatient Medications  Medication Sig Dispense Refill   apixaban (ELIQUIS) 5 MG TABS tablet Take 1 tablet (5 mg total) by mouth 2 (two) times daily. 180 tablet 1   ascorbic acid (VITAMIN C) 500 MG tablet Take 500 mg by mouth daily.     atorvastatin (LIPITOR) 40 MG tablet Take 40 mg by mouth daily.     cyanocobalamin (VITAMIN B12) 1000 MCG tablet Take 1,000 mcg by mouth daily.     diazepam (VALIUM) 2 MG tablet Take 2 mg by mouth daily.     DULoxetine (CYMBALTA) 60 MG capsule Take 60 mg by mouth daily.     ferrous sulfate 324 MG TBEC Take 324 mg by mouth 2 (two) times daily.     meclizine (ANTIVERT) 25 MG tablet Take 25 mg by mouth 3 (three) times daily as needed for dizziness.     meloxicam (MOBIC) 7.5 MG tablet Take 7.5 mg by mouth as needed for pain.     methocarbamol (ROBAXIN) 500 MG tablet Take 500 mg by mouth as needed for muscle spasms.     pantoprazole (PROTONIX) 40 MG tablet Take 40 mg by mouth daily.     potassium chloride (KLOR-CON) 10 MEQ tablet Take 10 mEq by mouth  daily.     sotalol (BETAPACE) 80 MG tablet Take 1 tablet (80 mg total) by mouth 2 (two) times daily. 180 tablet 2   tamsulosin (FLOMAX) 0.4 MG CAPS capsule SMARTSIG:1 Capsule(s) By Mouth Every Evening     temazepam (RESTORIL) 15 MG capsule Take 15 mg by mouth at bedtime.      topiramate (TOPAMAX) 25 MG tablet Take 1 pill in AM and 2 pills in PM for 1 week, then increase to 2 pills twice a day 120 tablet 6   triamterene-hydrochlorothiazide (MAXZIDE-25) 37.5-25 MG tablet TAKE ONE TABLET BY MOUTH EVERY DAY 90 tablet 1   No current facility-administered medications for this visit.    Allergies:   Patient has no known allergies.   Social History:  The patient  reports that he has never smoked. He has never used smokeless tobacco. He reports that he does not drink alcohol and does not use drugs.   Family History:  The  patient's  family history includes CAD in his brother.    ROS:  Please see the history of present illness.   All other systems are reviewed and negative.    PHYSICAL EXAM: VS:  BP 110/70   Pulse 71   Ht 5\' 9"  (1.753 m)   Wt 177 lb 6.4 oz (80.5 kg)   SpO2 96%   BMI 26.20 kg/m  , BMI Body mass index is 26.2 kg/m. GEN: Well nourished, well developed, elderly male in no acute distress  HEENT: normal except for left face ecchymosis Neck: no JVD, carotid bruits, or masses Cardiac: RRR; no murmurs, rubs, or gallops,no edema  Respiratory:  clear to auscultation bilaterally, normal work of breathing GI: soft, nontender, nondistended, + BS MS: no deformity or atrophy  Skin: warm and dry  Neuro:  Strength and sensation are intact Psych: euthymic mood, full affect  EKG:  EKG is ordered today. The ekg ordered today shows normal sinus rhythm 71 bpm, first-degree AV block, left axis deviation, nonspecific IVCD.   Recent Labs: 08/09/2022: ALT 21; BUN 10; Creatinine, Ser 1.08; Hemoglobin 12.8; Platelets 202; Potassium 4.2; Sodium 141; TSH 2.420    Lipid Panel     Component  Value Date/Time   CHOL 131 08/09/2022 1059   TRIG 133 08/09/2022 1059   HDL 42 08/09/2022 1059   CHOLHDL 3.1 08/09/2022 1059   LDLCALC 66 08/09/2022 1059     Wt Readings from Last 3 Encounters:  12/24/22 177 lb 6.4 oz (80.5 kg)  12/09/22 180 lb 3.2 oz (81.7 kg)  11/09/22 183 lb (83 kg)      Other studies Reviewed: Additional studies/ records that were reviewed today include:  Cardiac Studies & Procedures   CARDIAC CATHETERIZATION  CARDIAC CATHETERIZATION 06/20/2019  Narrative 1. Widely patent coronary arteries without stenosis 2. Normal right heart hemodynamics 3. Known severe bioprosthetic aortic valve stenosis by echo assessment  Plan: continue multidisciplinary heart team evaluation for treatment of severe bioprosthetic aortic stenosis.  Findings Coronary Findings Diagnostic  Dominance: Right  Left Main Vessel is angiographically normal.  Left Anterior Descending The vessel exhibits minimal luminal irregularities.  Ramus Intermedius Vessel is moderate in size. Vessel is angiographically normal.  Left Circumflex Vessel is angiographically normal.  Right Coronary Artery Vessel is moderate in size. Vessel is angiographically normal.  Right Ventricular Branch Widely patent, dominant RCA with no stenosis  Intervention  No interventions have been documented.     ECHOCARDIOGRAM  ECHOCARDIOGRAM COMPLETE 07/09/2020  Narrative ECHOCARDIOGRAM REPORT    Patient Name:   ACHARY ARAGONA Date of Exam: 07/09/2020 Medical Rec #:  [id number]      Height:       69.0 in Accession #:    [id number]     Weight:       176.0 lb Date of Birth:  1946/07/31      BSA:          1.957 m Patient Age:    74 years       BP:           112/70 mmHg Patient Gender: M              HR:           72 bpm. Exam Location:  [address]  Procedure: 2D Echo, Cardiac Doppler and Color Doppler  Indications:    Z95.2 Post-TAVR  History:        Patient has prior history of Echocardiogram  examinations, most recent 08/16/2019. Risk  Factors:Hypertension and Dyslipidemia. Atrial tachycardia. Severe aortic stenosis. Ascending aorta dilatation. S/P aorta repair.  Sonographer:    Cathie Beams RCS Referring Phys: Janetta Hora  IMPRESSIONS   1. Left ventricular ejection fraction, by estimation, is 60 to 65%. The left ventricle has normal function. The left ventricle has no regional wall motion abnormalities. There is severe left ventricular hypertrophy. Left ventricular diastolic parameters are consistent with Grade II diastolic dysfunction (pseudonormalization). 2. Right ventricular systolic function is normal. The right ventricular size is normal. 3. Left atrial size was mildly dilated. 4. The mitral valve is normal in structure. Trivial mitral valve regurgitation. No evidence of mitral stenosis. 5. Prior AVR with 25 mm Edwards bioprosthetic valve with valve in valve TAVR using 23 mm Medtronic Evolut valve. No PVL. Gradients only slightly increased from echo done 08/16/19 mean 14 mm->17 mmHg peak 28-> 36 mmHg Current DVI 0.42 and AVA 1.5 cm2. The aortic valve has been repaired/replaced. Aortic valve regurgitation is not visualized. No aortic stenosis is present. 6. Aortic dilatation noted. There is mild dilatation of the ascending aorta, measuring 40 mm. 7. The inferior vena cava is normal in size with greater than 50% respiratory variability, suggesting right atrial pressure of 3 mmHg.  FINDINGS Left Ventricle: Left ventricular ejection fraction, by estimation, is 60 to 65%. The left ventricle has normal function. The left ventricle has no regional wall motion abnormalities. The left ventricular internal cavity size was normal in size. There is severe left ventricular hypertrophy. Left ventricular diastolic parameters are consistent with Grade II diastolic dysfunction (pseudonormalization).  Right Ventricle: The right ventricular size is normal. No increase in right  ventricular wall thickness. Right ventricular systolic function is normal.  Left Atrium: Left atrial size was mildly dilated.  Right Atrium: Right atrial size was normal in size.  Pericardium: There is no evidence of pericardial effusion.  Mitral Valve: The mitral valve is normal in structure. Trivial mitral valve regurgitation. No evidence of mitral valve stenosis.  Tricuspid Valve: The tricuspid valve is normal in structure. Tricuspid valve regurgitation is not demonstrated. No evidence of tricuspid stenosis.  Aortic Valve: Prior AVR with 25 mm Edwards bioprosthetic valve with valve in valve TAVR using 23 mm Medtronic Evolut valve. No PVL. Gradients only slightly increased from echo done 08/16/19 mean 14 mm->17 mmHg peak 28-> 36 mmHg Current DVI 0.42 and AVA 1.5 cm2. The aortic valve has been repaired/replaced. Aortic valve regurgitation is not visualized. No aortic stenosis is present. Aortic valve mean gradient measures 16.5 mmHg. Aortic valve peak gradient measures 35.0 mmHg. Aortic valve area, by VTI measures 1.47 cm.  Pulmonic Valve: The pulmonic valve was normal in structure. Pulmonic valve regurgitation is not visualized. No evidence of pulmonic stenosis.  Aorta: The aortic root is normal in size and structure and aortic dilatation noted. There is mild dilatation of the ascending aorta, measuring 40 mm.  Venous: The inferior vena cava is normal in size with greater than 50% respiratory variability, suggesting right atrial pressure of 3 mmHg.  IAS/Shunts: The interatrial septum was not well visualized.   LEFT VENTRICLE PLAX 2D LVIDd:         4.30 cm  Diastology LVIDs:         2.40 cm  LV e' medial:    4.35 cm/s LV PW:         1.30 cm  LV E/e' medial:  20.9 LV IVS:        1.90 cm  LV e' lateral:   4.73  cm/s LVOT diam:     2.10 cm  LV E/e' lateral: 19.2 LV SV:         82 LV SV Index:   42 LVOT Area:     3.46 cm   RIGHT VENTRICLE RV Basal diam:  2.80 cm RV S prime:      7.36 cm/s TAPSE (M-mode): 1.2 cm  LEFT ATRIUM             Index       RIGHT ATRIUM           Index LA diam:        3.90 cm 1.99 cm/m  RA Area:     13.50 cm LA Vol (A2C):   68.8 ml 35.16 ml/m RA Volume:   25.50 ml  13.03 ml/m LA Vol (A4C):   43.6 ml 22.28 ml/m LA Biplane Vol: 56.4 ml 28.82 ml/m AORTIC VALVE AV Area (Vmax):    1.14 cm AV Area (Vmean):   1.17 cm AV Area (VTI):     1.47 cm AV Vmax:           296.00 cm/s AV Vmean:          184.500 cm/s AV VTI:            0.559 m AV Peak Grad:      35.0 mmHg AV Mean Grad:      16.5 mmHg LVOT Vmax:         97.30 cm/s LVOT Vmean:        62.400 cm/s LVOT VTI:          0.237 m LVOT/AV VTI ratio: 0.42  MITRAL VALVE MV Area (PHT): 2.49 cm     SHUNTS MV Decel Time: 305 msec     Systemic VTI:  0.24 m MV E velocity: 90.80 cm/s   Systemic Diam: 2.10 cm MV A velocity: 107.00 cm/s MV E/A ratio:  0.85  Charlton Haws MD Electronically signed by Charlton Haws MD Signature Date/Time: 07/09/2020/3:59:38 PM    Final   TEE  ECHO TEE 07/17/2019  Narrative *INTRAOPERATIVE TRANSESOPHAGEAL REPORT *    Patient Name:   RYHEEM JAY Date of Exam: 07/17/2019 Medical Rec #:  [id number]      Height:       69.0 in Accession #:    [id number]     Weight:       183.0 lb Date of Birth:  October 27, 1945      BSA:          1.99 m Patient Age:    73 years       BP:           160/108 mmHg Patient Gender: M              HR:           67 bpm. Exam Location:  Inpatient  Transesophogeal exam was perform intraoperatively during surgical procedure. Patient was closely monitored under general anesthesia during the entirety of examination.  Indications:     I35.0 Nonrheumatic aortic (valve) stenosis History:         Abnormal ECG; Aortic Valve Disease Risk Factors:Dyslipidemia and Hypertension. Performing Phys: [id number] Euline Kimbler Diagnosing Phys: Tobias Alexander MD  Complications: No known complications during this procedure. POST-OP IMPRESSIONS -  Left Ventricle: has normal systolic function. The cavity size was normal. - Aortic Valve: No stenosis present. A Edwards SAPIEN bioprosthetic valve was placed Manufactured by; an Edwards Size; 26. There is no regurgitation.  No regurgitation post repair. The gradient recorded across the prosthetic valve is within the expected range. No perivalvular leak noted. This was a periprocedural TEE during a TAVR procedure.  25 mm Magna Ease valve was present in the aortic position with calcified leaflets with opening restriction and with peak/mean gradients 55/31 mmHg. No aortic regurgitation.  A 26 mm Edwards-SAPIEN 3 valve was placed successfully via transapical approach. There was no perivalvular leak and peak/mean transaortic gradients 13/6 mmHg. there was minimal residual pericardial effusion in the apical area. - Mitral Valve: There is no regurgitation. No regurgitation post repair. The gradient recorded across the prosthetic valve is within the expected range. - Tricuspid Valve: There is no regurgitation. No regurgitation post repair. The gradient recorded across the prosthetic valve is within the expected range.  PRE-OP FINDINGS Left Ventricle: The left ventricle has normal systolic function, with an ejection fraction of 60-65%. The cavity size was normal. There is no increase in left ventricular wall thickness.  Right Ventricle: The right ventricle has normal systolic function. The cavity was normal. There is no increase in right ventricular wall thickness.  Left Atrium: Left atrial size was normal in size.  Right Atrium: Right atrial size was normal in size. Right atrial pressure is estimated at 10 mmHg.  Interatrial Septum: No atrial level shunt detected by color flow Doppler.  Pericardium: There is no evidence of pericardial effusion.  Mitral Valve: The mitral valve is degenerative in appearance. Severe thickening of the mitral valve leaflet. Severe calcification of the mitral valve  leaflet. There is severe mitral annular calcification present. Mitral valve regurgitation is mild by color flow Doppler.  Tricuspid Valve: The tricuspid valve was normal in structure. Tricuspid valve regurgitation was not visualized by color flow Doppler.  Aortic Valve: The aortic valve has been repaired/replaced Aortic valve regurgitation was not assessed by color flow Doppler. There is severe stenosis of the aortic valve. A 25mm Magna bioprosthetic aortic valve valve is present in the aortic position. Echo findings are consistent with stenosis of the aortic prosthesis.  Pulmonic Valve: The pulmonic valve was normal in structure, with normal. The gradient recorded across the pulmonic valve is within the expected range. The gradient recorded across the prosthetic pulmonic valve is within the expected range. Pulmonic valve regurgitation was not assessed by color flow Doppler.   Venous: The inferior vena cava is normal in size with greater than 50% respiratory variability, suggesting right atrial pressure of 3 mmHg.  +------------------+------------++ AORTIC VALVE                   +------------------+------------++ AV Vmax:          292.40 cm/s  +------------------+------------++ AV Vmean:         189.240 cm/s +------------------+------------++ AV VTI:           0.642 m      +------------------+------------++ AV Peak Grad:     34.2 mmHg    +------------------+------------++ AV Mean Grad:     31.0 mmHg    +------------------+------------++ LVOT Vmax:        114.15 cm/s  +------------------+------------++ LVOT Vmean:       74.450 cm/s  +------------------+------------++ LVOT VTI:         0.210 m      +------------------+------------++ LVOT/AV VTI ratio:0.33         +------------------+------------++   +-------------+------+ SHUNTS              +-------------+------+ Systemic VTI:0.21 m +-------------+------+   Tobias Alexander  MD Electronically signed by Tobias Alexander MD Signature Date/Time: 07/17/2019/2:39:05 PM    Final    CT SCANS  CT CORONARY MORPH W/CTA COR W/SCORE 06/12/2019  Addendum 06/12/2019 11:29 AM ADDENDUM REPORT: 06/12/2019 11:26  CLINICAL DATA:  Aortic stenosis  EXAM: Cardiac TAVR CT  TECHNIQUE: The patient was scanned on a Siemens Force 192 slice scanner. A 120 kV retrospective scan was triggered in the descending thoracic aorta at 111 HU's. Gantry rotation speed was 270 msecs and collimation was .9 mm. No beta blockade or nitro were given. The 3D data set was reconstructed in 5% intervals of the R-R cycle. Systolic and diastolic phases were analyzed on a dedicated work station using MPR, MIP and VRT modes. The patient received 80 cc of contrast.  FINDINGS: Aortic Valve: Patient has had a Bentall procedure with AVR and root replacement Stented pericardial tissue valve 25 mm Edwards MagnaEase. Thickened leaflets with restricted motion. Annulus intact ID measures 24 mm consistent with described valve replacement.  Aorta: Dilated and angulated ascending root. Suture line noted for Bentall bovine arch with moderate calcific atherosclerosis  Ascending Thoracic Aorta: 3.9 cm  Aortic Arch: 3.1 cm  Descending Thoracic Aorta: 2.9 cm  Sinus of Valsalva Measurements:  Non-coronary: 38.6 mm  Right - coronary: 38.2 mm  Left - coronary: 38 mm  Coronary Artery Height above Annulus:  Left Main: 19.9 mm above annulus and above valve strut. SA 11 mm away from virtual valve at ostium  Right Coronary: 16.1 mm above annulus and near top edge of valve strut SA 12.6 mm away from virtual valve ostium  IMPRESSION: 1. 25 mm Edwards stented pericardial tissue valve with ID 24 mm MagnaEase. Thickened leaflets with restricted motion. Intact annulus  2. Angulated and dilated aortic root 3.9 cm Appears to have had Bentall with root replacement Bovine Arch  3. Coronary arteries appear to  have sufficient height above valve struts and SA depth to avoid occlusion with valve in valve procedure  4.  Appropriate for 26 mm Sapien 3 valve in valve TAVR  Charlton Haws   Electronically Signed By: Charlton Haws M.D. On: 06/12/2019 11:26  Narrative EXAM: OVER-READ INTERPRETATION  CT CHEST  The following report is an over-read performed by radiologist Dr. Trudie Reed of [facility] on 06/12/2019. This over-read does not include interpretation of cardiac or coronary anatomy or pathology. The coronary calcium score/coronary CTA interpretation by the cardiologist is attached.  COMPARISON:  Chest CTA 07/24/2018.  FINDINGS: Extracardiac findings will be described separately under dictation for contemporaneously obtained CTA chest, abdomen and pelvis.  IMPRESSION: Please see separate dictation for contemporaneously obtained CTA chest, abdomen and pelvis dated 06/12/2019 for full description of relevant extracardiac findings.  Electronically Signed: By: Trudie Reed M.D. On: 06/12/2019 10:28              ASSESSMENT AND PLAN:  1.  Paroxysmal atrial fibrillation I have seen Randall Edwards is a 77 y.o. male in the office today who has been referred for a Watchman left atrial appendage closure device.  He has a history of paroxysmal atrial fibrillation.  The patients atrial fibrillation-related risk scores are outlined below:   CHA2DS2-VASc Score = 6   This indicates a 9.7% annual risk of stroke. The patient's score is based upon: CHF History: 0 HTN History: 1 Diabetes History: 0 Stroke History: 2 Vascular Disease History: 1 Age Score: 2 Gender Score: 0      HAS-BLED score = 2 Hypertension No  Abnormal renal and liver function (Dialysis, transplant, Cr >2.26 mg/dL /Cirrhosis or Bilirubin >2x Normal or AST/ALT/AP >3x Normal) No  Stroke Yes  Bleeding No  Labile INR (Unstable/high INR) No  Elderly (>65) Yes  Drugs or alcohol (? 8  drinks/week, anti-plt or NSAID) No   The patient is not felt to be a long term anticoagulation candidate secondary to gait instability and recurrent falls.  He has also had recurrent TIA on oral anticoagulation, changed from rivaroxaban to apixaban several months ago.  I reviewed at length with the patient and his family that there is no data to demonstrate watchman as a superior stroke reduction therapy to oral anticoagulation even in patients who have had stroke or TIA on anticoagulation.  However, the patient does have a strong indication for left atrial appendage occlusion as he continues to struggle with gait instability and falls.  It does not appear that long-term oral anticoagulation will be safe for him.  As an alternative to lon-term oral anticoagulation, a device strategy with left atrial appendage occlusion is an appropriate consideration. The patients chart has been reviewed and I along with their referring cardiologist feel that they would be a candidate for short term oral anticoagulation.  Procedural risks for the Watchman implant have been reviewed with the patient, including but not limited to a <1% risk of stroke, <1% risk of perforation or pericardial effusion, 0.1% risk of device embolization.  Given the patient's poor candidacy for long-term oral anticoagulation, ability to tolerate short term oral anticoagulation, I have recommended the Watchman FLX left atrial appendage closure device through a shared decision making conversation with the patient.   Prior to the procedure, I would like to obtain a gated cardiac CTA timed for PV/LA visualization to assess left atrial appendage anatomy and suitability for Watchman FLX implantation. Once imaging studies are performed I will review to confirm the patient continues to be a suitable candidate for Watchman implant. If so, we will schedule for the implant procedure at the first available date.  If the patient proceeds with watchman implantation  and criteria are met, he will continue apixaban for 6 weeks after the procedure, then transition to aspirin and clopidogrel through 6 months.  Current medicines are reviewed at length with the patient today.   The patient does not have concerns regarding his medicines.  The following changes were made today:  none  Labs/ tests ordered today include:  Orders Placed This Encounter  Procedures   CT CARDIAC MORPH/PULM VEIN W/CM&W/O CA SCORE   Basic metabolic panel   EKG 12-Lead   ECHOCARDIOGRAM COMPLETE     Signed, Tonny Bollman, MD 12/24/2022  11:00 AM     [facility] [address] [place] [address] [phone] (office) [phone] (fax)

## 2022-12-25 ENCOUNTER — Telehealth: Payer: Self-pay | Admitting: Internal Medicine

## 2022-12-25 LAB — BASIC METABOLIC PANEL
BUN/Creatinine Ratio: 10 (ref 10–24)
BUN: 13 mg/dL (ref 8–27)
CO2: 22 mmol/L (ref 20–29)
Calcium: 9.7 mg/dL (ref 8.6–10.2)
Chloride: 102 mmol/L (ref 96–106)
Creatinine, Ser: 1.31 mg/dL — ABNORMAL HIGH (ref 0.76–1.27)
Glucose: 87 mg/dL (ref 70–99)
Potassium: 2.9 mmol/L — CL (ref 3.5–5.2)
Sodium: 143 mmol/L (ref 134–144)
eGFR: 57 mL/min/{1.73_m2} — ABNORMAL LOW (ref >59)

## 2022-12-25 NOTE — Telephone Encounter (Signed)
Brief Telephone Encounter:  I received a page from [employer] re: significant lab result: K=2.9 on 12/24/22 BMP. I was able to get in touch with [name] wife, Randall Edwards this AM.   Patient normally takes 10 mEq potassium daily and has had intermittent hypokalemia in past. He will take one dose of potassium today and discuss any future adjustment in dosing / timing for repeat BMP with his primary care provider.

## 2022-12-26 ENCOUNTER — Other Ambulatory Visit: Payer: Self-pay | Admitting: Cardiology

## 2022-12-27 DIAGNOSIS — R2689 Other abnormalities of gait and mobility: Secondary | ICD-10-CM | POA: Diagnosis not present

## 2022-12-28 ENCOUNTER — Telehealth (HOSPITAL_COMMUNITY): Payer: Self-pay | Admitting: Emergency Medicine

## 2022-12-28 DIAGNOSIS — E876 Hypokalemia: Secondary | ICD-10-CM | POA: Diagnosis not present

## 2022-12-28 NOTE — Telephone Encounter (Signed)
Reaching out to patient to offer assistance regarding upcoming cardiac imaging study; pt verbalizes understanding of appt date/time, parking situation and where to check in, pre-test NPO status and medications ordered, and verified current allergies; name and call back number provided for further questions should they arise Rockwell Alexandria RN Navigator Cardiac Imaging [facility] [phone] office [phone] cell  Arrival 1200  Denies iV issues HOH  Daily meds except diuretics

## 2022-12-29 DIAGNOSIS — R2689 Other abnormalities of gait and mobility: Secondary | ICD-10-CM | POA: Diagnosis not present

## 2022-12-30 ENCOUNTER — Ambulatory Visit (HOSPITAL_COMMUNITY)
Admission: RE | Admit: 2022-12-30 | Discharge: 2022-12-30 | Disposition: A | Discharge status: Home / Self Care | Payer: PPO | Source: Ambulatory Visit | Attending: Cardiovascular Disease | Admitting: Cardiovascular Disease

## 2022-12-30 DIAGNOSIS — Z01810 Encounter for preprocedural cardiovascular examination: Secondary | ICD-10-CM | POA: Diagnosis not present

## 2022-12-30 DIAGNOSIS — I480 Paroxysmal atrial fibrillation: Secondary | ICD-10-CM | POA: Diagnosis not present

## 2022-12-30 MED ORDER — IOHEXOL 350 MG/ML SOLN
95.0000 mL | Freq: Once | INTRAVENOUS | Status: AC | PRN
  Administered 2022-12-30: 95 mL via INTRAVENOUS

## 2023-01-03 DIAGNOSIS — E876 Hypokalemia: Secondary | ICD-10-CM | POA: Diagnosis not present

## 2023-01-03 DIAGNOSIS — R2689 Other abnormalities of gait and mobility: Secondary | ICD-10-CM | POA: Diagnosis not present

## 2023-01-06 DIAGNOSIS — R2689 Other abnormalities of gait and mobility: Secondary | ICD-10-CM | POA: Diagnosis not present

## 2023-01-07 DIAGNOSIS — E876 Hypokalemia: Secondary | ICD-10-CM | POA: Diagnosis not present

## 2023-01-10 ENCOUNTER — Telehealth: Payer: Self-pay

## 2023-01-10 DIAGNOSIS — J342 Deviated nasal septum: Secondary | ICD-10-CM | POA: Diagnosis not present

## 2023-01-11 ENCOUNTER — Telehealth: Payer: Self-pay

## 2023-01-11 DIAGNOSIS — R2689 Other abnormalities of gait and mobility: Secondary | ICD-10-CM | POA: Diagnosis not present

## 2023-01-11 NOTE — Telephone Encounter (Signed)
Discussed the patient's LAA anatomy at Structural Team meeting this AM.  Unfortunately, the patient's anatomy is not suitable for the device.  Had a long discussion with the patient and his wife and they understand that he is not a candidate.  Dr. Excell Seltzer ordered an echocardiogram for the patient to assess aortic valve and it is scheduled 01/18/2023. They would like to know if the patient still needs to have this echo. There is a scanned echo report from 12/02/2022 in the CV procedures tab.

## 2023-01-12 ENCOUNTER — Institutional Professional Consult (permissible substitution): Payer: PPO | Admitting: Cardiovascular Disease

## 2023-01-13 DIAGNOSIS — R2689 Other abnormalities of gait and mobility: Secondary | ICD-10-CM | POA: Diagnosis not present

## 2023-01-13 NOTE — Telephone Encounter (Signed)
Spoke with the patient's wife at length. Cancelled echo scheduled for next week. Per her request, scheduled the patient for visit with Dr. Tomie China in June.  Per Dr. Excell Seltzer, informed her that while the patient's anatomy on CT is not ideal for LAAO, he is willing to try it if the patient really wants to proceed. Procedure details reviewed in detail and she understands the risk of procedure being unsuccessful are much higher because of his anatomy. The patient has been extremely weak and fatigued lately- he was taking a nap while on the phone. Ms. Cryer is not sure the patient should try the implant if there is any chance it may not be successful. She will discuss with the patient and call back if he wishes to try LAAO implant.

## 2023-01-13 NOTE — Telephone Encounter (Signed)
Discussed with Dr. Excell Seltzer.  With aortic valve mean gradient 15 and peak gradient 25 on recent echo at [place], no need to repeat echo scheduled next week.  Left message for patient to call back.

## 2023-01-17 DIAGNOSIS — R2689 Other abnormalities of gait and mobility: Secondary | ICD-10-CM | POA: Diagnosis not present

## 2023-01-18 ENCOUNTER — Ambulatory Visit (HOSPITAL_COMMUNITY): Payer: PPO

## 2023-01-20 DIAGNOSIS — Z79899 Other long term (current) drug therapy: Secondary | ICD-10-CM | POA: Diagnosis not present

## 2023-01-20 DIAGNOSIS — K449 Diaphragmatic hernia without obstruction or gangrene: Secondary | ICD-10-CM | POA: Diagnosis not present

## 2023-01-20 DIAGNOSIS — E785 Hyperlipidemia, unspecified: Secondary | ICD-10-CM | POA: Diagnosis not present

## 2023-01-20 DIAGNOSIS — G459 Transient cerebral ischemic attack, unspecified: Secondary | ICD-10-CM | POA: Diagnosis not present

## 2023-01-20 DIAGNOSIS — I480 Paroxysmal atrial fibrillation: Secondary | ICD-10-CM | POA: Diagnosis not present

## 2023-01-20 DIAGNOSIS — R972 Elevated prostate specific antigen [PSA]: Secondary | ICD-10-CM | POA: Diagnosis not present

## 2023-01-20 DIAGNOSIS — M5137 Other intervertebral disc degeneration, lumbosacral region: Secondary | ICD-10-CM | POA: Diagnosis not present

## 2023-01-20 DIAGNOSIS — F331 Major depressive disorder, recurrent, moderate: Secondary | ICD-10-CM | POA: Diagnosis not present

## 2023-01-20 DIAGNOSIS — R2689 Other abnormalities of gait and mobility: Secondary | ICD-10-CM | POA: Diagnosis not present

## 2023-01-20 DIAGNOSIS — I10 Essential (primary) hypertension: Secondary | ICD-10-CM | POA: Diagnosis not present

## 2023-01-20 DIAGNOSIS — F5104 Psychophysiologic insomnia: Secondary | ICD-10-CM | POA: Diagnosis not present

## 2023-01-20 DIAGNOSIS — H8102 Meniere's disease, left ear: Secondary | ICD-10-CM | POA: Diagnosis not present

## 2023-01-20 DIAGNOSIS — K219 Gastro-esophageal reflux disease without esophagitis: Secondary | ICD-10-CM | POA: Diagnosis not present

## 2023-01-20 DIAGNOSIS — Z8673 Personal history of transient ischemic attack (TIA), and cerebral infarction without residual deficits: Secondary | ICD-10-CM | POA: Diagnosis not present

## 2023-01-24 DIAGNOSIS — R2689 Other abnormalities of gait and mobility: Secondary | ICD-10-CM | POA: Diagnosis not present

## 2023-01-27 DIAGNOSIS — R2689 Other abnormalities of gait and mobility: Secondary | ICD-10-CM | POA: Diagnosis not present

## 2023-02-01 DIAGNOSIS — R2689 Other abnormalities of gait and mobility: Secondary | ICD-10-CM | POA: Diagnosis not present

## 2023-02-03 DIAGNOSIS — R2689 Other abnormalities of gait and mobility: Secondary | ICD-10-CM | POA: Diagnosis not present

## 2023-02-08 DIAGNOSIS — R2689 Other abnormalities of gait and mobility: Secondary | ICD-10-CM | POA: Diagnosis not present

## 2023-02-10 DIAGNOSIS — R2689 Other abnormalities of gait and mobility: Secondary | ICD-10-CM | POA: Diagnosis not present

## 2023-02-14 DIAGNOSIS — R2689 Other abnormalities of gait and mobility: Secondary | ICD-10-CM | POA: Diagnosis not present

## 2023-02-15 ENCOUNTER — Other Ambulatory Visit: Payer: Self-pay

## 2023-02-17 DIAGNOSIS — R2689 Other abnormalities of gait and mobility: Secondary | ICD-10-CM | POA: Diagnosis not present

## 2023-02-21 DIAGNOSIS — R2689 Other abnormalities of gait and mobility: Secondary | ICD-10-CM | POA: Diagnosis not present

## 2023-02-22 ENCOUNTER — Other Ambulatory Visit: Payer: Self-pay | Admitting: Cardiology

## 2023-02-22 NOTE — Telephone Encounter (Signed)
Rx sent to pharmacy   

## 2023-02-24 DIAGNOSIS — R2689 Other abnormalities of gait and mobility: Secondary | ICD-10-CM | POA: Diagnosis not present

## 2023-02-25 ENCOUNTER — Ambulatory Visit: Payer: PPO | Attending: Cardiology | Admitting: Cardiology

## 2023-02-25 ENCOUNTER — Encounter: Payer: Self-pay | Admitting: Cardiology

## 2023-02-25 VITALS — BP 122/80 | HR 70 | Ht 68.6 in | Wt 179.2 lb

## 2023-02-25 DIAGNOSIS — Z952 Presence of prosthetic heart valve: Secondary | ICD-10-CM | POA: Diagnosis not present

## 2023-02-25 DIAGNOSIS — I77810 Thoracic aortic ectasia: Secondary | ICD-10-CM | POA: Diagnosis not present

## 2023-02-25 DIAGNOSIS — I480 Paroxysmal atrial fibrillation: Secondary | ICD-10-CM | POA: Diagnosis not present

## 2023-02-25 DIAGNOSIS — G459 Transient cerebral ischemic attack, unspecified: Secondary | ICD-10-CM | POA: Diagnosis not present

## 2023-02-25 DIAGNOSIS — E782 Mixed hyperlipidemia: Secondary | ICD-10-CM

## 2023-02-25 DIAGNOSIS — I10 Essential (primary) hypertension: Secondary | ICD-10-CM

## 2023-02-25 DIAGNOSIS — R413 Other amnesia: Secondary | ICD-10-CM

## 2023-02-25 NOTE — Patient Instructions (Signed)
Medication Instructions:  Your physician recommends that you continue on your current medications as directed. Please refer to the Current Medication list given to you today.  *If you need a refill on your cardiac medications before your next appointment, please call your pharmacy*   Lab Work: None ordered If you have labs (blood work) drawn today and your tests are completely normal, you will receive your results only by: MyChart Message (if you have MyChart) OR A paper copy in the mail If you have any lab test that is abnormal or we need to change your treatment, we will call you to review the results.   Testing/Procedures: None ordered   Follow-Up: At [facility], you and your health needs are our priority.  As part of our continuing mission to provide you with exceptional heart care, we have created designated Provider Care Teams.  These Care Teams include your primary Cardiologist (physician) and Advanced Practice Providers (APPs -  Physician Assistants and Nurse Practitioners) who all work together to provide you with the care you need, when you need it.  We recommend signing up for the patient portal called "MyChart".  Sign up information is provided on this After Visit Summary.  MyChart is used to connect with patients for Virtual Visits (Telemedicine).  Patients are able to view lab/test results, encounter notes, upcoming appointments, etc.  Non-urgent messages can be sent to your provider as well.   To learn more about what you can do with MyChart, go to [email or web address].    Your next appointment:   6 month(s)  The format for your next appointment:   In Person  Provider:   Brian Munley, MD    Other Instructions none  Important Information About Sugar       

## 2023-02-25 NOTE — Progress Notes (Signed)
Cardiology Office Note:    Date:  02/25/2023   ID:  Randall Edwards, DOB [birth year], MRN [id number]  PCP:  Randall Fusi, MD  Cardiologist:  Garwin Brothers, MD   Referring MD: Randall Fusi, MD    ASSESSMENT:    1. TIA (transient ischemic attack)   2. PAF (paroxysmal atrial fibrillation) (HCC)   3. Essential hypertension   4. Ascending aorta dilatation (HCC)   5. 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 Mixed dyslipidemia   7. Memory loss   8. Mixed hyperlipidemia    PLAN:    In order of problems listed above:  Primary prevention stressed with the patient.  Importance of compliance with diet medication stressed and patient verbalized standing. Aortic valve replacement and subsequent TAVR: Clinically stable at this time. Paroxysmal atrial fibrillation: History of TIA: Not considered to be a ideal candidate for Watchman device.  Patient and wife have not given up on this pursuit.I discussed with the patient atrial fibrillation, disease process. Management and therapy including rate and rhythm control, anticoagulation benefits and potential risks were discussed extensively with the patient. Patient had multiple questions which were answered to patient's satisfaction.  He ambulates with a walker and tells me that his gait is stable now.  I told him that if there is any instability to let me know and we will have to stop the anticoagulation.  They understand. Essential hypertension: Blood pressure stable and diet was emphasized. Ascending aortic aneurysm: Stable and we will continue to monitor.  She did report from April discussed. Mixed dyslipidemia: On lipid-lowering medications.  Lipids were reviewed.  Diet emphasized. Patient will be seen in follow-up appointment in 6 months or earlier if the patient has any concerns.    Medication Adjustments/Labs and Tests Ordered: Current medicines are reviewed at length with the patient today.  Concerns regarding medicines are outlined above.   No orders of the defined types were placed in this encounter.  No orders of the defined types were placed in this encounter.    No chief complaint on file.    History of Present Illness:    Randall Edwards is a 77 y.o. male.  Patient has past medical history of aortic valve replacement and subsequently TAVR, paroxysmal atrial fibrillation and patient is not considered a candidate for Watchman, essential hypertension, ascending aortic aneurysm, mixed dyslipidemia.  Patient denies any problems at this time.  He ambulates with a walker.  His gait is stable but he needs to walk.  He has completed physical therapy.  He has not had any falls.  His wife mentions to me that his gait has been stable since physical therapy.  At the time of my evaluation, the patient is alert awake oriented and in no distress.  Past Medical History:  Diagnosis Date   Aortic atherosclerosis (HCC) 06/18/2021   Noted on 2020 CTA   Ascending aorta dilatation (HCC) 05/16/2019   Ataxia    Atrial tachycardia    s/p ablation and on sotolol   BPH (benign prostatic hyperplasia)    Chronic frontal sinusitis 09/12/2014   Chronic insomnia    Chronic sphenoidal sinusitis 09/12/2014   CKD (chronic kidney disease)    Depression    Dyslipidemia 03/18/2015   Essential hypertension 03/18/2015   Fall 12/09/2022   GERD (gastroesophageal reflux disease)    H/O cardiac radiofrequency ablation 08/2016   Hyperlipidemia    Hypertension    Hypertrophy of both inferior nasal turbinates 09/12/2014   Memory loss  Meniere's disease of left ear    Mixed dyslipidemia 10/13/2022   Multiple nasal polyps 09/12/2014   Nasal septal deviation 09/12/2014   PAF (paroxysmal atrial fibrillation) (HCC) 12/17/2020   S/P aorta repair    S/P AVR (aortic valve replacement) 2010   s/p valve-in-valve TAVR 07/17/2019   s/p VIV TAVR via the TA approach after failed TF case 07/03/19   Severe aortic stenosis 05/16/2019   Status post radiofrequency  ablation for arrhythmia 03/18/2015   Stroke (HCC) 12/09/2022   TIA (transient ischemic attack)    Vertigo     Past Surgical History:  Procedure Laterality Date   ABLATION OF DYSRHYTHMIC FOCUS     CARDIAC VALVE REPLACEMENT     CHOLECYSTECTOMY     KIDNEY STONE SURGERY     KNEE ARTHROSCOPY Bilateral    MASS EXCISION Right 12/26/2017   Procedure: RIGHT INDEX EXCISION CYST AND DEBRIDEMENT OF DISTAL INTERPHALANGEAL JOINT;  Surgeon: Betha Loa, MD;  Location: [facility];  Service: Orthopedics;  Laterality: Right;  Bier block   RIGHT HEART CATH AND CORONARY ANGIOGRAPHY N/A 06/20/2019   Procedure: RIGHT HEART CATH AND CORONARY ANGIOGRAPHY;  Surgeon: Tonny Bollman, MD;  Location: [facility] INVASIVE CV LAB;  Service: Cardiovascular;  Laterality: N/A;   TEE WITHOUT CARDIOVERSION N/A 07/03/2019   Procedure: TRANSESOPHAGEAL ECHOCARDIOGRAM (TEE);  Surgeon: Tonny Bollman, MD;  Location: [facility] OR;  Service: Open Heart Surgery;  Laterality: N/A;   TEE WITHOUT CARDIOVERSION N/A 07/17/2019   Procedure: TRANSESOPHAGEAL ECHOCARDIOGRAM (TEE);  Surgeon: Tonny Bollman, MD;  Location: [facility] OR;  Service: Open Heart Surgery;  Laterality: N/A;   TRANSCATHETER AORTIC VALVE REPLACEMENT, TRANSAPICAL N/A 07/17/2019   Procedure: TRANSCATHETER AORTIC VALVE REPLACEMENT, TRANSAPICAL;  Surgeon: Tonny Bollman, MD;  Location: [facility] OR;  Service: Open Heart Surgery;  Laterality: N/A;   TRANSCATHETER AORTIC VALVE REPLACEMENT, TRANSFEMORAL N/A 07/03/2019   Procedure: attempted TRANSCATHETER AORTIC VALVE REPLACEMENT, TRANSFEMORAL;  Surgeon: Tonny Bollman, MD;  Location: [facility] OR;  Service: Open Heart Surgery;  Laterality: N/A;    Current Medications: Current Meds  Medication Sig   apixaban (ELIQUIS) 5 MG TABS tablet Take 1 tablet (5 mg total) by mouth 2 (two) times daily.   cyanocobalamin (VITAMIN B12) 1000 MCG tablet Take 1,000 mcg by mouth daily.   diazepam (VALIUM) 2 MG tablet Take 2 mg by mouth daily.   DULoxetine  (CYMBALTA) 60 MG capsule Take 60 mg by mouth daily.   ferrous sulfate 324 MG TBEC Take 324 mg by mouth 2 (two) times daily.   meclizine (ANTIVERT) 25 MG tablet Take 25 mg by mouth 3 (three) times daily as needed for dizziness.   meloxicam (MOBIC) 7.5 MG tablet Take 7.5 mg by mouth as needed for pain.   pantoprazole (PROTONIX) 40 MG tablet Take 40 mg by mouth daily.   potassium chloride SA (KLOR-CON M) 20 MEQ tablet Take 20 mEq by mouth 2 (two) times daily.   rosuvastatin (CRESTOR) 10 MG tablet Take 10 mg by mouth daily.   sotalol (BETAPACE) 80 MG tablet Take 1 tablet (80 mg total) by mouth 2 (two) times daily.   temazepam (RESTORIL) 15 MG capsule Take 15 mg by mouth at bedtime.    topiramate (TOPAMAX) 25 MG tablet Take 1 pill in AM and 2 pills in PM for 1 week, then increase to 2 pills twice a day   triamterene-hydrochlorothiazide (MAXZIDE-25) 37.5-25 MG tablet TAKE ONE TABLET BY MOUTH EVERY DAY     Allergies:   Patient has no known allergies.  Social History   Socioeconomic History   Marital status: Married    Spouse name: Not on file   Number of children: 1   Years of education: Not on file   Highest education level: Not on file  Occupational History    Comment: police officer  Tobacco Use   Smoking status: Never   Smokeless tobacco: Never  Vaping Use   Vaping Use: Never used  Substance and Sexual Activity   Alcohol use: No   Drug use: No   Sexual activity: Not on file  Other Topics Concern   Not on file  Social History Narrative   Lives with wife   retired   International aid/development worker of [employer] Strain: Not on file  Food Insecurity: Not on file  Transportation Needs: Not on file  Physical Activity: Not on file  Stress: Not on file  Social Connections: Not on file     Family History: The patient's family history includes CAD in his brother.  ROS:   Please see the history of present illness.    All other systems reviewed and are  negative.  EKGs/Labs/Other Studies Reviewed:    The following studies were reviewed today: EKG reveals sinus rhythm and nonspecific ST-T changes   Recent Labs: 08/09/2022: ALT 21; Hemoglobin 12.8; Platelets 202; TSH 2.420 12/24/2022: BUN 13; Creatinine, Ser 1.31; Potassium 2.9; Sodium 143  Recent Lipid Panel    Component Value Date/Time   CHOL 131 08/09/2022 1059   TRIG 133 08/09/2022 1059   HDL 42 08/09/2022 1059   CHOLHDL 3.1 08/09/2022 1059   LDLCALC 66 08/09/2022 1059    Physical Exam:    VS:  BP 122/80   Pulse 70   Ht 5' 8.6" (1.742 m)   Wt 179 lb 3.2 oz (81.3 kg)   SpO2 97%   BMI 26.77 kg/m     Wt Readings from Last 3 Encounters:  02/25/23 179 lb 3.2 oz (81.3 kg)  12/24/22 177 lb 6.4 oz (80.5 kg)  12/09/22 180 lb 3.2 oz (81.7 kg)     GEN: Patient is in no acute distress HEENT: Normal NECK: No JVD; No carotid bruits LYMPHATICS: No lymphadenopathy CARDIAC: Hear sounds regular, 2/6 systolic murmur at the apex. RESPIRATORY:  Clear to auscultation without rales, wheezing or rhonchi  ABDOMEN: Soft, non-tender, non-distended MUSCULOSKELETAL:  No edema; No deformity  SKIN: Warm and dry NEUROLOGIC:  Alert and oriented x 3 PSYCHIATRIC:  Normal affect   Signed, Garwin Brothers, MD  02/25/2023 2:02 PM    [facility]

## 2023-04-06 ENCOUNTER — Telehealth: Payer: Self-pay | Admitting: Psychiatry

## 2023-04-06 ENCOUNTER — Encounter: Payer: Self-pay | Admitting: Psychiatry

## 2023-04-06 NOTE — Telephone Encounter (Signed)
LVM and sent letter in mail informing pt of need to reschedule 07/11/23 appt - MD leaving practice

## 2023-04-22 DIAGNOSIS — R972 Elevated prostate specific antigen [PSA]: Secondary | ICD-10-CM | POA: Diagnosis not present

## 2023-04-22 DIAGNOSIS — M5137 Other intervertebral disc degeneration, lumbosacral region: Secondary | ICD-10-CM | POA: Diagnosis not present

## 2023-04-22 DIAGNOSIS — E785 Hyperlipidemia, unspecified: Secondary | ICD-10-CM | POA: Diagnosis not present

## 2023-04-22 DIAGNOSIS — R0982 Postnasal drip: Secondary | ICD-10-CM | POA: Diagnosis not present

## 2023-04-22 DIAGNOSIS — K219 Gastro-esophageal reflux disease without esophagitis: Secondary | ICD-10-CM | POA: Diagnosis not present

## 2023-04-22 DIAGNOSIS — F5104 Psychophysiologic insomnia: Secondary | ICD-10-CM | POA: Diagnosis not present

## 2023-04-22 DIAGNOSIS — I480 Paroxysmal atrial fibrillation: Secondary | ICD-10-CM | POA: Diagnosis not present

## 2023-04-22 DIAGNOSIS — G459 Transient cerebral ischemic attack, unspecified: Secondary | ICD-10-CM | POA: Diagnosis not present

## 2023-04-22 DIAGNOSIS — H8102 Meniere's disease, left ear: Secondary | ICD-10-CM | POA: Diagnosis not present

## 2023-04-22 DIAGNOSIS — I10 Essential (primary) hypertension: Secondary | ICD-10-CM | POA: Diagnosis not present

## 2023-04-22 DIAGNOSIS — F331 Major depressive disorder, recurrent, moderate: Secondary | ICD-10-CM | POA: Diagnosis not present

## 2023-04-22 DIAGNOSIS — K449 Diaphragmatic hernia without obstruction or gangrene: Secondary | ICD-10-CM | POA: Diagnosis not present

## 2023-04-23 LAB — LAB REPORT - SCANNED: EGFR: 63

## 2023-05-06 DIAGNOSIS — R159 Full incontinence of feces: Secondary | ICD-10-CM | POA: Diagnosis not present

## 2023-05-06 DIAGNOSIS — N401 Enlarged prostate with lower urinary tract symptoms: Secondary | ICD-10-CM | POA: Diagnosis not present

## 2023-05-06 DIAGNOSIS — N3941 Urge incontinence: Secondary | ICD-10-CM | POA: Diagnosis not present

## 2023-05-06 DIAGNOSIS — R32 Unspecified urinary incontinence: Secondary | ICD-10-CM | POA: Diagnosis not present

## 2023-05-06 DIAGNOSIS — R972 Elevated prostate specific antigen [PSA]: Secondary | ICD-10-CM | POA: Diagnosis not present

## 2023-05-17 ENCOUNTER — Telehealth: Payer: Self-pay | Admitting: Cardiology

## 2023-05-17 MED ORDER — APIXABAN 5 MG PO TABS: 5.0000 mg | ORAL_TABLET | Freq: Two times a day (BID) | ORAL | Status: DC

## 2023-05-17 NOTE — Telephone Encounter (Signed)
Patient wanting Ladonna Snide to call him regarding Eliquis/ please call either [phone] or [phone]

## 2023-05-17 NOTE — Telephone Encounter (Signed)
Samples, pt assistance and gap support given to pt.

## 2023-05-18 ENCOUNTER — Encounter: Payer: Self-pay | Admitting: Neurology

## 2023-05-18 ENCOUNTER — Ambulatory Visit: Payer: PPO | Admitting: Neurology

## 2023-05-18 VITALS — BP 122/79 | HR 75 | Ht 69.0 in | Wt 173.5 lb

## 2023-05-18 DIAGNOSIS — G250 Essential tremor: Secondary | ICD-10-CM

## 2023-05-18 MED ORDER — PROPRANOLOL HCL 10 MG PO TABS: 10.0000 mg | ORAL_TABLET | Freq: Two times a day (BID) | ORAL | 0 refills | Status: DC

## 2023-05-18 NOTE — Progress Notes (Signed)
CC:  Tremors, TIA   Follow-up Visit  Last visit: 11/09/2022  Brief HPI: 77 year old male with a history of CAD s/p CABG, HTN, aortic stenosis s/p AVR, afib s/p ablation, GERD, HLD, TIA/CVA, Meniere's disease, lumbar stenosis who follows in clinic for tremors of both hand. He has seen Dr. Pearlean Brownie for his TIA, Xarelto switched to Eliquis. He is also on Topiramate for essential tremors,    Interval History: Patient presents today with wife and sons.  He tells me that his essential tremor has not improved, he still having difficulty mainly when eating and drinking his coffee.  He is still able to button his shirt and shave.  He has increased her topiramate to 50 mg twice daily but again no improvement.  Son is reporting overall improvement in his gait, he is walking without the walker and they are happy with his progress. He is interested in driving again.   Physical Exam:   Vital Signs: BP 122/79   Pulse 75   Ht 5\' 9"  (1.753 m)   Wt 173 lb 8 oz (78.7 kg)   BMI 25.62 kg/m  GENERAL:  well appearing, in no acute distress, alert  SKIN:  Color, texture, turgor normal. No rashes or lesions HEAD:  Normocephalic/atraumatic. RESP: normal respiratory effort MSK:  No gross joint deformities.   NEUROLOGICAL: Mental Status:     11/09/2022   10:50 AM  Montreal Cognitive Assessment   Visuospatial/ Executive (0/5) 5  Naming (0/3) 3  Attention: Read list of digits (0/2) 2  Attention: Read list of letters (0/1) 1  Attention: Serial 7 subtraction starting at 100 (0/3) 1  Language: Repeat phrase (0/2) 2  Language : Fluency (0/1) 0  Abstraction (0/2) 1  Delayed Recall (0/5) 0  Orientation (0/6) 3  Total 18   Cranial Nerves: PERRL, face symmetric, no dysarthria, hearing grossly intact Motor: moves all extremities equally. R>L postural tremor present in bilateral upper extremities. There is also action tremors. Fine finger movements normal without bradykinesia. Gait: stopped posture,  narrow-based gait with normal stride length   IMPRESSION: 77 year old male with a history of CAD s/p CABG, HTN, aortic stenosis s/p AVR, afib s/p ablation, GERD, HLD, TIA/CVA, Meniere's disease, lumbar stenosis who presents for follow up of TIA, imbalance, and tremor.   In term of the TIA, he is doing well, has not had any additional event.  Again he is on Eliquis.   In terms of imbalance, he is doing well, he is able to ambulate without a walker, son stated that he is almost back to his normal self.   When it comes to his tremor, patient does have evidence of benign essential tremor, he is already on topiramate 50 mg twice daily, per report medication is not helpful, with this cognitive impairment also going up on the topiramate can cause additional cognitive deficits.  We will switch him to a different medication.  He is unable to do primidone due to interaction with Eliquis, will start him on low-dose propranolol.  He is already on sotalol but will manage his blood pressure and watch for any side effect of low blood pressure.  Wife understands to contact me for any concerns.  I will see him in 6 months for follow-up or sooner if worse.   Patient Instructions  Discontinue Topiramate  Start Propanolol 10 mg BID, discussed side effect of dizziness  Cannot to Primidone due to interaction with Eliquis  Please contact me for any other issues  Return in 6 months or sooner if worse    I have spent a total of 30 minutes dedicated to this patient today, preparing to see patient, performing a medically appropriate examination and evaluation, ordering tests and/or medications and procedures, and counseling and educating the patient/family/caregiver; independently interpreting result and communicating results to the family/patient/caregiver; and documenting clinical information in the electronic medical record.    Windell Norfolk, MD  11/09/22 5:46 PM

## 2023-05-18 NOTE — Patient Instructions (Signed)
Discontinue Topiramate  Start Propanolol 10 mg BID, discussed side effect of dizziness  Cannot to Primidone due to interaction with Eliquis  Please contact me for any other issues  Return in 6 months or sooner if worse

## 2023-05-22 ENCOUNTER — Other Ambulatory Visit: Payer: Self-pay | Admitting: Cardiology

## 2023-05-22 DIAGNOSIS — I700 Atherosclerosis of aorta: Secondary | ICD-10-CM

## 2023-05-23 ENCOUNTER — Other Ambulatory Visit: Payer: Self-pay | Admitting: Cardiology

## 2023-05-24 ENCOUNTER — Telehealth: Payer: Self-pay | Admitting: Neurology

## 2023-05-24 ENCOUNTER — Encounter: Payer: Self-pay | Admitting: Neurology

## 2023-05-24 NOTE — Telephone Encounter (Signed)
I told him, he can resume driving. Letter printed.

## 2023-05-24 NOTE — Telephone Encounter (Signed)
Pt said PCP, Dr. Tomasa Blase requesting a letter from Dr. Teresa Coombs that alright for pt to drive. Can call Dr. Hoy Finlay office at [phone]

## 2023-05-25 NOTE — Telephone Encounter (Signed)
Letter faxed to dr. Tomasa Blase at [phone]

## 2023-06-13 ENCOUNTER — Telehealth: Payer: Self-pay | Admitting: Neurology

## 2023-06-13 NOTE — Telephone Encounter (Signed)
Because of his Eliquis, we do not have a lot of option, its either Topiramate or propanolol.

## 2023-06-13 NOTE — Telephone Encounter (Signed)
Called and spoke to wife linda (on [place]). She reports he stopped taking propranolol last night because it is not helping and she thinks he may be even a little worse and more fidgety. She would like to try something else or discuss different options. He was taking 10mg  twice a day

## 2023-06-13 NOTE — Telephone Encounter (Signed)
Pt's wife called and LVM stating that the pt was given a Rx in the beginning of the month and she is needing to discuss with Provider or RN. She did not leave name of the medication. Please advise.

## 2023-06-14 NOTE — Telephone Encounter (Signed)
Called and spoke to patient wife, linda about the medications and she states that the topamax, did nothing for him and he was on it for several months and the propranolol, he took for a month and saw little change and experienced some effects of the medication. She is wanting any recommendations provider may have for them

## 2023-06-20 ENCOUNTER — Other Ambulatory Visit: Payer: Self-pay | Admitting: Neurology

## 2023-06-20 MED ORDER — TOPIRAMATE 50 MG PO TABS: 100.0000 mg | ORAL_TABLET | Freq: Two times a day (BID) | ORAL | 0 refills | Status: DC

## 2023-06-20 NOTE — Telephone Encounter (Signed)
Spoke with spouse, will discontinue Propanolol and start patient on Topiramate, 50 mg BID and increase to 100 mg BID.

## 2023-07-11 ENCOUNTER — Ambulatory Visit: Payer: PPO | Admitting: Psychiatry

## 2023-07-25 DIAGNOSIS — G250 Essential tremor: Secondary | ICD-10-CM | POA: Diagnosis not present

## 2023-07-25 DIAGNOSIS — I480 Paroxysmal atrial fibrillation: Secondary | ICD-10-CM | POA: Diagnosis not present

## 2023-07-25 DIAGNOSIS — F5104 Psychophysiologic insomnia: Secondary | ICD-10-CM | POA: Diagnosis not present

## 2023-07-25 DIAGNOSIS — I10 Essential (primary) hypertension: Secondary | ICD-10-CM | POA: Diagnosis not present

## 2023-07-25 DIAGNOSIS — F331 Major depressive disorder, recurrent, moderate: Secondary | ICD-10-CM | POA: Diagnosis not present

## 2023-07-25 DIAGNOSIS — K219 Gastro-esophageal reflux disease without esophagitis: Secondary | ICD-10-CM | POA: Diagnosis not present

## 2023-07-25 DIAGNOSIS — E785 Hyperlipidemia, unspecified: Secondary | ICD-10-CM | POA: Diagnosis not present

## 2023-07-25 DIAGNOSIS — Z8673 Personal history of transient ischemic attack (TIA), and cerebral infarction without residual deficits: Secondary | ICD-10-CM | POA: Diagnosis not present

## 2023-07-25 DIAGNOSIS — K449 Diaphragmatic hernia without obstruction or gangrene: Secondary | ICD-10-CM | POA: Diagnosis not present

## 2023-07-25 DIAGNOSIS — H8102 Meniere's disease, left ear: Secondary | ICD-10-CM | POA: Diagnosis not present

## 2023-07-25 DIAGNOSIS — M51379 Other intervertebral disc degeneration, lumbosacral region without mention of lumbar back pain or lower extremity pain: Secondary | ICD-10-CM | POA: Diagnosis not present

## 2023-07-25 DIAGNOSIS — R972 Elevated prostate specific antigen [PSA]: Secondary | ICD-10-CM | POA: Diagnosis not present

## 2023-08-01 ENCOUNTER — Other Ambulatory Visit: Payer: Self-pay | Admitting: Neurology

## 2023-08-01 DIAGNOSIS — M51379 Other intervertebral disc degeneration, lumbosacral region without mention of lumbar back pain or lower extremity pain: Secondary | ICD-10-CM | POA: Diagnosis not present

## 2023-08-01 MED ORDER — TOPIRAMATE 50 MG PO TABS: 100.0000 mg | ORAL_TABLET | Freq: Two times a day (BID) | ORAL | 6 refills | Status: DC

## 2023-08-01 NOTE — Telephone Encounter (Signed)
Pt is needing a refill on his topiramate (TOPAMAX) 50 MG tablet  sent in to the [facility]. His wife called and stated he is doing good on this medication and they will like to continue using it. Pt will run out by this Friday.

## 2023-08-10 DIAGNOSIS — M545 Low back pain, unspecified: Secondary | ICD-10-CM | POA: Diagnosis not present

## 2023-08-15 DIAGNOSIS — M545 Low back pain, unspecified: Secondary | ICD-10-CM | POA: Diagnosis not present

## 2023-08-18 DIAGNOSIS — M545 Low back pain, unspecified: Secondary | ICD-10-CM | POA: Diagnosis not present

## 2023-08-22 DIAGNOSIS — M545 Low back pain, unspecified: Secondary | ICD-10-CM | POA: Diagnosis not present

## 2023-08-25 ENCOUNTER — Other Ambulatory Visit: Payer: Self-pay

## 2023-08-25 ENCOUNTER — Ambulatory Visit: Payer: PPO | Admitting: Cardiology

## 2023-08-29 DIAGNOSIS — M545 Low back pain, unspecified: Secondary | ICD-10-CM | POA: Diagnosis not present

## 2023-08-30 ENCOUNTER — Ambulatory Visit: Payer: PPO | Attending: Cardiology | Admitting: Cardiology

## 2023-08-30 ENCOUNTER — Other Ambulatory Visit: Payer: Self-pay

## 2023-08-30 ENCOUNTER — Encounter: Payer: Self-pay | Admitting: Cardiology

## 2023-08-30 VITALS — BP 96/68 | HR 68 | Ht 69.0 in | Wt 180.2 lb

## 2023-08-30 DIAGNOSIS — I77810 Thoracic aortic ectasia: Secondary | ICD-10-CM | POA: Diagnosis not present

## 2023-08-30 DIAGNOSIS — I480 Paroxysmal atrial fibrillation: Secondary | ICD-10-CM

## 2023-08-30 DIAGNOSIS — Z952 Presence of prosthetic heart valve: Secondary | ICD-10-CM | POA: Diagnosis not present

## 2023-08-30 DIAGNOSIS — E782 Mixed hyperlipidemia: Secondary | ICD-10-CM

## 2023-08-30 DIAGNOSIS — I10 Essential (primary) hypertension: Secondary | ICD-10-CM | POA: Diagnosis not present

## 2023-08-30 DIAGNOSIS — I700 Atherosclerosis of aorta: Secondary | ICD-10-CM | POA: Diagnosis not present

## 2023-08-30 MED ORDER — APIXABAN 5 MG PO TABS: 5.0000 mg | ORAL_TABLET | Freq: Two times a day (BID) | ORAL | 0 refills | Status: DC

## 2023-08-30 NOTE — Progress Notes (Signed)
Cardiology Office Note:    Date:  08/30/2023   ID:  KEAIR STEEVER, DOB 1946-01-12, MRN [id number]  PCP:  Paulina Fusi, MD  Cardiologist:  Garwin Brothers, MD   Referring MD: Paulina Fusi, MD    ASSESSMENT:    1. Aortic atherosclerosis (HCC)   2. Ascending aorta dilatation (HCC)   3. Essential hypertension   4. PAF (paroxysmal atrial fibrillation) (HCC)   5. S/P AVR (aortic valve replacement)   6. s/p valve-in-valve TAVR   7. Mixed dyslipidemia   8. Mixed hyperlipidemia    PLAN:    In order of problems listed above:  Primary prevention stressed with the patient.  Importance of compliance with diet medication stressed and patient verbalized standing. Paroxysmal atrial fibrillation:I discussed with the patient atrial fibrillation, disease process. Management and therapy including rate and rhythm control, anticoagulation benefits and potential risks were discussed extensively with the patient. Patient had multiple questions which were answered to patient's satisfaction. History of many years: For this reason he is on triamterene hydrochlorothiazide and he tolerates this well.  His blood pressure is borderline.  He is asymptomatic.  His wife mentions to me that they check blood pressures meticulously and they are in the range of 120/70. Post aortic valve replacement: Stable Mixed dyslipidemia: On lipid-lowering medications followed by primary care.  Goal LDL is less than 60. Patient will be seen in follow-up appointment in 6 months or earlier if the patient has any concerns.    Medication Adjustments/Labs and Tests Ordered: Current medicines are reviewed at length with the patient today.  Concerns regarding medicines are outlined above.  No orders of the defined types were placed in this encounter.  No orders of the defined types were placed in this encounter.    No chief complaint on file.    History of Present Illness:    Randall Edwards is a 77 y.o. male.   Patient has past medical history of aortic atherosclerosis, ascending aortic dilatation, aortic valve replacement with valve in valve TAVR and patient was not considered a candidate for Watchman device because of anatomical considerations.  He denies any chest pain orthopnea or PND.  His wife mentions to me that he had a significant intake of Mnire's vertigo.  Since then he has had no issues.  No chest pain orthopnea or PND.  Overall he is stable on his feet.  At the time of my evaluation, the patient is alert awake oriented and in no distress.  Past Medical History:  Diagnosis Date   Aortic atherosclerosis (HCC) 06/18/2021   Noted on 2020 CTA   Ascending aorta dilatation (HCC) 05/16/2019   Ataxia    Atrial tachycardia (HCC)    s/p ablation and on sotolol   BPH (benign prostatic hyperplasia)    Chronic frontal sinusitis 09/12/2014   Chronic insomnia    Chronic sphenoidal sinusitis 09/12/2014   CKD (chronic kidney disease)    Depression    Dyslipidemia 03/18/2015   Essential hypertension 03/18/2015   Fall 12/09/2022   GERD (gastroesophageal reflux disease)    H/O cardiac radiofrequency ablation 08/2016   Hyperlipidemia    Hypertension    Hypertrophy of both inferior nasal turbinates 09/12/2014   Memory loss    Meniere's disease of left ear    Mixed dyslipidemia 10/13/2022   Multiple nasal polyps 09/12/2014   Nasal septal deviation 09/12/2014   PAF (paroxysmal atrial fibrillation) (HCC) 12/17/2020   S/P aorta repair    S/P AVR (aortic  valve replacement) 2010   s/p valve-in-valve TAVR 07/17/2019   s/p VIV TAVR via the TA approach after failed TF case 07/03/19   Severe aortic stenosis 05/16/2019   Status post radiofrequency ablation for arrhythmia 03/18/2015   Stroke (HCC) 12/09/2022   TIA (transient ischemic attack)    Vertigo     Past Surgical History:  Procedure Laterality Date   ABLATION OF DYSRHYTHMIC FOCUS     CARDIAC VALVE REPLACEMENT     CHOLECYSTECTOMY     KIDNEY  STONE SURGERY     KNEE ARTHROSCOPY Bilateral    MASS EXCISION Right 12/26/2017   Procedure: RIGHT INDEX EXCISION CYST AND DEBRIDEMENT OF DISTAL INTERPHALANGEAL JOINT;  Surgeon: Betha Loa, MD;  Location: [facility];  Service: Orthopedics;  Laterality: Right;  Bier block   RIGHT HEART CATH AND CORONARY ANGIOGRAPHY N/A 06/20/2019   Procedure: RIGHT HEART CATH AND CORONARY ANGIOGRAPHY;  Surgeon: Tonny Bollman, MD;  Location: [facility] INVASIVE CV LAB;  Service: Cardiovascular;  Laterality: N/A;   TEE WITHOUT CARDIOVERSION N/A 07/03/2019   Procedure: TRANSESOPHAGEAL ECHOCARDIOGRAM (TEE);  Surgeon: Tonny Bollman, MD;  Location: [facility] OR;  Service: Open Heart Surgery;  Laterality: N/A;   TEE WITHOUT CARDIOVERSION N/A 07/17/2019   Procedure: TRANSESOPHAGEAL ECHOCARDIOGRAM (TEE);  Surgeon: Tonny Bollman, MD;  [facility] OR;  Service: Open Heart Surgery;  Laterality: N/A;   TRANSCATHETER AORTIC VALVE REPLACEMENT, TRANSAPICAL N/A 07/17/2019   Procedure: TRANSCATHETER AORTIC VALVE REPLACEMENT, TRANSAPICAL;  Surgeon: Tonny Bollman, MD;  Location: [facility] OR;  Service: Open Heart Surgery;  Laterality: N/A;   TRANSCATHETER AORTIC VALVE REPLACEMENT, TRANSFEMORAL N/A 07/03/2019   Procedure: attempted TRANSCATHETER AORTIC VALVE REPLACEMENT, TRANSFEMORAL;  Surgeon: Tonny Bollman, MD;  Location: [facility] OR;  Service: Open Heart Surgery;  Laterality: N/A;    Current Medications: Current Meds  Medication Sig   apixaban (ELIQUIS) 5 MG TABS tablet Take 1 tablet (5 mg total) by mouth 2 (two) times daily.   cyanocobalamin (VITAMIN B12) 1000 MCG tablet Take 1,000 mcg by mouth daily.   diazepam (VALIUM) 2 MG tablet Take 2 mg by mouth daily.   DULoxetine (CYMBALTA) 60 MG capsule Take 60 mg by mouth daily.   ELIQUIS 5 MG TABS tablet Take 1 tablet (5 mg total) by mouth 2 (two) times daily.   meclizine (ANTIVERT) 25 MG tablet Take 25 mg by mouth 3 (three) times daily as needed for dizziness.   meloxicam (MOBIC) 7.5  MG tablet Take 7.5 mg by mouth as needed for pain.   MYRBETRIQ 50 MG TB24 tablet Take 50 mg by mouth daily.   ondansetron (ZOFRAN-ODT) 4 MG disintegrating tablet Take 4 mg by mouth every 4 (four) hours as needed.   pantoprazole (PROTONIX) 40 MG tablet Take 40 mg by mouth daily.   potassium chloride SA (KLOR-CON M) 20 MEQ tablet Take 20 mEq by mouth 2 (two) times daily.   rosuvastatin (CRESTOR) 10 MG tablet Take 10 mg by mouth daily.   sotalol (BETAPACE) 80 MG tablet Take 1 tablet (80 mg total) by mouth 2 (two) times daily.   tamsulosin (FLOMAX) 0.4 MG CAPS capsule Take 0.4 mg by mouth at bedtime.   temazepam (RESTORIL) 15 MG capsule Take 15 mg by mouth at bedtime.    topiramate (TOPAMAX) 50 MG tablet Take 2 tablets (100 mg total) by mouth 2 (two) times daily.   triamterene-hydrochlorothiazide (MAXZIDE-25) 37.5-25 MG tablet Take 1 tablet by mouth daily.     Allergies:   Patient has no known allergies.  Social History   Socioeconomic History   Marital status: Married    Spouse name: Not on file   Number of children: 1   Years of education: Not on file   Highest education level: Not on file  Occupational History    Comment: police officer  Tobacco Use   Smoking status: Never   Smokeless tobacco: Never  Vaping Use   Vaping status: Never Used  Substance and Sexual Activity   Alcohol use: No   Drug use: No   Sexual activity: Not on file  Other Topics Concern   Not on file  Social History Narrative   Lives with wife   retired   [employer] Strain: Not on file  Food Insecurity: Not on file  Transportation Needs: Not on file  Physical Activity: Not on file  Stress: Not on file  Social Connections: Not on file     Family History: The patient's family history includes CAD in his brother.  ROS:   Please see the history of present illness.    All other systems reviewed and are negative.  EKGs/Labs/Other Studies Reviewed:    The  following studies were reviewed today: .Marland Kitchen   I discussed my findings with the patient at length   Recent Labs: 12/24/2022: BUN 13; Creatinine, Ser 1.31; Potassium 2.9; Sodium 143  Recent Lipid Panel    Component Value Date/Time   CHOL 131 08/09/2022 1059   TRIG 133 08/09/2022 1059   HDL 42 08/09/2022 1059   CHOLHDL 3.1 08/09/2022 1059   LDLCALC 66 08/09/2022 1059    Physical Exam:    VS:  BP 96/68   Pulse 68   Ht 5\' 9"  (1.753 m)   Wt 180 lb 3.2 oz (81.7 kg)   SpO2 97%   BMI 26.61 kg/m     Wt Readings from Last 3 Encounters:  08/30/23 180 lb 3.2 oz (81.7 kg)  05/18/23 173 lb 8 oz (78.7 kg)  02/25/23 179 lb 3.2 oz (81.3 kg)     GEN: Patient is in no acute distress HEENT: Normal NECK: No JVD; No carotid bruits LYMPHATICS: No lymphadenopathy CARDIAC: Hear sounds regular, 2/6 systolic murmur at the apex. RESPIRATORY:  Clear to auscultation without rales, wheezing or rhonchi  ABDOMEN: Soft, non-tender, non-distended MUSCULOSKELETAL:  No edema; No deformity  SKIN: Warm and dry NEUROLOGIC:  Alert and oriented x 3 PSYCHIATRIC:  Normal affect   Signed, Garwin Brothers, MD  08/30/2023 11:18 AM     [facility]

## 2023-08-30 NOTE — Patient Instructions (Signed)

## 2023-09-01 DIAGNOSIS — M545 Low back pain, unspecified: Secondary | ICD-10-CM | POA: Diagnosis not present

## 2023-09-05 DIAGNOSIS — M545 Low back pain, unspecified: Secondary | ICD-10-CM | POA: Diagnosis not present

## 2023-09-12 DIAGNOSIS — M545 Low back pain, unspecified: Secondary | ICD-10-CM | POA: Diagnosis not present

## 2023-09-15 DIAGNOSIS — M545 Low back pain, unspecified: Secondary | ICD-10-CM | POA: Diagnosis not present

## 2023-09-19 ENCOUNTER — Other Ambulatory Visit: Payer: Self-pay | Admitting: Cardiology

## 2023-09-20 DIAGNOSIS — M545 Low back pain, unspecified: Secondary | ICD-10-CM | POA: Diagnosis not present

## 2023-09-23 DIAGNOSIS — M545 Low back pain, unspecified: Secondary | ICD-10-CM | POA: Diagnosis not present

## 2023-09-29 DIAGNOSIS — M545 Low back pain, unspecified: Secondary | ICD-10-CM | POA: Diagnosis not present

## 2023-10-07 DIAGNOSIS — M545 Low back pain, unspecified: Secondary | ICD-10-CM | POA: Diagnosis not present

## 2023-10-12 DIAGNOSIS — M545 Low back pain, unspecified: Secondary | ICD-10-CM | POA: Diagnosis not present

## 2023-10-25 DIAGNOSIS — M51379 Other intervertebral disc degeneration, lumbosacral region without mention of lumbar back pain or lower extremity pain: Secondary | ICD-10-CM | POA: Diagnosis not present

## 2023-10-28 DIAGNOSIS — D225 Melanocytic nevi of trunk: Secondary | ICD-10-CM | POA: Diagnosis not present

## 2023-10-28 DIAGNOSIS — M47816 Spondylosis without myelopathy or radiculopathy, lumbar region: Secondary | ICD-10-CM | POA: Diagnosis not present

## 2023-10-28 DIAGNOSIS — L578 Other skin changes due to chronic exposure to nonionizing radiation: Secondary | ICD-10-CM | POA: Diagnosis not present

## 2023-10-28 DIAGNOSIS — D2239 Melanocytic nevi of other parts of face: Secondary | ICD-10-CM | POA: Diagnosis not present

## 2023-10-28 DIAGNOSIS — L820 Inflamed seborrheic keratosis: Secondary | ICD-10-CM | POA: Diagnosis not present

## 2023-10-28 DIAGNOSIS — L219 Seborrheic dermatitis, unspecified: Secondary | ICD-10-CM | POA: Diagnosis not present

## 2023-11-01 DIAGNOSIS — F5104 Psychophysiologic insomnia: Secondary | ICD-10-CM | POA: Diagnosis not present

## 2023-11-01 DIAGNOSIS — F331 Major depressive disorder, recurrent, moderate: Secondary | ICD-10-CM | POA: Diagnosis not present

## 2023-11-01 DIAGNOSIS — E785 Hyperlipidemia, unspecified: Secondary | ICD-10-CM | POA: Diagnosis not present

## 2023-11-01 DIAGNOSIS — J069 Acute upper respiratory infection, unspecified: Secondary | ICD-10-CM | POA: Diagnosis not present

## 2023-11-01 DIAGNOSIS — I10 Essential (primary) hypertension: Secondary | ICD-10-CM | POA: Diagnosis not present

## 2023-11-01 DIAGNOSIS — I480 Paroxysmal atrial fibrillation: Secondary | ICD-10-CM | POA: Diagnosis not present

## 2023-11-01 DIAGNOSIS — R972 Elevated prostate specific antigen [PSA]: Secondary | ICD-10-CM | POA: Diagnosis not present

## 2023-11-18 ENCOUNTER — Telehealth: Payer: Self-pay | Admitting: Cardiology

## 2023-11-18 DIAGNOSIS — Z0279 Encounter for issue of other medical certificate: Secondary | ICD-10-CM

## 2023-11-18 NOTE — Telephone Encounter (Signed)
 Forms received from [facility] DMV from patient today, 11/18/23. Forms auth and roi signed and scanned with incomplete forms into documents. $29 dollar fee collected in cash and placed in bank bag. Took forms to Dr. Kem Parkinson CMA Karie Schwalbe Lanae Boast for completion.  KBL 11/18/23

## 2023-11-21 NOTE — Telephone Encounter (Signed)
 COMPLETED forms scanned into chart and patient called for pick up/kbl 11/21/23

## 2023-11-24 ENCOUNTER — Ambulatory Visit: Payer: PPO | Admitting: Neurology

## 2024-01-04 DIAGNOSIS — H8102 Meniere's disease, left ear: Secondary | ICD-10-CM | POA: Diagnosis not present

## 2024-01-13 ENCOUNTER — Other Ambulatory Visit: Payer: Self-pay | Admitting: Cardiology

## 2024-01-13 DIAGNOSIS — H9192 Unspecified hearing loss, left ear: Secondary | ICD-10-CM | POA: Diagnosis not present

## 2024-01-13 DIAGNOSIS — I700 Atherosclerosis of aorta: Secondary | ICD-10-CM

## 2024-01-13 DIAGNOSIS — H9312 Tinnitus, left ear: Secondary | ICD-10-CM | POA: Diagnosis not present

## 2024-01-13 NOTE — Telephone Encounter (Signed)
 Prescription refill request for Eliquis  received. Indication: Afib  Last office visit: 08/30/23 (Revankar)  Scr: 1.19 (04/22/23)  Age: 78 Weight: 81.7kg  Appropriate dose. Refill sent.

## 2024-01-24 NOTE — Telephone Encounter (Signed)
 Payment taken into epic/ipay. Process completed./kbl 01/24/24

## 2024-01-31 DIAGNOSIS — F331 Major depressive disorder, recurrent, moderate: Secondary | ICD-10-CM | POA: Diagnosis not present

## 2024-01-31 DIAGNOSIS — R062 Wheezing: Secondary | ICD-10-CM | POA: Diagnosis not present

## 2024-01-31 DIAGNOSIS — E785 Hyperlipidemia, unspecified: Secondary | ICD-10-CM | POA: Diagnosis not present

## 2024-01-31 DIAGNOSIS — M8589 Other specified disorders of bone density and structure, multiple sites: Secondary | ICD-10-CM | POA: Diagnosis not present

## 2024-01-31 DIAGNOSIS — M51379 Other intervertebral disc degeneration, lumbosacral region without mention of lumbar back pain or lower extremity pain: Secondary | ICD-10-CM | POA: Diagnosis not present

## 2024-01-31 DIAGNOSIS — M47816 Spondylosis without myelopathy or radiculopathy, lumbar region: Secondary | ICD-10-CM | POA: Diagnosis not present

## 2024-01-31 DIAGNOSIS — R972 Elevated prostate specific antigen [PSA]: Secondary | ICD-10-CM | POA: Diagnosis not present

## 2024-01-31 DIAGNOSIS — I10 Essential (primary) hypertension: Secondary | ICD-10-CM | POA: Diagnosis not present

## 2024-01-31 DIAGNOSIS — F5104 Psychophysiologic insomnia: Secondary | ICD-10-CM | POA: Diagnosis not present

## 2024-01-31 DIAGNOSIS — I480 Paroxysmal atrial fibrillation: Secondary | ICD-10-CM | POA: Diagnosis not present

## 2024-02-01 ENCOUNTER — Other Ambulatory Visit (HOSPITAL_BASED_OUTPATIENT_CLINIC_OR_DEPARTMENT_OTHER): Payer: Self-pay | Admitting: Internal Medicine

## 2024-02-01 DIAGNOSIS — M8589 Other specified disorders of bone density and structure, multiple sites: Secondary | ICD-10-CM

## 2024-02-01 LAB — COMPREHENSIVE METABOLIC PANEL WITH GFR: EGFR: 91

## 2024-02-10 ENCOUNTER — Ambulatory Visit: Attending: Cardiology

## 2024-02-10 DIAGNOSIS — I10 Essential (primary) hypertension: Secondary | ICD-10-CM

## 2024-02-10 NOTE — Progress Notes (Signed)
   Nurse Visit   Date of Encounter: 02/10/2024 ID: AZION CENTRELLA, DOB 09/16/1945, MRN [id number]  PCP:  Adrian Hopper, MD   [facility] Providers Cardiologist:  None      Visit Details   VS:  There were no vitals taken for this visit. , BMI There is no height or weight on file to calculate BMI.  Wt Readings from Last 3 Encounters:  08/30/23 180 lb 3.2 oz (81.7 kg)  05/18/23 173 lb 8 oz (78.7 kg)  02/25/23 179 lb 3.2 oz (81.3 kg)     Reason for visit: BP elevated. Pt reported being on Prednisone for 3 weeks for ears ringing. Performed today: Vitals. Consulted Dr. Krasowski Changes (medications, testing, etc.) : No Changes. Continue to monitor BP and send readings Length of Visit: 15 minutes    Medications Adjustments/Labs and Tests Ordered: No orders of the defined types were placed in this encounter.  No orders of the defined types were placed in this encounter.    Signed, Remus Carmine, RN  02/10/2024 10:25 AM

## 2024-02-12 ENCOUNTER — Other Ambulatory Visit: Payer: Self-pay | Admitting: Cardiology

## 2024-02-13 ENCOUNTER — Ambulatory Visit (HOSPITAL_BASED_OUTPATIENT_CLINIC_OR_DEPARTMENT_OTHER)
Admission: RE | Admit: 2024-02-13 | Discharge: 2024-02-13 | Disposition: A | Discharge status: Home / Self Care | Source: Ambulatory Visit | Attending: Internal Medicine | Admitting: Internal Medicine

## 2024-02-13 DIAGNOSIS — M85852 Other specified disorders of bone density and structure, left thigh: Secondary | ICD-10-CM | POA: Diagnosis not present

## 2024-02-13 DIAGNOSIS — Z13820 Encounter for screening for osteoporosis: Secondary | ICD-10-CM

## 2024-02-13 DIAGNOSIS — M85851 Other specified disorders of bone density and structure, right thigh: Secondary | ICD-10-CM | POA: Diagnosis not present

## 2024-02-13 DIAGNOSIS — M8589 Other specified disorders of bone density and structure, multiple sites: Secondary | ICD-10-CM

## 2024-02-19 ENCOUNTER — Encounter (HOSPITAL_BASED_OUTPATIENT_CLINIC_OR_DEPARTMENT_OTHER): Payer: Self-pay

## 2024-02-19 ENCOUNTER — Ambulatory Visit (HOSPITAL_BASED_OUTPATIENT_CLINIC_OR_DEPARTMENT_OTHER): Admission: EM | Admit: 2024-02-19 | Discharge: 2024-02-19 | Disposition: A | Discharge status: Home / Self Care

## 2024-02-19 DIAGNOSIS — I7121 Aneurysm of the ascending aorta, without rupture: Secondary | ICD-10-CM | POA: Diagnosis not present

## 2024-02-19 DIAGNOSIS — I444 Left anterior fascicular block: Secondary | ICD-10-CM | POA: Diagnosis not present

## 2024-02-19 DIAGNOSIS — I129 Hypertensive chronic kidney disease with stage 1 through stage 4 chronic kidney disease, or unspecified chronic kidney disease: Secondary | ICD-10-CM | POA: Diagnosis not present

## 2024-02-19 DIAGNOSIS — Z7982 Long term (current) use of aspirin: Secondary | ICD-10-CM | POA: Diagnosis not present

## 2024-02-19 DIAGNOSIS — Z952 Presence of prosthetic heart valve: Secondary | ICD-10-CM | POA: Diagnosis not present

## 2024-02-19 DIAGNOSIS — I2510 Atherosclerotic heart disease of native coronary artery without angina pectoris: Secondary | ICD-10-CM | POA: Diagnosis not present

## 2024-02-19 DIAGNOSIS — R7989 Other specified abnormal findings of blood chemistry: Secondary | ICD-10-CM | POA: Diagnosis not present

## 2024-02-19 DIAGNOSIS — R079 Chest pain, unspecified: Secondary | ICD-10-CM | POA: Diagnosis not present

## 2024-02-19 DIAGNOSIS — I447 Left bundle-branch block, unspecified: Secondary | ICD-10-CM | POA: Diagnosis not present

## 2024-02-19 DIAGNOSIS — N189 Chronic kidney disease, unspecified: Secondary | ICD-10-CM | POA: Diagnosis not present

## 2024-02-19 DIAGNOSIS — I4891 Unspecified atrial fibrillation: Secondary | ICD-10-CM | POA: Diagnosis not present

## 2024-02-19 DIAGNOSIS — H9313 Tinnitus, bilateral: Secondary | ICD-10-CM

## 2024-02-19 DIAGNOSIS — I517 Cardiomegaly: Secondary | ICD-10-CM | POA: Diagnosis not present

## 2024-02-19 DIAGNOSIS — R002 Palpitations: Secondary | ICD-10-CM | POA: Diagnosis not present

## 2024-02-19 DIAGNOSIS — H8109 Meniere's disease, unspecified ear: Secondary | ICD-10-CM | POA: Diagnosis not present

## 2024-02-19 DIAGNOSIS — Z79899 Other long term (current) drug therapy: Secondary | ICD-10-CM | POA: Diagnosis not present

## 2024-02-19 DIAGNOSIS — Z8673 Personal history of transient ischemic attack (TIA), and cerebral infarction without residual deficits: Secondary | ICD-10-CM | POA: Diagnosis not present

## 2024-02-19 DIAGNOSIS — Z7901 Long term (current) use of anticoagulants: Secondary | ICD-10-CM | POA: Diagnosis not present

## 2024-02-19 DIAGNOSIS — I480 Paroxysmal atrial fibrillation: Secondary | ICD-10-CM | POA: Diagnosis not present

## 2024-02-19 DIAGNOSIS — I700 Atherosclerosis of aorta: Secondary | ICD-10-CM | POA: Diagnosis not present

## 2024-02-19 DIAGNOSIS — R000 Tachycardia, unspecified: Secondary | ICD-10-CM | POA: Diagnosis not present

## 2024-02-19 DIAGNOSIS — E7800 Pure hypercholesterolemia, unspecified: Secondary | ICD-10-CM | POA: Diagnosis not present

## 2024-02-19 DIAGNOSIS — R0602 Shortness of breath: Secondary | ICD-10-CM | POA: Diagnosis not present

## 2024-02-19 NOTE — Discharge Instructions (Signed)
 Recommend follow-up with your ear nose and throat specialist on Thursday as planned.  In the meantime you can try some eardrops over-the-counter called lipo flavonoid plus to see if this will help. Also recommend doing daily Zyrtec and Flonase for allergies over-the-counter Wearing hearing aids enhances this so you may want to think using these as less as possible.

## 2024-02-19 NOTE — ED Triage Notes (Signed)
 Awakened with ringing in both ears. Seen by pcp a few weeks ago then sent to ENT. Was placed on steroids but blood pressure became very high. To have audiology testing 6/19. Patients gait is unsteady. Spouse states this is normal. Given meclizine this morning.

## 2024-02-19 NOTE — ED Provider Notes (Signed)
 [facility]    CSN: [id number] Arrival date & time: 02/19/24  0949      History   Chief Complaint Chief Complaint  Patient presents with   Tinnitus    HPI Randall Edwards is a 78 y.o. male.   78 year old male presents today with tinnitus.  This been going on for the past few weeks but has worsened over the past few days.  Has become constant and milder.  Was seen by ear nose and throat specialist most recently and prescribed prednisone which he was unable to take due to blood pressure elevation.  He does wear hearing aids.  He is having some audiology testing on 6/19.  Gait is unsteady but this is normal for him.  Had meclizine this morning.  No current dizziness, blurry vision, headache, nasal congestion or rhinorrhea     Past Medical History:  Diagnosis Date   Aortic atherosclerosis (HCC) 06/18/2021   Noted on 2020 CTA   Ascending aorta dilatation (HCC) 05/16/2019   Ataxia    Atrial tachycardia (HCC)    s/p ablation and on sotolol   BPH (benign prostatic hyperplasia)    Chronic frontal sinusitis 09/12/2014   Chronic insomnia    Chronic sphenoidal sinusitis 09/12/2014   CKD (chronic kidney disease)    Depression    Dyslipidemia 03/18/2015   Essential hypertension 03/18/2015   Fall 12/09/2022   GERD (gastroesophageal reflux disease)    H/O cardiac radiofrequency ablation 08/2016   Hyperlipidemia    Hypertension    Hypertrophy of both inferior nasal turbinates 09/12/2014   Memory loss    Meniere's disease of left ear    Mixed dyslipidemia 10/13/2022   Multiple nasal polyps 09/12/2014   Nasal septal deviation 09/12/2014   PAF (paroxysmal atrial fibrillation) (HCC) 12/17/2020   S/P aorta repair    S/P AVR (aortic valve replacement) 2010   s/p valve-in-valve TAVR 07/17/2019   s/p VIV TAVR via the TA approach after failed TF case 07/03/19   Severe aortic stenosis 05/16/2019   Status post radiofrequency ablation for arrhythmia 03/18/2015   Stroke (HCC)  12/09/2022   TIA (transient ischemic attack)    Vertigo     Patient Active Problem List   Diagnosis Date Noted   Stroke [facility]) 12/09/2022   Fall 12/09/2022   Mixed dyslipidemia 10/13/2022   Ataxia 10/07/2022   Chronic insomnia 10/07/2022   CKD (chronic kidney disease) 10/07/2022   Depression 10/07/2022   Memory loss 10/07/2022   Meniere's disease of left ear 10/07/2022   TIA (transient ischemic attack) 10/07/2022   Aortic atherosclerosis (HCC) 06/18/2021   PAF (paroxysmal atrial fibrillation) (HCC) 12/17/2020   BPH (benign prostatic hyperplasia)    GERD (gastroesophageal reflux disease)    Hyperlipidemia    Vertigo    Hypertension    S/P aorta repair    s/p valve-in-valve TAVR 07/17/2019   Severe aortic stenosis 05/16/2019   Ascending aorta dilatation (HCC) 05/16/2019   S/P AVR (aortic valve replacement) 06/03/2017   H/O cardiac radiofrequency ablation 08/2016   Atrial tachycardia (HCC) 07/28/2016   Dyslipidemia 03/18/2015   Essential hypertension 03/18/2015   Status post radiofrequency ablation for arrhythmia 03/18/2015   Chronic frontal sinusitis 09/12/2014   Chronic sphenoidal sinusitis 09/12/2014   Hypertrophy of both inferior nasal turbinates 09/12/2014   Multiple nasal polyps 09/12/2014   Nasal septal deviation 09/12/2014    Past Surgical History:  Procedure Laterality Date   ABLATION OF DYSRHYTHMIC FOCUS     CARDIAC VALVE REPLACEMENT  CHOLECYSTECTOMY     KIDNEY STONE SURGERY     KNEE ARTHROSCOPY Bilateral    MASS EXCISION Right 12/26/2017   Procedure: RIGHT INDEX EXCISION CYST AND DEBRIDEMENT OF DISTAL INTERPHALANGEAL JOINT;  Surgeon: Brunilda Capra, MD;  Location: [facility];  Service: Orthopedics;  Laterality: Right;  Bier block   RIGHT HEART CATH AND CORONARY ANGIOGRAPHY N/A 06/20/2019   Procedure: RIGHT HEART CATH AND CORONARY ANGIOGRAPHY;  Surgeon: Arnoldo Lapping, MD;  Location: [facility] INVASIVE CV LAB;  Service: Cardiovascular;  Laterality:  N/A;   TEE WITHOUT CARDIOVERSION N/A 07/03/2019   Procedure: TRANSESOPHAGEAL ECHOCARDIOGRAM (TEE);  Surgeon: Arnoldo Lapping, MD;  Location: [facility] OR;  Service: Open Heart Surgery;  Laterality: N/A;   TEE WITHOUT CARDIOVERSION N/A 07/17/2019   Procedure: TRANSESOPHAGEAL ECHOCARDIOGRAM (TEE);  Surgeon: Arnoldo Lapping, MD;  Location: [facility] OR;  Service: Open Heart Surgery;  Laterality: N/A;   TRANSCATHETER AORTIC VALVE REPLACEMENT, TRANSAPICAL N/A 07/17/2019   Procedure: TRANSCATHETER AORTIC VALVE REPLACEMENT, TRANSAPICAL;  Surgeon: Arnoldo Lapping, MD;  Location: [facility] OR;  Service: Open Heart Surgery;  Laterality: N/A;   TRANSCATHETER AORTIC VALVE REPLACEMENT, TRANSFEMORAL N/A 07/03/2019   Procedure: attempted TRANSCATHETER AORTIC VALVE REPLACEMENT, TRANSFEMORAL;  Surgeon: Arnoldo Lapping, MD;  Location: [facility] OR;  Service: Open Heart Surgery;  Laterality: N/A;       Home Medications    Prior to Admission medications   Medication Sig Start Date End Date Taking? Authorizing Provider  apixaban  (ELIQUIS ) 5 MG TABS tablet Take 1 tablet (5 mg total) by mouth 2 (two) times daily. 08/30/23   Revankar, Micael Adas, MD  cyanocobalamin  (VITAMIN B12) 1000 MCG tablet Take 1,000 mcg by mouth daily. 06/28/22   [provider]  diazepam  (VALIUM ) 2 MG tablet Take 2 mg by mouth daily. 08/31/22   [provider]  DULoxetine (CYMBALTA) 60 MG capsule Take 60 mg by mouth daily. 09/26/20   [provider]  meclizine (ANTIVERT) 25 MG tablet Take 25 mg by mouth 3 (three) times daily as needed for dizziness. 02/20/21   [provider]  ondansetron  (ZOFRAN -ODT) 4 MG disintegrating tablet Take 4 mg by mouth every 4 (four) hours as needed. 08/25/23   [provider]  pantoprazole  (PROTONIX ) 40 MG tablet Take 40 mg by mouth daily. 09/24/21   [provider]  potassium chloride  SA (KLOR-CON  M) 20 MEQ tablet Take 20 mEq by mouth 2 (two) times daily. 01/04/23   [provider]   rosuvastatin  (CRESTOR ) 10 MG tablet Take 10 mg by mouth daily. 01/20/23   [provider]  sotalol  (BETAPACE ) 80 MG tablet Take 1 tablet (80 mg total) by mouth 2 (two) times daily. 09/19/23   Revankar, Micael Adas, MD  tamsulosin  (FLOMAX ) 0.4 MG CAPS capsule Take 0.4 mg by mouth at bedtime. 06/07/23   [provider]  temazepam  (RESTORIL ) 15 MG capsule Take 15 mg by mouth at bedtime.  03/26/17   [provider]  triamterene -hydrochlorothiazide  (MAXZIDE -25) 37.5-25 MG tablet Take 1 tablet by mouth daily. 02/13/24   Revankar, Micael Adas, MD    Family History Family History  Problem Relation Age of Onset   CAD Brother     Social History Social History   Tobacco Use   Smoking status: Never   Smokeless tobacco: Never  Vaping Use   Vaping status: Never Used  Substance Use Topics   Alcohol use: No   Drug use: No     Allergies   Patient has no known allergies.   Review of  Systems Review of Systems See HPI  Physical Exam Triage Vital Signs ED Triage Vitals  Encounter Vitals Group     BP 02/19/24 1033 (!) 152/88     Systolic BP Percentile --      Diastolic BP Percentile --      Pulse Rate 02/19/24 1033 (!) 51     Resp 02/19/24 1033 20     Temp 02/19/24 1033 (!) 97.3 F (36.3 C)     Temp Source 02/19/24 1033 Oral     SpO2 02/19/24 1033 95 %     Weight --      Height --      Head Circumference --      Peak Flow --      Pain Score 02/19/24 1035 0     Pain Loc --      Pain Education --      Exclude from Growth Chart --    No data found.  Updated Vital Signs BP (!) 152/88 (BP Location: Right Arm)   Pulse (!) 51   Temp (!) 97.3 F (36.3 C) (Oral)   Resp 20   SpO2 95%   Visual Acuity Right Eye Distance:   Left Eye Distance:   Bilateral Distance:    Right Eye Near:   Left Eye Near:    Bilateral Near:     Physical Exam Constitutional:      General: He is not in acute distress.    Appearance: Normal appearance. He is not ill-appearing,  toxic-appearing or diaphoretic.  HENT:     Right Ear: Tympanic membrane, ear canal and external ear normal.     Left Ear: Tympanic membrane, ear canal and external ear normal.  Pulmonary:     Effort: Pulmonary effort is normal.  Musculoskeletal:        General: Normal range of motion.  Neurological:     Mental Status: He is alert.  Psychiatric:        Mood and Affect: Mood normal.      UC Treatments / Results  Labs (all labs ordered are listed, but only abnormal results are displayed) Labs Reviewed - No data to display  EKG   Radiology No results found.  Procedures Procedures (including critical care time)  Medications Ordered in UC Medications - No data to display  Initial Impression / Assessment and Plan / UC Course  I have reviewed the triage vital signs and the nursing notes.  Pertinent labs & imaging results that were available during my care of the patient were reviewed by me and considered in my medical decision making (see chart for details).     Tinnitus of both ears-unsure of specific cause.  No signs of ear abnormality on exam.  Patient does wear hearing aids.  This has been known to make tinnitus worse.  Recommend take a break from the hearing aids for now if possible.  In the meantime he can try some eardrops over-the-counter called Lipoflavonoid plus to see if this will help.  Also recommended Flonase and Zyrtec for allergies over-the-counter.  Has appointment on Thursday with ear nose and throat and recommended follow-up with them as planned. Final Clinical Impressions(s) / UC Diagnoses   Final diagnoses:  Tinnitus of both ears     Discharge Instructions      Recommend follow-up with your ear nose and throat specialist on Thursday as planned.  In the meantime you can try some eardrops over-the-counter called lipo flavonoid plus to see if this will help.  Also recommend doing daily Zyrtec and Flonase for allergies over-the-counter Wearing hearing aids  enhances this so you may want to think using these as less as possible.  ED Prescriptions   None    PDMP not reviewed this encounter.   Landa Pine, FNP 02/19/24 1555

## 2024-02-29 DIAGNOSIS — Z952 Presence of prosthetic heart valve: Secondary | ICD-10-CM | POA: Diagnosis not present

## 2024-02-29 DIAGNOSIS — I10 Essential (primary) hypertension: Secondary | ICD-10-CM | POA: Diagnosis not present

## 2024-02-29 DIAGNOSIS — I480 Paroxysmal atrial fibrillation: Secondary | ICD-10-CM | POA: Diagnosis not present

## 2024-03-03 DIAGNOSIS — L820 Inflamed seborrheic keratosis: Secondary | ICD-10-CM | POA: Diagnosis not present

## 2024-03-03 DIAGNOSIS — L578 Other skin changes due to chronic exposure to nonionizing radiation: Secondary | ICD-10-CM | POA: Diagnosis not present

## 2024-03-03 DIAGNOSIS — L570 Actinic keratosis: Secondary | ICD-10-CM | POA: Diagnosis not present

## 2024-03-03 DIAGNOSIS — D2239 Melanocytic nevi of other parts of face: Secondary | ICD-10-CM | POA: Diagnosis not present

## 2024-03-03 DIAGNOSIS — D485 Neoplasm of uncertain behavior of skin: Secondary | ICD-10-CM | POA: Diagnosis not present

## 2024-03-03 DIAGNOSIS — D225 Melanocytic nevi of trunk: Secondary | ICD-10-CM | POA: Diagnosis not present

## 2024-03-05 ENCOUNTER — Other Ambulatory Visit: Payer: Self-pay

## 2024-03-06 ENCOUNTER — Ambulatory Visit: Attending: Cardiology | Admitting: Cardiology

## 2024-03-06 ENCOUNTER — Encounter: Payer: Self-pay | Admitting: Cardiology

## 2024-03-06 VITALS — BP 122/86 | HR 85 | Ht 69.0 in | Wt 191.6 lb

## 2024-03-06 DIAGNOSIS — I10 Essential (primary) hypertension: Secondary | ICD-10-CM

## 2024-03-06 DIAGNOSIS — E782 Mixed hyperlipidemia: Secondary | ICD-10-CM

## 2024-03-06 DIAGNOSIS — I480 Paroxysmal atrial fibrillation: Secondary | ICD-10-CM

## 2024-03-06 DIAGNOSIS — I77810 Thoracic aortic ectasia: Secondary | ICD-10-CM

## 2024-03-06 DIAGNOSIS — I700 Atherosclerosis of aorta: Secondary | ICD-10-CM

## 2024-03-06 MED ORDER — DILTIAZEM HCL ER COATED BEADS 120 MG PO CP24: 120.0000 mg | ORAL_CAPSULE | Freq: Every day | ORAL | 3 refills | Status: DC

## 2024-03-06 NOTE — Patient Instructions (Signed)

## 2024-03-06 NOTE — Progress Notes (Signed)
 Cardiology Office Note:    Date:  03/06/2024   ID:  Randall Edwards, DOB 1946-01-26, MRN [id number]  PCP:  Keren Vicenta BRAVO, MD  Cardiologist:  Jennifer JONELLE Crape, MD   Referring MD: Keren Vicenta BRAVO, MD    ASSESSMENT:    1. Mixed dyslipidemia   2. Aortic atherosclerosis (HCC)   3. Ascending aorta dilatation (HCC)   4. Essential hypertension   5. PAF (paroxysmal atrial fibrillation) (HCC)    PLAN:    In order of problems listed above:  Primary prevention stressed with the patient.  Importance of compliance with diet medication stressed and patient verbalized standing. Essential hypertension: Blood pressure is stable and diet was emphasized. Paroxysmal atrial fibrillation:I discussed with the patient atrial fibrillation, disease process. Management and therapy including rate and rhythm control, anticoagulation benefits and potential risks were discussed extensively with the patient. Patient had multiple questions which were answered to patient's satisfaction. Mixed dyslipidemia: On lipid-lowering medications followed by primary care. Ascending aortic dilatation: Stable at this time.  Symptoms educated to the patient.  Will recheck in 6 months. Post TAVR: Stable.  Will monitor. Patient will be seen in follow-up appointment in 6 months or earlier if the patient has any concerns.    Medication Adjustments/Labs and Tests Ordered: Current medicines are reviewed at length with the patient today.  Concerns regarding medicines are outlined above.  Orders Placed This Encounter  Procedures   EKG 12-Lead   Meds ordered this encounter  Medications   diltiazem (CARDIZEM CD) 120 MG 24 hr capsule    Sig: Take 1 capsule (120 mg total) by mouth daily.    Dispense:  90 capsule    Refill:  3     No chief complaint on file.    History of Present Illness:    Randall Edwards is a 78 y.o. male.  Patient has past medical history of ascending aortic dilatation, mixed dyslipidemia,  paroxysmal atrial fibrillation, essential hypertension.  He denies any problems at this time and takes care of activities of daily living.  He ambulates with a walker.  He is post TAVR.  He was admitted to the hospital with paroxysmal atrial fibrillation and this was treated and since discharge she has not had any issues.  At the time of my evaluation, the patient is alert awake oriented and in no distress.  Past Medical History:  Diagnosis Date   Aortic atherosclerosis (HCC) 06/18/2021   Noted on 2020 CTA   Ascending aorta dilatation (HCC) 05/16/2019   Ataxia    Atrial tachycardia (HCC)    s/p ablation and on sotolol   BPH (benign prostatic hyperplasia)    Chronic frontal sinusitis 09/12/2014   Chronic insomnia    Chronic sphenoidal sinusitis 09/12/2014   CKD (chronic kidney disease)    Depression    Dyslipidemia 03/18/2015   Essential hypertension 03/18/2015   Fall 12/09/2022   GERD (gastroesophageal reflux disease)    H/O cardiac radiofrequency ablation 08/2016   Hyperlipidemia    Hypertension    Hypertrophy of both inferior nasal turbinates 09/12/2014   Memory loss    Meniere's disease of left ear    Mixed dyslipidemia 10/13/2022   Multiple nasal polyps 09/12/2014   Nasal septal deviation 09/12/2014   PAF (paroxysmal atrial fibrillation) (HCC) 12/17/2020   S/P aorta repair    S/P AVR (aortic valve replacement) 2010   s/p valve-in-valve TAVR 07/17/2019   s/p VIV TAVR via the TA approach after failed TF case 07/03/19  Severe aortic stenosis 05/16/2019   Status post radiofrequency ablation for arrhythmia 03/18/2015   Stroke (HCC) 12/09/2022   TIA (transient ischemic attack)    Vertigo     Past Surgical History:  Procedure Laterality Date   ABLATION OF DYSRHYTHMIC FOCUS     CARDIAC VALVE REPLACEMENT     CHOLECYSTECTOMY     KIDNEY STONE SURGERY     KNEE ARTHROSCOPY Bilateral    MASS EXCISION Right 12/26/2017   Procedure: RIGHT INDEX EXCISION CYST AND DEBRIDEMENT OF  DISTAL INTERPHALANGEAL JOINT;  Surgeon: Murrell Drivers, MD;  Location: [facility];  Service: Orthopedics;  Laterality: Right;  Bier block   RIGHT HEART CATH AND CORONARY ANGIOGRAPHY N/A 06/20/2019   Procedure: RIGHT HEART CATH AND CORONARY ANGIOGRAPHY;  Surgeon: Wonda Sharper, MD;  Location: [facility] INVASIVE CV LAB;  Service: Cardiovascular;  Laterality: N/A;   TEE WITHOUT CARDIOVERSION N/A 07/03/2019   Procedure: TRANSESOPHAGEAL ECHOCARDIOGRAM (TEE);  Surgeon: Wonda Sharper, MD;  Location: [facility] OR;  Service: Open Heart Surgery;  Laterality: N/A;   TEE WITHOUT CARDIOVERSION N/A 07/17/2019   Procedure: TRANSESOPHAGEAL ECHOCARDIOGRAM (TEE);  Surgeon: Wonda Sharper, MD;  Location: [facility] OR;  Service: Open Heart Surgery;  Laterality: N/A;   TRANSCATHETER AORTIC VALVE REPLACEMENT, TRANSAPICAL N/A 07/17/2019   Procedure: TRANSCATHETER AORTIC VALVE REPLACEMENT, TRANSAPICAL;  Surgeon: Wonda Sharper, MD;  Location[facility] OR;  Service: Open Heart Surgery;  Laterality: N/A;   TRANSCATHETER AORTIC VALVE REPLACEMENT, TRANSFEMORAL N/A 07/03/2019   Procedure: attempted TRANSCATHETER AORTIC VALVE REPLACEMENT, TRANSFEMORAL;  Surgeon: Wonda Sharper, MD;  Location: [facility] OR;  Service: Open Heart Surgery;  Laterality: N/A;    Current Medications: Current Meds  Medication Sig   apixaban  (ELIQUIS ) 5 MG TABS tablet Take 1 tablet (5 mg total) by mouth 2 (two) times daily.   Calcium  Carb-Cholecalciferol (CALCIUM  600 + D PO) Take 1 tablet by mouth 2 (two) times daily.   cyanocobalamin  (VITAMIN B12) 1000 MCG tablet Take 1,000 mcg by mouth daily.   diazepam  (VALIUM ) 2 MG tablet Take 2 mg by mouth 2 (two) times daily.   DULoxetine (CYMBALTA) 60 MG capsule Take 60 mg by mouth daily.   meclizine (ANTIVERT) 25 MG tablet Take 25 mg by mouth 3 (three) times daily as needed for dizziness.   Multiple Vitamins-Minerals (CENTRUM ADULT PO) Take 1 tablet by mouth daily.   Omega-3 Fatty Acids (FISH OIL PO) Take 1 tablet by mouth  2 (two) times daily.   ondansetron  (ZOFRAN -ODT) 4 MG disintegrating tablet Take 4 mg by mouth every 4 (four) hours as needed for nausea or vomiting.   pantoprazole  (PROTONIX ) 40 MG tablet Take 40 mg by mouth daily.   potassium chloride  SA (KLOR-CON  M) 20 MEQ tablet Take 20 mEq by mouth 2 (two) times daily.   rosuvastatin  (CRESTOR ) 10 MG tablet Take 10 mg by mouth daily.   sotalol  (BETAPACE ) 80 MG tablet Take 1 tablet (80 mg total) by mouth 2 (two) times daily.   tamsulosin  (FLOMAX ) 0.4 MG CAPS capsule Take 0.4 mg by mouth at bedtime.   temazepam  (RESTORIL ) 15 MG capsule Take 15 mg by mouth at bedtime.    triamterene -hydrochlorothiazide  (MAXZIDE -25) 37.5-25 MG tablet Take 1 tablet by mouth daily.   [DISCONTINUED] diltiazem (CARDIZEM CD) 120 MG 24 hr capsule Take 120 mg by mouth daily.     Allergies:   Patient has no known allergies.   Social History   Socioeconomic History   Marital status: Married    Spouse name: Not on file  Number of children: 1   Years of education: Not on file   Highest education level: Not on file  Occupational History    Comment: police officer  Tobacco Use   Smoking status: Never   Smokeless tobacco: Never  Vaping Use   Vaping status: Never Used  Substance and Sexual Activity   Alcohol use: No   Drug use: No   Sexual activity: Not on file  Other Topics Concern   Not on file  Social History Narrative   Lives with wife   retired   [employer] Strain: Not on file  Food Insecurity: Low Risk  (02/20/2024)   Received from [facility]   Hunger Vital Sign    Within the past 12 months, you worried that your food would run out before you got money to buy more: Never true    Within the past 12 months, the food you bought just didn't last and you didn't have money to get more. : Never true  Transportation Needs: No Transportation Needs (02/20/2024)   Received from [employer]    In the past 12 months,  has lack of reliable transportation kept you from medical appointments, meetings, work or from getting things needed for daily living? : No  Physical Activity: Not on file  Stress: Not on file  Social Connections: Not on file     Family History: The patient's family history includes CAD in his brother.  ROS:   Please see the history of present illness.    All other systems reviewed and are negative.  EKGs/Labs/Other Studies Reviewed:    The following studies were reviewed today: I discussed my findings with the patient at length .SABRAEKG Interpretation Date/Time:  Tuesday March 06 2024 10:08:25 EDT Ventricular Rate:  85 PR Interval:  204 QRS Duration:  112 QT Interval:  402 QTC Calculation: 478 R Axis:   14  Text Interpretation: Normal sinus rhythm Anterior infarct , age undetermined Abnormal ECG When compared with ECG of 18-Jul-2019 06:55, Anterior infarct is now Present ST depression has replaced ST elevation in Inferior leads ST no longer elevated in Anterior leads ST no longer depressed in Lateral leads Nonspecific T wave abnormality now evident in Inferior leads T wave inversion no longer evident in Anterolateral leads Confirmed by Edwyna Backers [phone]) on 03/06/2024 10:24:33 AM     Recent Labs: No results found for requested labs within last 365 days.  Recent Lipid Panel    Component Value Date/Time   CHOL 131 08/09/2022 1059   TRIG 133 08/09/2022 1059   HDL 42 08/09/2022 1059   CHOLHDL 3.1 08/09/2022 1059   LDLCALC 66 08/09/2022 1059    Physical Exam:    VS:  BP 122/86   Pulse 85   Ht 5' 9 (1.753 m)   Wt 191 lb 9.6 oz (86.9 kg)   SpO2 93%   BMI 28.29 kg/m     Wt Readings from Last 3 Encounters:  03/06/24 191 lb 9.6 oz (86.9 kg)  02/10/24 195 lb (88.5 kg)  08/30/23 180 lb 3.2 oz (81.7 kg)     GEN: Patient is in no acute distress HEENT: Normal NECK: No JVD; No carotid bruits LYMPHATICS: No lymphadenopathy CARDIAC: Hear sounds regular, 2/6 systolic  murmur at the apex. RESPIRATORY:  Clear to auscultation without rales, wheezing or rhonchi  ABDOMEN: Soft, non-tender, non-distended MUSCULOSKELETAL:  No edema; No deformity  SKIN: Warm and dry NEUROLOGIC:  Alert and  oriented x 3 PSYCHIATRIC:  Normal affect   Signed, Jennifer JONELLE Crape, MD  03/06/2024 10:36 AM    [facility]

## 2024-03-12 DIAGNOSIS — F331 Major depressive disorder, recurrent, moderate: Secondary | ICD-10-CM | POA: Diagnosis not present

## 2024-03-19 DIAGNOSIS — I480 Paroxysmal atrial fibrillation: Secondary | ICD-10-CM | POA: Diagnosis not present

## 2024-03-19 DIAGNOSIS — R0600 Dyspnea, unspecified: Secondary | ICD-10-CM | POA: Diagnosis not present

## 2024-03-20 ENCOUNTER — Telehealth: Payer: Self-pay | Admitting: Cardiology

## 2024-03-20 NOTE — Telephone Encounter (Signed)
 Pt c/o Shortness Of Breath: STAT if SOB developed within the last 24 hours or pt is noticeably SOB on the phone  1. Are you currently SOB (can you hear that pt is SOB on the phone)? No   2. How long have you been experiencing SOB? 3 weeks   3. Are you SOB when sitting or when up moving around? Both   4. Are you currently experiencing any other symptoms?  No

## 2024-03-20 NOTE — Telephone Encounter (Signed)
 Wife calling to check status of message

## 2024-03-20 NOTE — Telephone Encounter (Signed)
 Spoke with [name] who states that pt is having increased shortness of breath. States he saw Dr. Keren yesterday and had labs. BNP was normal. States that his EKG was normal yesterday. Advised to call 911 and go to the ED for an evaluation as he could have a blood clot in in lungs. [name] verbalized understanding and had no additional question.

## 2024-04-06 DIAGNOSIS — M47816 Spondylosis without myelopathy or radiculopathy, lumbar region: Secondary | ICD-10-CM | POA: Diagnosis not present

## 2024-04-12 DIAGNOSIS — E785 Hyperlipidemia, unspecified: Secondary | ICD-10-CM | POA: Diagnosis not present

## 2024-04-12 DIAGNOSIS — I480 Paroxysmal atrial fibrillation: Secondary | ICD-10-CM | POA: Diagnosis not present

## 2024-04-12 DIAGNOSIS — I10 Essential (primary) hypertension: Secondary | ICD-10-CM | POA: Diagnosis not present

## 2024-04-12 DIAGNOSIS — F331 Major depressive disorder, recurrent, moderate: Secondary | ICD-10-CM | POA: Diagnosis not present

## 2024-04-12 DIAGNOSIS — K219 Gastro-esophageal reflux disease without esophagitis: Secondary | ICD-10-CM | POA: Diagnosis not present

## 2024-04-17 DIAGNOSIS — M47816 Spondylosis without myelopathy or radiculopathy, lumbar region: Secondary | ICD-10-CM | POA: Diagnosis not present

## 2024-04-24 ENCOUNTER — Ambulatory Visit: Attending: Cardiology | Admitting: Cardiology

## 2024-04-24 ENCOUNTER — Encounter: Payer: Self-pay | Admitting: Cardiology

## 2024-04-24 VITALS — BP 126/74 | HR 91 | Ht 68.0 in | Wt 196.6 lb

## 2024-04-24 DIAGNOSIS — I700 Atherosclerosis of aorta: Secondary | ICD-10-CM

## 2024-04-24 DIAGNOSIS — G459 Transient cerebral ischemic attack, unspecified: Secondary | ICD-10-CM | POA: Diagnosis not present

## 2024-04-24 DIAGNOSIS — I77810 Thoracic aortic ectasia: Secondary | ICD-10-CM

## 2024-04-24 DIAGNOSIS — Z952 Presence of prosthetic heart valve: Secondary | ICD-10-CM | POA: Diagnosis not present

## 2024-04-24 DIAGNOSIS — I480 Paroxysmal atrial fibrillation: Secondary | ICD-10-CM | POA: Diagnosis not present

## 2024-04-24 DIAGNOSIS — Z8679 Personal history of other diseases of the circulatory system: Secondary | ICD-10-CM | POA: Diagnosis not present

## 2024-04-24 DIAGNOSIS — I10 Essential (primary) hypertension: Secondary | ICD-10-CM

## 2024-04-24 DIAGNOSIS — Z9889 Other specified postprocedural states: Secondary | ICD-10-CM | POA: Diagnosis not present

## 2024-04-24 DIAGNOSIS — E782 Mixed hyperlipidemia: Secondary | ICD-10-CM

## 2024-04-24 MED ORDER — METOPROLOL TARTRATE 100 MG PO TABS: 100.0000 mg | ORAL_TABLET | Freq: Once | ORAL | 0 refills | Status: AC

## 2024-04-24 NOTE — Progress Notes (Signed)
 Cardiology Office Note:    Date:  04/24/2024   ID:  Randall Edwards, DOB 05-12-1946, MRN [id number]  PCP:  Keren Vicenta BRAVO, MD  Cardiologist:  Jennifer JONELLE Crape, MD   Referring MD: Keren Vicenta BRAVO, MD    ASSESSMENT:    1. Mixed dyslipidemia   2. Aortic atherosclerosis (HCC)   3. Ascending aorta dilatation (HCC)   4. Essential hypertension   5. PAF (paroxysmal atrial fibrillation) (HCC)   6. TIA (transient ischemic attack)   7. s/p valve-in-valve TAVR   8. Status post radiofrequency ablation for arrhythmia   9. Mixed hyperlipidemia    PLAN:    In order of problems listed above:  Primary prevention stressed with the patient.  Importance of compliance with diet medication stressed and patient verbalized standing. Dyspnea on exertion: This could be multifactorial.  I would like to get an echocardiogram to assess his cardiac and valvular anatomy.  He is agreeable.  I would also like to do a CT coronary angiogram for further to rule out obstructive coronary artery disease and ischemic substrate as a cause of the symptoms.  He is agreeable.  Will set him up for this. Essential hypertension: Blood pressure is stable and diet is emphasized.  Lifestyle modification urged. Mixed dyslipidemia: On lipid-lowering medications followed by primary care. Post valve in valve TAVR: Clinically appears stable and we will wait for echo results. Patient will be seen in follow-up appointment in 6 months or earlier if the patient has any concerns.    Medication Adjustments/Labs and Tests Ordered: Current medicines are reviewed at length with the patient today.  Concerns regarding medicines are outlined above.  Orders Placed This Encounter  Procedures   EKG 12-Lead   No orders of the defined types were placed in this encounter.    No chief complaint on file.    History of Present Illness:    Randall Edwards is a 78 y.o. male.  Patient has past medical history of aortic atherosclerosis,  ascending aortic dilatation essential hypertension paroxysmal defibrillation history of TIA, valve in valve TAVR with radiofrequency ablation for arrhythmia.  He mentions to me that he is noticing to be short of breath.  His primary care has referred him back to the same place.  Past Medical History:  Diagnosis Date   Aortic atherosclerosis (HCC) 06/18/2021   Noted on 2020 CTA   Ascending aorta dilatation (HCC) 05/16/2019   Ataxia    Atrial tachycardia (HCC)    s/p ablation and on sotolol   BPH (benign prostatic hyperplasia)    Chronic frontal sinusitis 09/12/2014   Chronic insomnia    Chronic sphenoidal sinusitis 09/12/2014   CKD (chronic kidney disease)    Depression    Dyslipidemia 03/18/2015   Essential hypertension 03/18/2015   Fall 12/09/2022   GERD (gastroesophageal reflux disease)    H/O cardiac radiofrequency ablation 08/2016   Hyperlipidemia    Hypertension    Hypertrophy of both inferior nasal turbinates 09/12/2014   Memory loss    Meniere's disease of left ear    Mixed dyslipidemia 10/13/2022   Multiple nasal polyps 09/12/2014   Nasal septal deviation 09/12/2014   PAF (paroxysmal atrial fibrillation) (HCC) 12/17/2020   S/P aorta repair    S/P AVR (aortic valve replacement) 2010   s/p valve-in-valve TAVR 07/17/2019   s/p VIV TAVR via the TA approach after failed TF case 07/03/19   Severe aortic stenosis 05/16/2019   Status post radiofrequency ablation for arrhythmia 03/18/2015  Stroke (HCC) 12/09/2022   TIA (transient ischemic attack)     Past Surgical History:  Procedure Laterality Date   ABLATION OF DYSRHYTHMIC FOCUS     CARDIAC VALVE REPLACEMENT     CHOLECYSTECTOMY     KIDNEY STONE SURGERY     KNEE ARTHROSCOPY Bilateral    MASS EXCISION Right 12/26/2017   Procedure: RIGHT INDEX EXCISION CYST AND DEBRIDEMENT OF DISTAL INTERPHALANGEAL JOINT;  Surgeon: Murrell Drivers, MD;  Location: [facility];  Service: Orthopedics;  Laterality: Right;  Bier  block   RIGHT HEART CATH AND CORONARY ANGIOGRAPHY N/A 06/20/2019   Procedure: RIGHT HEART CATH AND CORONARY ANGIOGRAPHY;  Surgeon: Wonda Sharper, MD;  Location: [facility] INVASIVE CV LAB;  Service: Cardiovascular;  Laterality: N/A;   TEE WITHOUT CARDIOVERSION N/A 07/03/2019   Procedure: TRANSESOPHAGEAL ECHOCARDIOGRAM (TEE);  Surgeon: Wonda Sharper, MD;  Location: [facility] OR;  Service: Open Heart Surgery;  Laterality: N/A;   TEE WITHOUT CARDIOVERSION N/A 07/17/2019   Procedure: TRANSESOPHAGEAL ECHOCARDIOGRAM (TEE);  Surgeon: Wonda Sharper, MD;  Location: [facility] OR;  Service: Open Heart Surgery;  Laterality: N/A;   TRANSCATHETER AORTIC VALVE REPLACEMENT, TRANSAPICAL N/A 07/17/2019   Procedure: TRANSCATHETER AORTIC VALVE REPLACEMENT, TRANSAPICAL;  Surgeon: Wonda Sharper, MD;  Location: [facility] OR;  Service: Open Heart Surgery;  Laterality: N/A;   TRANSCATHETER AORTIC VALVE REPLACEMENT, TRANSFEMORAL N/A 07/03/2019   Procedure: attempted TRANSCATHETER AORTIC VALVE REPLACEMENT, TRANSFEMORAL;  Surgeon: Wonda Sharper, MD;  Location: [facility] OR;  Service: Open Heart Surgery;  Laterality: N/A;    Current Medications: Current Meds  Medication Sig   apixaban  (ELIQUIS ) 5 MG TABS tablet Take 1 tablet (5 mg total) by mouth 2 (two) times daily.   Calcium  Carb-Cholecalciferol (CALCIUM  600 + D PO) Take 1 tablet by mouth 2 (two) times daily.   cyanocobalamin  (VITAMIN B12) 1000 MCG tablet Take 1,000 mcg by mouth daily.   diazepam  (VALIUM ) 2 MG tablet Take 2 mg by mouth 2 (two) times daily.   diltiazem  (CARDIZEM  CD) 120 MG 24 hr capsule Take 1 capsule (120 mg total) by mouth daily.   DULoxetine (CYMBALTA) 60 MG capsule Take 60 mg by mouth daily.   meclizine (ANTIVERT) 25 MG tablet Take 25 mg by mouth 3 (three) times daily as needed for dizziness.   Multiple Vitamins-Minerals (CENTRUM ADULT PO) Take 1 tablet by mouth daily.   Omega-3 Fatty Acids (FISH OIL PO) Take 1 tablet by mouth 2 (two) times daily.   ondansetron  (ZOFRAN -ODT) 4  MG disintegrating tablet Take 4 mg by mouth every 4 (four) hours as needed for nausea or vomiting.   pantoprazole  (PROTONIX ) 40 MG tablet Take 40 mg by mouth daily.   potassium chloride  SA (KLOR-CON  M) 20 MEQ tablet Take 20 mEq by mouth 2 (two) times daily.   rosuvastatin  (CRESTOR ) 10 MG tablet Take 10 mg by mouth daily.   sotalol  (BETAPACE ) 80 MG tablet Take 1 tablet (80 mg total) by mouth 2 (two) times daily.   tamsulosin  (FLOMAX ) 0.4 MG CAPS capsule Take 0.4 mg by mouth at bedtime.   temazepam  (RESTORIL ) 15 MG capsule Take 15 mg by mouth at bedtime.    triamterene -hydrochlorothiazide  (MAXZIDE -25) 37.5-25 MG tablet Take 1 tablet by mouth daily.     Allergies:   Patient has no known allergies.   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 : Never true  Transportation Needs: No Transportation Needs (02/20/2024)   Received from [employer]    In the past 12 months, has lack of reliable transportation kept you from medical appointments, meetings, work or from getting things needed for daily living? : No   Physical Activity: Not on file  Stress: Not on file  Social Connections: Not on file     Family History: The patient's family history includes CAD in his brother.  ROS:   Please see the history of present illness.    All other systems reviewed and are negative.  EKGs/Labs/Other Studies Reviewed:    The following studies were reviewed today: .SABRA  I discussed my findings with patient at length    Recent Labs: No results found for requested labs within last 365 days.  Recent Lipid Panel    Component Value Date/Time   CHOL 131 08/09/2022 1059   TRIG 133 08/09/2022 1059   HDL 42 08/09/2022 1059   CHOLHDL 3.1 08/09/2022 1059   LDLCALC 66 08/09/2022 1059    Physical Exam:    VS:  BP 126/74   Pulse 91   Ht 5' 8 (1.727 m)   Wt 196 lb 10.1 oz (89.2 kg)   SpO2 94%   BMI 29.90 kg/m     Wt Readings from Last 3 Encounters:  04/24/24 196 lb 10.1 oz (89.2 kg)  03/06/24 191 lb 9.6 oz (86.9 kg)  02/10/24 195 lb (88.5 kg)     GEN: Patient is in no acute distress HEENT: Normal NECK: No JVD; No carotid bruits LYMPHATICS: No lymphadenopathy CARDIAC: Hear sounds regular, 2/6 systolic murmur at the apex. RESPIRATORY:  Clear to auscultation without rales, wheezing or rhonchi  ABDOMEN: Soft, non-tender, non-distended MUSCULOSKELETAL:  No edema; No deformity  SKIN: Warm and dry NEUROLOGIC:  Alert and oriented x 3 PSYCHIATRIC:  Normal affect   Signed, Jennifer JONELLE Crape, MD  04/24/2024 3:19 PM    [facility]

## 2024-04-24 NOTE — Patient Instructions (Addendum)
 Medication Instructions:  Your physician recommends that you continue on your current medications as directed. Please refer to the Current Medication list given to you today.  *If you need a refill on your cardiac medications before your next appointment, please call your pharmacy*  Lab Work: Your physician recommends that you return for lab work in:   Labs today: CMP, LFT, Pro BNP, CBC, TSH  If you have labs (blood work) drawn today and your tests are completely normal, you will receive your results only by: MyChart Message (if you have MyChart) OR A paper copy in the mail If you have any lab test that is abnormal or we need to change your treatment, we will call you to review the results.  Testing/Procedures: Your physician has requested that you have an echocardiogram. Echocardiography is a painless test that uses sound waves to create images of your heart. It provides your doctor with information about the size and shape of your heart and how well your heart's chambers and valves are working. This procedure takes approximately one hour. There are no restrictions for this procedure. Please do NOT wear cologne, perfume, aftershave, or lotions (deodorant is allowed). Please arrive 15 minutes prior to your appointment time.  Please note: We ask at that you not bring children with you during ultrasound (echo/ vascular) testing. Due to room size and safety concerns, children are not allowed in the ultrasound rooms during exams. Our front office staff cannot provide observation of children in our lobby area while testing is being conducted. An adult accompanying a patient to their appointment will only be allowed in the ultrasound room at the discretion of the ultrasound technician under special circumstances. We apologize for any inconvenience.     Your cardiac CT will be scheduled at one of the below locations:   [facility] [address] [place], [place] [address] [phone]  OR   [facility] [address] [place], [place] [address] [phone]  OR   [facility] [address] [place], [place] [address] [phone]  OR   [facility] and Vascular Tower [address]  [place], [place] [address]  OR   [facility] [address] [place], [place]  If scheduled at [facility] please arrive at the [facility] and Children's Entrance (Entrance C2) of [facility] 30 minutes prior to test start time. You can use the FREE valet parking offered at entrance C (encouraged to control the heart rate for the test)  Proceed to the [facility] Radiology Department (first floor) to check-in and test prep.  All radiology patients and guests should use entrance C2 at [facility], accessed from [facility], even though the hospital's physical address listed is [address].  If scheduled at the Heart and Vascular Tower at [address], please enter the parking lot using the [address] entrance and use the FREE valet service at the patient drop-off area. Enter the building and check-in with registration on the main floor.  If scheduled at [facility], please arrive to the Heart and Vascular Center 15 mins early for check-in and test prep.  There is spacious parking and easy access to the radiology department from the [facility] V[facility] entrance. Please enter here and check-in with the desk attendant.   If scheduled at [facility], please arrive 30 minutes early for check-in and test prep.  Please follow these  instructions carefully (unless otherwise directed):  An IV will be required for this test and Nitroglycerin  will be given.  Hold all erectile dysfunction medications at least 3 days (72 hrs) prior to test. (Ie viagra, cialis, sildenafil, tadalafil, etc)   On the Night Before the Test: Be sure to Drink plenty of  water. Do not consume any caffeinated/decaffeinated beverages or chocolate 12 hours prior to your test. Do not take any antihistamines 12 hours prior to your test.  On the Day of the Test: Drink plenty of water until 1 hour prior to the test. Do not eat any food 1 hour prior to test. You may take your regular medications prior to the test.  Take metoprolol  (Lopressor ) two hours prior to test. If you take Triamterene  - Hydrochlorothiazide  please HOLD on the morning of the test. Patients who wear a continuous glucose monitor MUST remove the device prior to scanning.      After the Test: Drink plenty of water. After receiving IV contrast, you may experience a mild flushed feeling. This is normal. On occasion, you may experience a mild rash up to 24 hours after the test. This is not dangerous. If this occurs, you can take Benadryl  25 mg, Zyrtec, Claritin , or Allegra and increase your fluid intake. (Patients taking Tikosyn should avoid Benadryl , and may take Zyrtec, Claritin , or Allegra) If you experience trouble breathing, this can be serious. If it is severe call 911 IMMEDIATELY. If it is mild, please call our office.  We will call to schedule your test 2-4 weeks out understanding that some insurance companies will need an authorization prior to the service being performed.   For more information and frequently asked questions, please visit our website : [email or web address]  For non-scheduling related questions, please contact the cardiac imaging nurse navigator should you have any questions/concerns: Cardiac Imaging Nurse Navigators Direct Office Dial: [phone]   For scheduling needs, including cancellations and rescheduling, please call [place], [phone].   Follow-Up: At [facility], you and your health needs are our priority.  As part of our continuing mission to provide you with exceptional heart care, our providers are all part of one team.  This team  includes your primary Cardiologist (physician) and Advanced Practice Providers or APPs (Physician Assistants and Nurse Practitioners) who all work together to provide you with the care you need, when you need it.  Your next appointment:   6 month(s)  Provider:   Jennifer Crape, MD  We recommend signing up for the patient portal called MyChart.  Sign up information is provided on this After Visit Summary.  MyChart is used to connect with patients for Virtual Visits (Telemedicine).  Patients are able to view lab/test results, encounter notes, upcoming appointments, etc.  Non-urgent messages can be sent to your provider as well.   To learn more about what you can do with MyChart, go to [email or web address].   Other Instructions None

## 2024-04-25 ENCOUNTER — Ambulatory Visit: Payer: Self-pay | Admitting: Cardiology

## 2024-04-25 LAB — COMPREHENSIVE METABOLIC PANEL WITH GFR
ALT: 28 IU/L (ref 0–44)
AST: 29 IU/L (ref 0–40)
Albumin: 4.4 g/dL (ref 3.8–4.8)
Alkaline Phosphatase: 70 IU/L (ref 44–121)
BUN/Creatinine Ratio: 15 (ref 10–24)
BUN: 16 mg/dL (ref 8–27)
Bilirubin Total: 1 mg/dL (ref 0.0–1.2)
CO2: 27 mmol/L (ref 20–29)
Calcium: 10.4 mg/dL — ABNORMAL HIGH (ref 8.6–10.2)
Chloride: 97 mmol/L (ref 96–106)
Creatinine, Ser: 1.09 mg/dL (ref 0.76–1.27)
Globulin, Total: 2.4 g/dL (ref 1.5–4.5)
Glucose: 98 mg/dL (ref 70–99)
Potassium: 4.8 mmol/L (ref 3.5–5.2)
Sodium: 139 mmol/L (ref 134–144)
Total Protein: 6.8 g/dL (ref 6.0–8.5)
eGFR: 70 mL/min/1.73 (ref >59)

## 2024-04-25 LAB — CBC
Hematocrit: 53.6 % — ABNORMAL HIGH (ref 37.5–51.0)
Hemoglobin: 17.8 g/dL — ABNORMAL HIGH (ref 13.0–17.7)
MCH: 33.7 pg — ABNORMAL HIGH (ref 26.6–33.0)
MCHC: 33.2 g/dL (ref 31.5–35.7)
MCV: 102 fL — ABNORMAL HIGH (ref 79–97)
Platelets: 198 x10E3/uL (ref 150–450)
RBC: 5.28 x10E6/uL (ref 4.14–5.80)
RDW: 12.5 % (ref 11.6–15.4)
WBC: 11.9 x10E3/uL — ABNORMAL HIGH (ref 3.4–10.8)

## 2024-04-25 LAB — PRO B NATRIURETIC PEPTIDE: NT-Pro BNP: 157 pg/mL (ref 0–486)

## 2024-04-25 LAB — TSH: TSH: 1.93 u[IU]/mL (ref 0.450–4.500)

## 2024-04-25 LAB — HEPATIC FUNCTION PANEL: Bilirubin, Direct: 0.28 mg/dL (ref 0.00–0.40)

## 2024-05-03 ENCOUNTER — Telehealth: Payer: Self-pay

## 2024-05-03 NOTE — Telephone Encounter (Signed)
 scheduled

## 2024-05-03 NOTE — Telephone Encounter (Signed)
 [name] was asking when would his echo be scheduled as he has several appointments upcoming.

## 2024-05-08 ENCOUNTER — Telehealth: Payer: Self-pay | Admitting: Cardiology

## 2024-05-08 DIAGNOSIS — I480 Paroxysmal atrial fibrillation: Secondary | ICD-10-CM | POA: Diagnosis not present

## 2024-05-08 DIAGNOSIS — M47816 Spondylosis without myelopathy or radiculopathy, lumbar region: Secondary | ICD-10-CM | POA: Diagnosis not present

## 2024-05-08 DIAGNOSIS — F5104 Psychophysiologic insomnia: Secondary | ICD-10-CM | POA: Diagnosis not present

## 2024-05-08 DIAGNOSIS — F331 Major depressive disorder, recurrent, moderate: Secondary | ICD-10-CM | POA: Diagnosis not present

## 2024-05-08 DIAGNOSIS — I10 Essential (primary) hypertension: Secondary | ICD-10-CM | POA: Diagnosis not present

## 2024-05-08 DIAGNOSIS — J329 Chronic sinusitis, unspecified: Secondary | ICD-10-CM | POA: Diagnosis not present

## 2024-05-08 DIAGNOSIS — M51379 Other intervertebral disc degeneration, lumbosacral region without mention of lumbar back pain or lower extremity pain: Secondary | ICD-10-CM | POA: Diagnosis not present

## 2024-05-08 DIAGNOSIS — E785 Hyperlipidemia, unspecified: Secondary | ICD-10-CM | POA: Diagnosis not present

## 2024-05-08 DIAGNOSIS — R972 Elevated prostate specific antigen [PSA]: Secondary | ICD-10-CM | POA: Diagnosis not present

## 2024-05-08 NOTE — Telephone Encounter (Signed)
 Patients wife came in with questions about a medication patient was prescribed. He was prescribed diltiazem  24H ER  120 MG and is saying that its made his HR too low and would like a call back to see if he needs to stop taking this medication. CB # [phone]

## 2024-05-08 NOTE — Telephone Encounter (Signed)
 Recommendations reviewed with Randall Edwards per DPR as per Dr. Posey note. Randall Edwards verbalized understanding and had no additional questions.

## 2024-05-08 NOTE — Telephone Encounter (Signed)
 Spoke with [name] per DPR who states that when the pt saw Dr. Keren this am his HR was 44 and BP has been low. Pt is fatigued and Dr. Keren requested the pt check with Dr. Revankar if it needed to be adjusted with these findings. Please advise

## 2024-05-09 ENCOUNTER — Telehealth (HOSPITAL_COMMUNITY): Payer: Self-pay | Admitting: *Deleted

## 2024-05-09 NOTE — Telephone Encounter (Signed)
Reaching out to patient to offer assistance regarding upcoming cardiac imaging study; pt's wife answered phone and verbalizes understanding of appt date/time, parking situation and where to check in, pre-test NPO status and medications ordered, and verified current allergies; name and call back number provided for further questions should they arise  Tyrek Lawhorn RN Navigator Cardiac Imaging [place] [facility] [phone] office [phone] cell  Patient to take 100mg metoprolol tartrate two hours prior to his cardiac CT scan.  

## 2024-05-10 ENCOUNTER — Inpatient Hospital Stay (HOSPITAL_BASED_OUTPATIENT_CLINIC_OR_DEPARTMENT_OTHER)
Admission: RE | Admit: 2024-05-10 | Discharge: 2024-05-10 | Discharge status: Home / Self Care | Source: Ambulatory Visit | Attending: Cardiology | Admitting: Cardiology

## 2024-05-10 DIAGNOSIS — I10 Essential (primary) hypertension: Secondary | ICD-10-CM | POA: Diagnosis not present

## 2024-05-10 DIAGNOSIS — I700 Atherosclerosis of aorta: Secondary | ICD-10-CM | POA: Diagnosis not present

## 2024-05-10 DIAGNOSIS — Z9889 Other specified postprocedural states: Secondary | ICD-10-CM | POA: Diagnosis not present

## 2024-05-10 DIAGNOSIS — I77810 Thoracic aortic ectasia: Secondary | ICD-10-CM | POA: Diagnosis not present

## 2024-05-10 DIAGNOSIS — I480 Paroxysmal atrial fibrillation: Secondary | ICD-10-CM | POA: Diagnosis not present

## 2024-05-10 DIAGNOSIS — E782 Mixed hyperlipidemia: Secondary | ICD-10-CM

## 2024-05-10 DIAGNOSIS — G459 Transient cerebral ischemic attack, unspecified: Secondary | ICD-10-CM | POA: Diagnosis not present

## 2024-05-10 DIAGNOSIS — R072 Precordial pain: Secondary | ICD-10-CM | POA: Diagnosis not present

## 2024-05-10 DIAGNOSIS — Z952 Presence of prosthetic heart valve: Secondary | ICD-10-CM

## 2024-05-10 DIAGNOSIS — Z8679 Personal history of other diseases of the circulatory system: Secondary | ICD-10-CM

## 2024-05-10 MED ORDER — DILTIAZEM HCL 25 MG/5ML IV SOLN: 10.0000 mg | INTRAVENOUS | Status: DC | PRN

## 2024-05-10 MED ORDER — METOPROLOL TARTRATE 5 MG/5ML IV SOLN: 10.0000 mg | Freq: Once | INTRAVENOUS | Status: DC | PRN

## 2024-05-10 MED ORDER — IOHEXOL 350 MG/ML SOLN
95.0000 mL | Freq: Once | INTRAVENOUS | Status: AC | PRN
  Administered 2024-05-10: 95 mL via INTRAVENOUS

## 2024-05-10 MED ORDER — NITROGLYCERIN 0.4 MG SL SUBL
0.8000 mg | SUBLINGUAL_TABLET | Freq: Once | SUBLINGUAL | Status: AC
  Administered 2024-05-10: 0.8 mg via SUBLINGUAL

## 2024-05-13 DIAGNOSIS — F331 Major depressive disorder, recurrent, moderate: Secondary | ICD-10-CM | POA: Diagnosis not present

## 2024-05-13 DIAGNOSIS — I480 Paroxysmal atrial fibrillation: Secondary | ICD-10-CM | POA: Diagnosis not present

## 2024-05-16 DIAGNOSIS — R972 Elevated prostate specific antigen [PSA]: Secondary | ICD-10-CM | POA: Diagnosis not present

## 2024-05-16 DIAGNOSIS — N401 Enlarged prostate with lower urinary tract symptoms: Secondary | ICD-10-CM | POA: Diagnosis not present

## 2024-05-16 DIAGNOSIS — N3941 Urge incontinence: Secondary | ICD-10-CM | POA: Diagnosis not present

## 2024-05-18 ENCOUNTER — Telehealth: Payer: Self-pay

## 2024-05-18 ENCOUNTER — Ambulatory Visit

## 2024-05-18 NOTE — Telephone Encounter (Signed)
Results reviewed with Bonita Quin per DPR as per Dr. Kem Parkinson note. Bonita Quin verbalized understanding and had no additional questions. Routed to PCP.

## 2024-05-22 ENCOUNTER — Ambulatory Visit: Attending: Internal Medicine

## 2024-05-22 ENCOUNTER — Telehealth: Payer: Self-pay

## 2024-05-22 MED ORDER — AMLODIPINE BESYLATE 5 MG PO TABS: 5.0000 mg | ORAL_TABLET | Freq: Every day | ORAL | 3 refills | Status: AC

## 2024-05-22 NOTE — Telephone Encounter (Signed)
 Linda per DPR reports that since stopping Cardizem  pt's diastolic has been in the 100's and this am it was 113. HR has been 80-90. Please advise

## 2024-05-22 NOTE — Telephone Encounter (Signed)
 Recommendations reviewed with Randall Edwards per DPR as per Delon Sexton note. Randall Edwards verbalized understanding and had no additional questions.

## 2024-05-29 ENCOUNTER — Ambulatory Visit: Attending: Cardiology

## 2024-05-29 DIAGNOSIS — Z8679 Personal history of other diseases of the circulatory system: Secondary | ICD-10-CM | POA: Diagnosis not present

## 2024-05-29 DIAGNOSIS — I10 Essential (primary) hypertension: Secondary | ICD-10-CM

## 2024-05-29 DIAGNOSIS — I480 Paroxysmal atrial fibrillation: Secondary | ICD-10-CM | POA: Diagnosis not present

## 2024-05-29 DIAGNOSIS — Z952 Presence of prosthetic heart valve: Secondary | ICD-10-CM

## 2024-05-29 DIAGNOSIS — R0609 Other forms of dyspnea: Secondary | ICD-10-CM | POA: Diagnosis not present

## 2024-05-29 DIAGNOSIS — Z9889 Other specified postprocedural states: Secondary | ICD-10-CM | POA: Diagnosis not present

## 2024-05-29 DIAGNOSIS — G459 Transient cerebral ischemic attack, unspecified: Secondary | ICD-10-CM

## 2024-05-29 DIAGNOSIS — I77810 Thoracic aortic ectasia: Secondary | ICD-10-CM | POA: Diagnosis not present

## 2024-05-29 DIAGNOSIS — E782 Mixed hyperlipidemia: Secondary | ICD-10-CM

## 2024-05-29 DIAGNOSIS — I700 Atherosclerosis of aorta: Secondary | ICD-10-CM | POA: Diagnosis not present

## 2024-05-29 LAB — ECHOCARDIOGRAM COMPLETE
AR max vel: 2.75 cm2
AV Area VTI: 2.8 cm2
AV Area mean vel: 3.2 cm2
AV Mean grad: 13 mmHg
AV Peak grad: 20.2 mmHg
Ao pk vel: 2.25 m/s
Area-P 1/2: 1.81 cm2
MV VTI: 2.99 cm2
S' Lateral: 3.1 cm

## 2024-05-31 ENCOUNTER — Other Ambulatory Visit (HOSPITAL_BASED_OUTPATIENT_CLINIC_OR_DEPARTMENT_OTHER): Admitting: Radiology

## 2024-06-01 ENCOUNTER — Telehealth: Payer: Self-pay

## 2024-06-01 DIAGNOSIS — I77810 Thoracic aortic ectasia: Secondary | ICD-10-CM

## 2024-06-01 NOTE — Telephone Encounter (Signed)
 Results reviewed with pt as per Wallis Bamberg NP's note.  Pt verbalized understanding and had no additional questions. Routed to PCP.

## 2024-06-01 NOTE — Telephone Encounter (Signed)
 [name] is requesting results of recent echo. Please advise

## 2024-06-04 DIAGNOSIS — I480 Paroxysmal atrial fibrillation: Secondary | ICD-10-CM | POA: Diagnosis not present

## 2024-06-04 DIAGNOSIS — I10 Essential (primary) hypertension: Secondary | ICD-10-CM | POA: Diagnosis not present

## 2024-06-04 DIAGNOSIS — R0600 Dyspnea, unspecified: Secondary | ICD-10-CM | POA: Diagnosis not present

## 2024-06-04 DIAGNOSIS — M47816 Spondylosis without myelopathy or radiculopathy, lumbar region: Secondary | ICD-10-CM | POA: Diagnosis not present

## 2024-06-07 DIAGNOSIS — G4733 Obstructive sleep apnea (adult) (pediatric): Secondary | ICD-10-CM | POA: Diagnosis not present

## 2024-06-07 DIAGNOSIS — R400 Somnolence: Secondary | ICD-10-CM | POA: Diagnosis not present

## 2024-06-07 DIAGNOSIS — R0982 Postnasal drip: Secondary | ICD-10-CM | POA: Diagnosis not present

## 2024-06-07 DIAGNOSIS — J4540 Moderate persistent asthma, uncomplicated: Secondary | ICD-10-CM | POA: Diagnosis not present

## 2024-06-10 ENCOUNTER — Other Ambulatory Visit: Payer: Self-pay | Admitting: Cardiology

## 2024-06-12 DIAGNOSIS — F331 Major depressive disorder, recurrent, moderate: Secondary | ICD-10-CM | POA: Diagnosis not present

## 2024-06-12 DIAGNOSIS — N401 Enlarged prostate with lower urinary tract symptoms: Secondary | ICD-10-CM | POA: Diagnosis not present

## 2024-06-13 DIAGNOSIS — M47816 Spondylosis without myelopathy or radiculopathy, lumbar region: Secondary | ICD-10-CM | POA: Diagnosis not present

## 2024-06-15 DIAGNOSIS — L820 Inflamed seborrheic keratosis: Secondary | ICD-10-CM | POA: Diagnosis not present

## 2024-06-15 DIAGNOSIS — D225 Melanocytic nevi of trunk: Secondary | ICD-10-CM | POA: Diagnosis not present

## 2024-06-27 DIAGNOSIS — R188 Other ascites: Secondary | ICD-10-CM | POA: Diagnosis not present

## 2024-06-27 DIAGNOSIS — I480 Paroxysmal atrial fibrillation: Secondary | ICD-10-CM | POA: Diagnosis not present

## 2024-06-27 DIAGNOSIS — I10 Essential (primary) hypertension: Secondary | ICD-10-CM | POA: Diagnosis not present

## 2024-06-29 DIAGNOSIS — R188 Other ascites: Secondary | ICD-10-CM | POA: Diagnosis not present

## 2024-06-29 DIAGNOSIS — K760 Fatty (change of) liver, not elsewhere classified: Secondary | ICD-10-CM | POA: Diagnosis not present

## 2024-07-02 DIAGNOSIS — Z9049 Acquired absence of other specified parts of digestive tract: Secondary | ICD-10-CM | POA: Diagnosis not present

## 2024-07-02 DIAGNOSIS — K760 Fatty (change of) liver, not elsewhere classified: Secondary | ICD-10-CM | POA: Diagnosis not present

## 2024-07-02 DIAGNOSIS — R188 Other ascites: Secondary | ICD-10-CM | POA: Diagnosis not present

## 2024-07-09 DIAGNOSIS — B9689 Other specified bacterial agents as the cause of diseases classified elsewhere: Secondary | ICD-10-CM | POA: Diagnosis not present

## 2024-07-09 DIAGNOSIS — J208 Acute bronchitis due to other specified organisms: Secondary | ICD-10-CM | POA: Diagnosis not present

## 2024-07-12 ENCOUNTER — Other Ambulatory Visit: Payer: Self-pay | Admitting: Cardiology

## 2024-07-12 DIAGNOSIS — I700 Atherosclerosis of aorta: Secondary | ICD-10-CM

## 2024-07-12 NOTE — Telephone Encounter (Signed)
 Prescription refill request for Eliquis  received. Indication:afib Last office visit:8/25 Scr:1.09  8/25 Age: 78 Weight:89.2  kg  Prescription refilled

## 2024-07-13 DIAGNOSIS — I10 Essential (primary) hypertension: Secondary | ICD-10-CM | POA: Diagnosis not present

## 2024-07-13 DIAGNOSIS — M47816 Spondylosis without myelopathy or radiculopathy, lumbar region: Secondary | ICD-10-CM | POA: Diagnosis not present

## 2024-07-17 DIAGNOSIS — R351 Nocturia: Secondary | ICD-10-CM | POA: Diagnosis not present

## 2024-07-17 DIAGNOSIS — R399 Unspecified symptoms and signs involving the genitourinary system: Secondary | ICD-10-CM | POA: Diagnosis not present

## 2024-07-17 DIAGNOSIS — N401 Enlarged prostate with lower urinary tract symptoms: Secondary | ICD-10-CM | POA: Diagnosis not present

## 2024-07-17 DIAGNOSIS — R3915 Urgency of urination: Secondary | ICD-10-CM | POA: Diagnosis not present

## 2024-07-25 DIAGNOSIS — R0982 Postnasal drip: Secondary | ICD-10-CM | POA: Diagnosis not present

## 2024-07-25 DIAGNOSIS — G4733 Obstructive sleep apnea (adult) (pediatric): Secondary | ICD-10-CM | POA: Diagnosis not present

## 2024-07-25 DIAGNOSIS — R5383 Other fatigue: Secondary | ICD-10-CM | POA: Diagnosis not present

## 2024-07-25 DIAGNOSIS — J4540 Moderate persistent asthma, uncomplicated: Secondary | ICD-10-CM | POA: Diagnosis not present

## 2024-08-11 ENCOUNTER — Other Ambulatory Visit: Payer: Self-pay | Admitting: Cardiology

## 2024-08-13 DIAGNOSIS — J4541 Moderate persistent asthma with (acute) exacerbation: Secondary | ICD-10-CM | POA: Diagnosis not present

## 2024-08-16 DIAGNOSIS — F5104 Psychophysiologic insomnia: Secondary | ICD-10-CM | POA: Diagnosis not present

## 2024-08-16 DIAGNOSIS — I480 Paroxysmal atrial fibrillation: Secondary | ICD-10-CM | POA: Diagnosis not present

## 2024-08-16 DIAGNOSIS — I10 Essential (primary) hypertension: Secondary | ICD-10-CM | POA: Diagnosis not present

## 2024-08-16 DIAGNOSIS — F331 Major depressive disorder, recurrent, moderate: Secondary | ICD-10-CM | POA: Diagnosis not present

## 2024-08-16 DIAGNOSIS — R972 Elevated prostate specific antigen [PSA]: Secondary | ICD-10-CM | POA: Diagnosis not present

## 2024-08-16 DIAGNOSIS — M51379 Other intervertebral disc degeneration, lumbosacral region without mention of lumbar back pain or lower extremity pain: Secondary | ICD-10-CM | POA: Diagnosis not present

## 2024-08-16 DIAGNOSIS — M47816 Spondylosis without myelopathy or radiculopathy, lumbar region: Secondary | ICD-10-CM | POA: Diagnosis not present

## 2024-08-16 DIAGNOSIS — E785 Hyperlipidemia, unspecified: Secondary | ICD-10-CM | POA: Diagnosis not present

## 2024-10-23 ENCOUNTER — Ambulatory Visit: Admitting: Cardiology
